# Patient Record
Sex: Female | Born: 1975
Health system: Southern US, Community
[De-identification: ages and names within clinical notes are randomized; demographics above are authoritative.]

## PROBLEM LIST (undated history)

## (undated) ENCOUNTER — Emergency Department (HOSPITAL_COMMUNITY): Admission: EM | Discharge status: Absent without leave | Payer: BLUE CROSS/BLUE SHIELD

## (undated) DIAGNOSIS — F32A Depression, unspecified: Secondary | ICD-10-CM

## (undated) DIAGNOSIS — F329 Major depressive disorder, single episode, unspecified: Secondary | ICD-10-CM

## (undated) DIAGNOSIS — R87619 Unspecified abnormal cytological findings in specimens from cervix uteri: Secondary | ICD-10-CM

## (undated) DIAGNOSIS — F419 Anxiety disorder, unspecified: Secondary | ICD-10-CM

## (undated) HISTORY — DX: Major depressive disorder, single episode, unspecified: F32.9

## (undated) HISTORY — DX: Depression, unspecified: F32.A

## (undated) HISTORY — PX: COLPOSCOPY: SHX161

## (undated) HISTORY — DX: Unspecified abnormal cytological findings in specimens from cervix uteri: R87.619

## (undated) HISTORY — PX: TUBAL LIGATION: SHX77

## (undated) HISTORY — DX: Anxiety disorder, unspecified: F41.9

---

## 1997-12-01 ENCOUNTER — Emergency Department (HOSPITAL_COMMUNITY): Admission: EM | Admit: 1997-12-01 | Discharge: 1997-12-01 | Payer: Self-pay | Admitting: Emergency Medicine

## 1998-11-04 ENCOUNTER — Emergency Department (HOSPITAL_COMMUNITY): Admission: EM | Admit: 1998-11-04 | Discharge: 1998-11-04 | Payer: Self-pay | Admitting: *Deleted

## 1999-03-24 ENCOUNTER — Other Ambulatory Visit: Admission: RE | Admit: 1999-03-24 | Discharge: 1999-03-24 | Payer: Self-pay | Admitting: Internal Medicine

## 1999-11-03 ENCOUNTER — Other Ambulatory Visit: Admission: RE | Admit: 1999-11-03 | Discharge: 1999-11-03 | Payer: Self-pay | Admitting: Obstetrics and Gynecology

## 2000-05-14 ENCOUNTER — Observation Stay (HOSPITAL_COMMUNITY): Admission: AD | Admit: 2000-05-14 | Discharge: 2000-05-15 | Payer: Self-pay | Admitting: Obstetrics and Gynecology

## 2000-05-15 ENCOUNTER — Encounter: Payer: Self-pay | Admitting: Obstetrics and Gynecology

## 2000-06-04 ENCOUNTER — Inpatient Hospital Stay (HOSPITAL_COMMUNITY): Admission: AD | Admit: 2000-06-04 | Discharge: 2000-06-04 | Payer: Self-pay | Admitting: Obstetrics and Gynecology

## 2000-06-05 ENCOUNTER — Inpatient Hospital Stay (HOSPITAL_COMMUNITY): Admission: AD | Admit: 2000-06-05 | Discharge: 2000-06-05 | Payer: Self-pay | Admitting: Obstetrics and Gynecology

## 2000-06-08 ENCOUNTER — Inpatient Hospital Stay (HOSPITAL_COMMUNITY): Admission: AD | Admit: 2000-06-08 | Discharge: 2000-06-10 | Payer: Self-pay | Admitting: Obstetrics and Gynecology

## 2001-03-09 ENCOUNTER — Other Ambulatory Visit: Admission: RE | Admit: 2001-03-09 | Discharge: 2001-03-09 | Payer: Self-pay | Admitting: Family Medicine

## 2001-10-29 ENCOUNTER — Inpatient Hospital Stay (HOSPITAL_COMMUNITY): Admission: AD | Admit: 2001-10-29 | Discharge: 2001-10-29 | Payer: Self-pay | Admitting: Obstetrics and Gynecology

## 2001-10-31 ENCOUNTER — Inpatient Hospital Stay (HOSPITAL_COMMUNITY): Admission: AD | Admit: 2001-10-31 | Discharge: 2001-10-31 | Payer: Self-pay | Admitting: Obstetrics and Gynecology

## 2002-04-25 ENCOUNTER — Other Ambulatory Visit: Admission: RE | Admit: 2002-04-25 | Discharge: 2002-04-25 | Payer: Self-pay | Admitting: *Deleted

## 2003-01-09 ENCOUNTER — Inpatient Hospital Stay (HOSPITAL_COMMUNITY): Admission: AD | Admit: 2003-01-09 | Discharge: 2003-01-09 | Payer: Self-pay | Admitting: Obstetrics & Gynecology

## 2003-10-01 ENCOUNTER — Emergency Department (HOSPITAL_COMMUNITY): Admission: EM | Admit: 2003-10-01 | Discharge: 2003-10-01 | Payer: Self-pay | Admitting: Emergency Medicine

## 2003-10-02 ENCOUNTER — Emergency Department (HOSPITAL_COMMUNITY): Admission: EM | Admit: 2003-10-02 | Discharge: 2003-10-02 | Payer: Self-pay | Admitting: Emergency Medicine

## 2003-11-10 ENCOUNTER — Inpatient Hospital Stay (HOSPITAL_COMMUNITY): Admission: AD | Admit: 2003-11-10 | Discharge: 2003-11-10 | Payer: Self-pay | Admitting: Obstetrics and Gynecology

## 2004-02-25 ENCOUNTER — Emergency Department (HOSPITAL_COMMUNITY): Admission: EM | Admit: 2004-02-25 | Discharge: 2004-02-25 | Payer: Self-pay | Admitting: Emergency Medicine

## 2004-03-22 ENCOUNTER — Emergency Department (HOSPITAL_COMMUNITY): Admission: EM | Admit: 2004-03-22 | Discharge: 2004-03-22 | Payer: Self-pay | Admitting: Emergency Medicine

## 2005-01-23 ENCOUNTER — Inpatient Hospital Stay (HOSPITAL_COMMUNITY): Admission: AD | Admit: 2005-01-23 | Discharge: 2005-01-23 | Payer: Self-pay | Admitting: Family Medicine

## 2005-01-25 ENCOUNTER — Inpatient Hospital Stay (HOSPITAL_COMMUNITY): Admission: AD | Admit: 2005-01-25 | Discharge: 2005-01-25 | Payer: Self-pay | Admitting: *Deleted

## 2005-09-14 ENCOUNTER — Ambulatory Visit: Payer: Self-pay | Admitting: Obstetrics & Gynecology

## 2005-10-11 ENCOUNTER — Inpatient Hospital Stay (HOSPITAL_COMMUNITY): Admission: AD | Admit: 2005-10-11 | Discharge: 2005-10-11 | Payer: Self-pay | Admitting: Obstetrics and Gynecology

## 2005-11-10 ENCOUNTER — Ambulatory Visit: Payer: Self-pay | Admitting: Obstetrics & Gynecology

## 2005-11-13 ENCOUNTER — Inpatient Hospital Stay (HOSPITAL_COMMUNITY): Admission: AD | Admit: 2005-11-13 | Discharge: 2005-11-13 | Payer: Self-pay | Admitting: Obstetrics & Gynecology

## 2005-11-23 ENCOUNTER — Ambulatory Visit: Payer: Self-pay | Admitting: Obstetrics & Gynecology

## 2005-11-23 ENCOUNTER — Encounter (INDEPENDENT_AMBULATORY_CARE_PROVIDER_SITE_OTHER): Payer: Self-pay | Admitting: Specialist

## 2006-01-30 ENCOUNTER — Inpatient Hospital Stay (HOSPITAL_COMMUNITY): Admission: AD | Admit: 2006-01-30 | Discharge: 2006-01-30 | Payer: Self-pay | Admitting: Gynecology

## 2007-09-02 ENCOUNTER — Emergency Department (HOSPITAL_COMMUNITY): Admission: EM | Admit: 2007-09-02 | Discharge: 2007-09-02 | Payer: Self-pay | Admitting: Family Medicine

## 2007-09-05 ENCOUNTER — Inpatient Hospital Stay (HOSPITAL_COMMUNITY): Admission: AD | Admit: 2007-09-05 | Discharge: 2007-09-05 | Payer: Self-pay | Admitting: Obstetrics and Gynecology

## 2007-11-16 ENCOUNTER — Inpatient Hospital Stay (HOSPITAL_COMMUNITY): Admission: AD | Admit: 2007-11-16 | Discharge: 2007-11-16 | Payer: Self-pay | Admitting: Obstetrics and Gynecology

## 2007-12-04 ENCOUNTER — Inpatient Hospital Stay (HOSPITAL_COMMUNITY): Admission: AD | Admit: 2007-12-04 | Discharge: 2007-12-08 | Payer: Self-pay | Admitting: Obstetrics and Gynecology

## 2007-12-05 ENCOUNTER — Encounter (INDEPENDENT_AMBULATORY_CARE_PROVIDER_SITE_OTHER): Payer: Self-pay | Admitting: Obstetrics and Gynecology
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
## 2010-08-10 NOTE — Op Note (Signed)
NAMEJESSAMY, TOROSYAN                ACCOUNT NO.:  [id number]   MEDICAL RECORD NO.:  [id number]          PATIENT TYPE:  INP   LOCATION:  9132                          [facility]   PHYSICIAN:  Crist Fat. Rivard, M.D. DATE OF BIRTH:  05-Jul-1975   DATE OF PROCEDURE:  12/05/2007  DATE OF DISCHARGE:                               OPERATIVE REPORT   PREOPERATIVE DIAGNOSES:  1. Intrauterine pregnancy at 38 weeks and 3 days.  2. Oligohydramnios.  3. Failure to progress.  4. Nonreassuring fetal heart rate and desire for sterilization.   POSTOPERATIVE DIAGNOSES:  1. Intrauterine pregnancy at 38 weeks and 3 days.  2. Oligohydramnios.  3. Failure to progress.  4. Nonreassuring fetal heart rate and desire for sterilization.   ANESTHESIA:  Epidural, Dr. Arby Barrette   PROCEDURE:  Primary low transverse cesarean section plus bilateral tubal  ligation.   SURGEON:  Crist Fat. Rivard, MD   ASSISTANT:  Wynelle Bourgeois, certified nurse midwife.   ESTIMATED BLOOD LOSS:  800 mL.   PROCEDURE:  After being informed of the planned procedure with possible  complications including bleeding, infection, injury to bowel, bladder or  ureters as well as irreversibility and failure of sterilization of 1 in  500, informed consent is obtained.  The patient is taken to OR #1 and  preexisting epidural anesthesia is reinforced.  The patient is placed in  the dorsal decubitus position, pelvis tilted to the left and baby is  monitored while we prepare for C-section and heart rate has recuperated  in the 120.  The patient is then prepped and draped in a sterile fashion  and a Foley catheter is already in her bladder.  After assessing  adequate level of anesthesia, we infiltrated the suprapubic area using  Marcaine 0.25%, 20 mL.  We then perform a Pfannenstiel incision which  was brought down sharply to the fascia.  The fascia is incised in a low  transverse fashion, linea alba is dissected, and peritoneum is entered  in the midline fashion.  The visceral peritoneum is entered in low  transverse fashion allowing [place] to safely retract bladder by developing a  bladder flap.  Alexis retractor is easily placed and we can perform a  uterine incision in a low transverse fashion first with knife and then  extended bluntly.  Amniotic fluid is clear.  We assist the birth of a  female infant at 9:16 p.m. in LOA presentation.  Mouth and nose were  suctioned.  Baby is delivered.  Cord is clamped with two Kelly clamps  and sectioned and the baby is given to Dr. Katrinka Blazing, neonatologist present  in the room.  Placenta is allowed to deliver spontaneously.  The  complete cord has three vessels.  It is sent for cord blood donation and  then to pathology for oligohydramnios.  Uterine revision is negative.   The uterus was then closed in two layers first with a running lock  suture of 0 Vicryl, then with a Lembert suture of 0 Vicryl imbricating  the first one.  Hemostasis is completed in the right angle with three  figure-of-eight stitches of 0 Vicryl and with cauterization on  peritoneal edges.  Both paracolic gutters are cleaned.  Both tubes and  ovaries are assessed and normal and the pelvis was profusely irrigated  with warm saline to note a satisfactory hemostasis.  We then proceeded  with our tubal ligation, first on the left then on the right in the same  fashion:  The tube is initially grasped with 2 Babcock forceps.  Mesosalpinx was entered with cauterization.  Each tube is doubly ligated  in its proximal section and its distal section with 0 chromic.  A  section of 1.5 cm of isthmal ampullary tube is then sectioned out and  each stump is cauterized.  Hemostasis is adequate.  We profusely  irrigated the pelvis again with warm saline and noticed satisfactory  hemostasis.  Retractors are removed and under fascia hemostasis is  completed with cautery.  The fascia is closed with 2 running sutures of  1 Vicryl meeting  midline.  The wound is irrigated with warm saline and  hemostasis is completed with cautery.  The skin is closed with  subcuticular suture of 3-0 Monocryl and Steri-Strips.   Instrument and sponge count is complete x2.  Estimated blood loss is 800  mL.  The procedure was very well tolerated by the patient who is taken  to recovery room in a well and stable condition.   SPECIMEN:  Placenta and cord sent to cord blood donation then to  pathology and segment of right and left tube sent to pathology.   Little boy named Fritzi Mandes was born at 9:16 p.m., received an Apgar of 9  at 1 minute and 9 at 5-minute and weigh 8 pounds 3 ounces.      Crist Fat Rivard, M.D.  Electronically Signed     SAR/MEDQ  D:  12/05/2007  T:  12/06/2007  Job:  [id number]

## 2010-08-10 NOTE — Discharge Summary (Signed)
NAMESHARON, [name]                ACCOUNT NO.:  [id number]   MEDICAL RECORD NO.:  [id number]          PATIENT TYPE:  INP   LOCATION:  9132                          [facility]   PHYSICIAN:  Crist Fat. Rivard, M.D. DATE OF BIRTH:  1975/06/03   DATE OF ADMISSION:  12/04/2007  DATE OF DISCHARGE:  12/08/2007                               DISCHARGE SUMMARY   ADMISSION DIAGNOSES:  1. Intrauterine pregnancy at 38-2/7th weeks.  2. Oligohydramnios.  3. Induction of labor.   DISCHARGE DIAGNOSES:  1. Intrauterine pregnancy at 6 and 3/7th weeks, delivered.  2. Nonreassuring fetal heart rate tracing.  3. Failure to progress.  4. Desire for sterilization.   PROCEDURES:  1. Primary low transverse cesarean section.  2. Tubal sterilization.   HOSPITAL COURSE:  [name] is a 34 year old gravida 3, para 1-0-1-1,  who was admitted at 38-2/7th weeks gestation for induction of labor due  to oligohydramnios.  The patient is with no history or symptoms of PIH.  The patient's pregnancy remarkable for:  1. History of abnormal Pap.  2. Short stature.  3. Increased body mass index.  4. Desires sterilization.  5. Positive Group B strep.   The patient was admitted to birthing suites.  The patient's labor was  initially started with Foley bulb ripening.  The patient had Foley bulb  placed on the evening of admission December 04, 2007.  Pitocin was  started on December 04, 2007.  Pitocin was started on December 05, 2007.  The patient's labor progressed to  6-7 cm.  The patient's fetal heart  rate with decelerations on December 05, 2007 at approximately 6:45,  which resolved with discontinuation of Pitocin.  However, when Pitocin  was restarted patient with recurrent fetal heart rate decelerations.  At  that time, cervical edema was noted on exam and cervical exam had  regressed  to 5 cm.   The patient was taken for primary low transverse cesarean section and  delivered a viable female infant,  [place] at the 9:16 p.m., December 05, 2007.  Apgars 9 and 9 at 1 and 5 minutes respectively.  Weight 8 pounds  3 ounces.  Surgery was uncomplicated.  Bilateral tubal ligation was also  performed at that time.  Estimated blood loss 800 mL.   The patient's postoperative course was uneventful.  The patient's day  one hemoglobin 8.9 and pre delivery was 10.4.  The patient remained  asymptomatic with anemia.  On postoperative day #3, the patient was  ambulating, voiding, and tolerating solids and liquids without  difficulty.  The patient was breast feeding without difficulty.  It was  felt that the patient had received a full benefit of her hospital stay  and was discharged home.   DISCHARGE INSTRUCTIONS:  Per [facility] handout.   DISCHARGE MEDICATIONS:  1. Motrin 600 mg p.o. q.6 h. p.r.n. pain.  2. Tylox 1-2 tablets p.o. q. 4-6h. p.r.n. pain.  3. Integra Plus iron supplement 1 tablet p.o. daily.   Discharge followup will occur in 6 weeks at [facility].  Jade [name], CNM      ______________________________  Crist Fat Rivard, M.D.    NOS/MEDQ  D:  12/08/2007  T:  12/08/2007  Job:  [id number]

## 2010-08-13 NOTE — Group Therapy Note (Signed)
NAME:  Jade [name], Jade [name] NO.:  [id number]   MEDICAL RECORD NO.:  [id number]          PATIENT TYPE:  WOC   LOCATION:  [facility]                   [facility]   PHYSICIAN:  Elsie Lincoln, MD      DATE OF BIRTH:  May 18, 1975   DATE OF SERVICE:  11/23/2005                                    CLINIC NOTE   The patient is a 35 year old female who presents for reevaluation of  abnormal Pap smear.  The patient has a history of low-grade Pap and biopsy  with an unsatisfactory colposcopy.  It has been six months since her last  Pap smear.  I have talked to the patient in depth today that it is time to  reevaluate possibly.  Since the colposcopy was done at the health department  that I might be able to see more, given my depth of experience.  The patient  agrees to do this conservative approach instead of going to a LEEP.  The  colposcopy at this time was performed, and the entire transformation zone  was seen.  There was one biopsy taken in a small acetowhite area  approximately at 12 o'clock.  She did have a negative pregnancy test prior  to this.  The patient is to come back in two weeks for results, or I will  probably call her actually with the results, given that she has had such a  difficult process getting through our system.           ______________________________  Elsie Lincoln, MD     KL/MEDQ  D:  11/30/2005  T:  12/01/2005  Job:  [id number]

## 2010-08-13 NOTE — Group Therapy Note (Signed)
NAMESHERLEEN, PANGBORN NO.:  [id number]   MEDICAL RECORD NO.:  [id number]          PATIENT TYPE:  WOC   LOCATION:  [facility]                   [facility]   PHYSICIAN:  Elsie Lincoln, MD      DATE OF BIRTH:  1976-03-18   DATE OF SERVICE:                                    CLINIC NOTE   CORRECTION:  The patient is a 35 year old female who presents for re-  evaluation of abnormal Pap smear.  The patient has a history of a low grade  Pap and biopsy with unsatisfactory colposcopy and it has been six months  since her last Pap smear.  I talked to the patient in depth today, that it  is time to re-evaluate possibly.  Since colposcopy was done at the health  department, I think I might be able to see more given my depth of  experience.  The patient agrees to do this conservative approach instead of  going to LEEP.  The colposcopy at the time was performed and the entire T-  zone was seen.  There was no biopsy taken because there was no abnormal area  seen.  A Pap smear was done.  I will call the patient with the results.           ______________________________  Elsie Lincoln, MD     KL/MEDQ  D:  12/14/2005  T:  12/15/2005  Job:  [id number]
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
## 2010-08-13 NOTE — H&P (Signed)
[facility]  Patient:    Jade [name], Jade [name]                         MRN: [id number] Adm. Date:  [id number] Disc. Date: [id number] Attending:  Shaune Spittle Dictator:   Miguel Dibble, C.N.M.                         History and Physical  DATE OF BIRTH:  1975-08-24.  BRIEF HISTORY:  This is a 35 year old African-American female prima gravida at 61 and 3/[redacted] weeks pregnant who has been having contractions since 2 a.m. ( approximately 13 hours ago).  She has ambulated since be checked in the office and has gone from 1 to 2 cm and 80% effaced at approximately 11:30 a.m. to 3-4 cm 95% effaced, vertex -1 with marked bloody show at approximately 1500 hours. She will be admitted for labor management.  LABORATORY DATA:  Her prenatal labs are as follows:  hemoglobin 13.5, hematocrit 39.4, platelets 274, blood type and Rh 0+, Rh antibody negative, sickle cell trail negative.  VDRL nonreactive.  Rubella titer immune. Hepatitis B surface antigen negative, HIV nonreactive.  Pap smear within normal limits.  Gonorrhea and chlamydia cultures negative.  Maternal serum alpha protein within normal limits.  Glucose challenge test at 28 weeks is 104, group B streptococcus at 36 weeks is negative.  This pregnancy was uncomplicated.  She received Procardia for a short time for signs of early preterm labor that subsided quickly.  MEDICAL HISTORY:  No known drug allergies.  Usual childhood diseases.  Wisdom tooth extraction in 1999.  FAMILY HISTORY:  Maternal uncle with polycystic kidney disease as well as maternal grandfather with polycystic kidney disease.  Mother with cocaine abuse. Genetic history is negative.  SOCIAL HISTORY:  African-American female, single, father of the baby is Annamaria Boots who is present and supportive.  The patient has received 1 year of college.  She is currently unemployed.  Father of the baby has had 1 year of college.  Works for [employer]  full time. Stable monogamous relationship.  Denies smoking, alcohol or drug abuse.  PHYSICAL EXAMINATION:  HEENT:  Within normal limits.  LUNGS:  Bilaterally clear.  HEART:  Regular rate and rhythm.  ABDOMEN:  Soft, nontender.  Contractions every 2-3 minutes.  Fetal heart rate is reactive and reassuring.  CERVIX:  Cervix is 3-4 cm, 95% effaced, vertex at -1 with bloody show.  Intact bag of waters.  EXTREMITIES:  Trace pedal edema.  DTRs +1.  ASSESSMENT:  Prima gravida, post dates and early active phase labor.  Negative group B streptococcus.  PLAN:  Admit to Labor and Delivery.  Routine [facility] orders. Analgesia.  Anticipate normal spontaneous vaginal delivery.  Her ultrasound confirmed VDC of May 29, 2000. DD:  06/08/00 TD:  06/08/00 Job: [id number]

## 2010-11-08 ENCOUNTER — Emergency Department (HOSPITAL_COMMUNITY)
Admission: EM | Admit: 2010-11-08 | Discharge: 2010-11-08 | Disposition: A | Discharge status: Home / Self Care | Payer: Self-pay | Attending: Emergency Medicine | Admitting: Emergency Medicine

## 2010-11-08 DIAGNOSIS — R209 Unspecified disturbances of skin sensation: Secondary | ICD-10-CM | POA: Insufficient documentation

## 2010-12-23 LAB — URINALYSIS, ROUTINE W REFLEX MICROSCOPIC
Bilirubin Urine: NEGATIVE
Glucose, UA: NEGATIVE
Hgb urine dipstick: NEGATIVE
Ketones, ur: NEGATIVE
Nitrite: NEGATIVE
Protein, ur: NEGATIVE
Specific Gravity, Urine: 1.01
Urobilinogen, UA: 0.2
pH: 7.5

## 2010-12-29 LAB — CBC
HCT: 27.5 — ABNORMAL LOW
HCT: 32.2 — ABNORMAL LOW
Hemoglobin: 10.4 — ABNORMAL LOW
MCHC: 32.4
MCHC: 32.5
MCV: 83.9
Platelets: 111 — ABNORMAL LOW
RDW: 14.6
RDW: 14.7

## 2011-08-01 ENCOUNTER — Encounter: Payer: Self-pay | Admitting: Advanced Practice Midwife

## 2011-08-01 ENCOUNTER — Ambulatory Visit (INDEPENDENT_AMBULATORY_CARE_PROVIDER_SITE_OTHER): Payer: Self-pay | Admitting: Advanced Practice Midwife

## 2011-08-01 VITALS — BP 111/73 | HR 86 | Temp 98.7°F | Ht 61.0 in | Wt 204.1 lb

## 2011-08-01 DIAGNOSIS — Z87898 Personal history of other specified conditions: Secondary | ICD-10-CM

## 2011-08-01 DIAGNOSIS — N940 Mittelschmerz: Secondary | ICD-10-CM

## 2011-08-01 DIAGNOSIS — R87613 High grade squamous intraepithelial lesion on cytologic smear of cervix (HGSIL): Secondary | ICD-10-CM | POA: Insufficient documentation

## 2011-08-01 DIAGNOSIS — Z124 Encounter for screening for malignant neoplasm of cervix: Secondary | ICD-10-CM

## 2011-08-01 DIAGNOSIS — R1031 Right lower quadrant pain: Secondary | ICD-10-CM

## 2011-08-01 DIAGNOSIS — Z01419 Encounter for gynecological examination (general) (routine) without abnormal findings: Secondary | ICD-10-CM

## 2011-08-01 DIAGNOSIS — Z8742 Personal history of other diseases of the female genital tract: Secondary | ICD-10-CM

## 2011-08-01 LAB — POCT URINALYSIS DIP (DEVICE)
Bilirubin Urine: NEGATIVE
Glucose, UA: NEGATIVE mg/dL
Nitrite: NEGATIVE
pH: 8.5 — ABNORMAL HIGH (ref 5.0–8.0)

## 2011-08-01 LAB — CBC
MCHC: 33 g/dL (ref 30.0–36.0)
Platelets: 270 10*3/uL (ref 150–400)
RDW: 13 % (ref 11.5–15.5)

## 2011-08-01 NOTE — Progress Notes (Signed)
States here for annual checkup/pap smear . States has history abnormal papsmear and colposcopy, and follow up pap was normal.  Also c/o pain right lower side x 4 days=6.

## 2011-08-01 NOTE — Progress Notes (Signed)
Subjective:     Jade [name] is a 36 y.o. woman who comes in today for a  pap smear only. Her most recent annual exam was 1 year ago. Her most recent Pap smear was one year ago and showed no abnormalities per pt. Previous abnormal Pap smears: yes - CIN II w/ normal colpo per pt.  Contraception: tubal ligation Patient's last menstrual period was 07/17/2011.   The pt reports RLQ pain x 4 days, increased clear discharge and possible light spotting. She denies fever, chills, N/V/D/C, loss of appetite, dysuria, dyspareunia, frequency, flank pain or hematuria. She has not tried anything for the pain.  The following portions of the patient's history were reviewed and updated as appropriate: allergies, current medications, past family history, past medical history, past social history, past surgical history and problem list.  Review of Systems Pertinent items are noted in HPI.   Objective:    BP 111/73  Pulse 86  Temp 98.7 F (37.1 C)  Ht 5\' 1"  (1.549 m)  Wt 204 lb 1.6 oz (92.579 kg)  BMI 38.56 kg/m2  LMP 07/17/2011 GENERAL: NAD, A&OX4 ABD: Soft, mild tenderness in right groin extending cephalad. No rebound, guarding or masses. Normal BS. No CVAT. Pelvic Exam: cervix normal in appearance, exam obscured by obesity, external genitalia normal, no bladder tenderness, no cervical motion tenderness, positive findings: Nabothian cyst(s) or mild right adnexal tenderness and clear, odorless discharge C/W ovulation. and no bleeding, vervix non-friable. Pap smear obtained.   Assessment:   1. Cervical cancer screening  Cytology - PAP  2. RLQ abdominal pain (low suspicion for appendicitis) CBC, POCT urine pregnancy, GC/CT, UA  3. History of abnormal Pap smear     4. Mittelschmerz      Plan:    Follow up in 1 year, or as indicated by Pap results.  Ibuprofen 600 mg PO Q6 PRN If no relief of Sx in 2 days and no other explanation of pain will order pelvic US. If Sx worsen and/or fever, GI Sx develop,  go to ED.  Dorathy Kinsman 08/01/2011 3:40 PM

## 2011-08-01 NOTE — Patient Instructions (Signed)
Ibuprofen 600 mg every 6 hours  Mittelschmerz  Mittelschmerz is lower abdominal pain that happens between menstrual periods. Mittelschmerz is a [place] word that means "middle pain." It may occur right before, during, or after ovulation. It is usually felt on either the right or left side, depending on which ovary is passing the egg.  CAUSES  Pain may be felt when:  There is irritation (inflammation) inside the abdomen. This is caused by the small amount of blood or fluid that may come from releasing the egg.   The covering of the ovary stretches.   Ovarian cysts develop.   You have endometriosis. This is when the uterine lining tissue grows outside of the uterus.   You have endometriomas. These are cysts that are formed by endometrial tissue.  SYMPTOMS  Pain may be:  One-sided pain unless both ovaries are ovulating at the same time. If both ovaries are ovulating, there may be pain on both sides. This pain is often repeated every month. At times, there may be a month or two with no pain.   Dull, cramping, or sharp.   Short-lived or last up to 24 to 48 hours.   Felt with bowel movements, diarrhea, or intercourse.   Accompanied by a slight amount of vaginal bleeding.  DIAGNOSIS   Your caregiver will take a history and do a physical exam.   Blood tests and abdominal ultrasounds may be performed if the problem continues, becomes worse, or does not respond to the usual treatment.   A thin, lighted tube may be put into your abdomen (laparoscopy) to check for problems if the pain gets worse or does not go away.  TREATMENT  Usually, no treatment is needed. If treatment is needed, it may include:  Taking over-the-counter pain relievers.   Taking birth control pills (oral contraceptives). This may be used to stop ovulation.   Medical or surgical treatment if you have endometriomas.  Together, you and your caregiver can decide which course of treatment is best for you. HOME CARE  INSTRUCTIONS   Only take over-the-counter or prescription medicines for pain, discomfort, or fever as directed by your caregiver. Do not use aspirin. Aspirin may increase bleeding.   Write down when the pain comes in relation to your menstrual period. Write down how bad it is, if you have a fever with the pain, and how long it lasts.  SEEK MEDICAL CARE IF:   Your pain increases and is not controlled with medicine.   Your pain is on both sides of your abdomen.   You develop vaginal bleeding (more than just spotting) with the pain.   You have a fever.   You develop nausea or vomiting.   You feel lightheaded or faint.  MAKE SURE YOU:   Understand these instructions.   Will watch your condition.   Will get help right away if you are not doing well or get worse.  Document Released: 03/04/2002 Document Revised: 03/03/2011 Document Reviewed: 06/11/2010 [facility] Patient Information 2012 [place], [place].

## 2011-08-03 ENCOUNTER — Telehealth: Payer: Self-pay | Admitting: *Deleted

## 2011-08-03 NOTE — Telephone Encounter (Signed)
Spoke with patient advised her that her cbc was normal. And pap smear results are not back

## 2011-08-03 NOTE — Telephone Encounter (Signed)
Pt left a message requesting her results. 

## 2011-08-03 NOTE — Telephone Encounter (Signed)
Returned patients call no answer, left her a message that we are returning her call.

## 2011-08-09 ENCOUNTER — Telehealth: Payer: Self-pay | Admitting: *Deleted

## 2011-08-09 NOTE — Telephone Encounter (Signed)
Message copied by Jill Side on Tue Aug 09, 2011  3:23 PM ------      Message from: Raynald Blend      Created: Tue Aug 09, 2011 11:56 AM       Appointment 06/13 @ 1:45                        ----- Message -----         From: Dorathy Kinsman, CNM         Sent: 08/09/2011   8:37 AM           To: [facility] Admin Pool            Pap ASCUS pos HRHPV. Needs colpo.

## 2011-08-09 NOTE — Telephone Encounter (Signed)
Called pt and left message to call back regarding results of Pap and follow up appt.

## 2011-08-10 NOTE — Telephone Encounter (Signed)
Called pt and left message to return our call to the clinics.  

## 2011-08-11 NOTE — Telephone Encounter (Signed)
Pt called the front desk and Erie Noe transferred the call.  The pt stated that she was returning our call.  I informed the pt of abnormal pap results with HPV detected pt stated that she has had abnormal pap before and that we were scheduling her for a colposcopy.  Pt stated that she did not have a pen and paper but I could call and leave the appt time on her voicemail, 09/08/11@ 145pm.  I called and left appt time on voicemail along with contact # if the appt is not in her convenience.

## 2011-08-11 NOTE — Telephone Encounter (Signed)
Called pt and left message to return our call to the clinics.  

## 2011-08-11 NOTE — Telephone Encounter (Signed)
Called patient at her mobile and home numbers and left messages requesting a call back regarding an upcoming appt.

## 2011-09-08 ENCOUNTER — Encounter: Payer: Self-pay | Admitting: Physician Assistant

## 2011-10-07 ENCOUNTER — Encounter: Payer: Self-pay | Admitting: Obstetrics & Gynecology

## 2012-09-21 ENCOUNTER — Ambulatory Visit (INDEPENDENT_AMBULATORY_CARE_PROVIDER_SITE_OTHER): Payer: Self-pay | Admitting: Obstetrics and Gynecology

## 2012-09-21 ENCOUNTER — Encounter: Payer: Self-pay | Admitting: Obstetrics and Gynecology

## 2012-09-21 VITALS — BP 102/68 | HR 70 | Temp 98.1°F | Ht 61.0 in | Wt 203.4 lb

## 2012-09-21 DIAGNOSIS — N760 Acute vaginitis: Secondary | ICD-10-CM

## 2012-09-21 DIAGNOSIS — Z01419 Encounter for gynecological examination (general) (routine) without abnormal findings: Secondary | ICD-10-CM

## 2012-09-21 NOTE — Progress Notes (Signed)
  Subjective:     Jade [name] is a 37 y.o. female [phone] with LMP 09/12/2012 and BMI 38 who is here for a comprehensive physical exam. The patient reports malodorous vaginal discharge which has been present for the past month. She denies any pruritis. Patient reports that she was not aware of her last pap smear being abnormal (ASCUS +HPV). Patient otherwise without any complaints. She is sexually active using BTL for contraception.  Past Medical History  Diagnosis Date  . Abnormal Pap smear of cervix    Past Surgical History  Procedure Laterality Date  . Colposcopy    . Cesarean section    . Tubal ligation     Family History  Problem Relation Age of Onset  . Hypertension Mother   . Diabetes Mother    History  Substance Use Topics  . Smoking status: Never Smoker   . Smokeless tobacco: Never Used  . Alcohol Use: No    History   Social History  . Marital Status: Single    Spouse Name: N/A    Number of Children: N/A  . Years of Education: N/A   Occupational History  . Not on file.   Social History Main Topics  . Smoking status: Never Smoker   . Smokeless tobacco: Never Used  . Alcohol Use: No  . Drug Use: No  . Sexually Active: Yes    Birth Control/ Protection: Surgical   Other Topics Concern  . Not on file   Social History Narrative  . No narrative on file   Health Maintenance  Topic Date Due  . Tetanus/tdap  08/19/1996  . Influenza Vaccine  11/26/2012  . Pap Smear  08/01/2014       Review of Systems A comprehensive review of systems was negative.   Objective:      GENERAL: Well-developed, well-nourished female in no acute distress.  HEENT: Normocephalic, atraumatic. Sclerae anicteric.  NECK: Supple. Normal thyroid.  LUNGS: Clear to auscultation bilaterally.  HEART: Regular rate and rhythm. BREASTS: Symmetric in size. No palpable masses or lymphadenopathy, skin changes, or nipple drainage. ABDOMEN: Soft, nontender, nondistended. No  organomegaly. PELVIC: Normal external female genitalia. Vagina is pink and rugated.  Normal discharge. Normal appearing cervix. Uterus is 8-weeks in size. No adnexal mass or tenderness. EXTREMITIES: No cyanosis, clubbing, or edema, 2+ distal pulses.    Assessment:    Healthy female exam.      Plan:    pap smear collected and wet prep - Patient advised to perform monthly self breast and vulva exams - Patient advised to exercise regularly (150 minutes/week) and start low fat high fiber diet to help with weight loss - Patient will be contacted with any abnormal results - RTC in 1 year or prn See After Visit Summary for Counseling Recommendations

## 2012-09-21 NOTE — Patient Instructions (Signed)
Preventive Care for Adults, Female A healthy lifestyle and preventive care can promote health and wellness. Preventive health guidelines for women include the following key practices.  A routine yearly physical is a good way to check with your caregiver about your health and preventive screening. It is a chance to share any concerns and updates on your health, and to receive a thorough exam.  Visit your dentist for a routine exam and preventive care every 6 months. Brush your teeth twice a day and floss once a day. Good oral hygiene prevents tooth decay and gum disease.  The frequency of eye exams is based on your age, health, family medical history, use of contact lenses, and other factors. Follow your caregiver's recommendations for frequency of eye exams.  Eat a healthy diet. Foods like vegetables, fruits, whole grains, low-fat dairy products, and lean protein foods contain the nutrients you need without too many calories. Decrease your intake of foods high in solid fats, added sugars, and salt. Eat the right amount of calories for you.Get information about a proper diet from your caregiver, if necessary.  Regular physical exercise is one of the most important things you can do for your health. Most adults should get at least 150 minutes of moderate-intensity exercise (any activity that increases your heart rate and causes you to sweat) each week. In addition, most adults need muscle-strengthening exercises on 2 or more days a week.  Maintain a healthy weight. The body mass index (BMI) is a screening tool to identify possible weight problems. It provides an estimate of body fat based on height and weight. Your caregiver can help determine your BMI, and can help you achieve or maintain a healthy weight.For adults 20 years and older:  A BMI below 18.5 is considered underweight.  A BMI of 18.5 to 24.9 is normal.  A BMI of 25 to 29.9 is considered overweight.  A BMI of 30 and above is  considered obese.  Maintain normal blood lipids and cholesterol levels by exercising and minimizing your intake of saturated fat. Eat a balanced diet with plenty of fruit and vegetables. Blood tests for lipids and cholesterol should begin at age 20 and be repeated every 5 years. If your lipid or cholesterol levels are high, you are over 50, or you are at high risk for heart disease, you may need your cholesterol levels checked more frequently.Ongoing high lipid and cholesterol levels should be treated with medicines if diet and exercise are not effective.  If you smoke, find out from your caregiver how to quit. If you do not use tobacco, do not start.  If you are pregnant, do not drink alcohol. If you are breastfeeding, be very cautious about drinking alcohol. If you are not pregnant and choose to drink alcohol, do not exceed 1 drink per day. One drink is considered to be 12 ounces (355 mL) of beer, 5 ounces (148 mL) of wine, or 1.5 ounces (44 mL) of liquor.  Avoid use of street drugs. Do not share needles with anyone. Ask for help if you need support or instructions about stopping the use of drugs.  High blood pressure causes heart disease and increases the risk of stroke. Your blood pressure should be checked at least every 1 to 2 years. Ongoing high blood pressure should be treated with medicines if weight loss and exercise are not effective.  If you are 55 to 35 years old, ask your caregiver if you should take aspirin to prevent strokes.  Diabetes   screening involves taking a blood sample to check your fasting blood sugar level. This should be done once every 3 years, after age 45, if you are within normal weight and without risk factors for diabetes. Testing should be considered at a younger age or be carried out more frequently if you are overweight and have at least 1 risk factor for diabetes.  Breast cancer screening is essential preventive care for women. You should practice "breast  self-awareness." This means understanding the normal appearance and feel of your breasts and may include breast self-examination. Any changes detected, no matter how small, should be reported to a caregiver. Women in their 20s and 30s should have a clinical breast exam (CBE) by a caregiver as part of a regular health exam every 1 to 3 years. After age 40, women should have a CBE every year. Starting at age 40, women should consider having a mammography (breast X-ray test) every year. Women who have a family history of breast cancer should talk to their caregiver about genetic screening. Women at a high risk of breast cancer should talk to their caregivers about having magnetic resonance imaging (MRI) and a mammography every year.  The Pap test is a screening test for cervical cancer. A Pap test can show cell changes on the cervix that might become cervical cancer if left untreated. A Pap test is a procedure in which cells are obtained and examined from the lower end of the uterus (cervix).  Women should have a Pap test starting at age 21.  Between ages 21 and 29, Pap tests should be repeated every 2 years.  Beginning at age 30, you should have a Pap test every 3 years as long as the past 3 Pap tests have been normal.  Some women have medical problems that increase the chance of getting cervical cancer. Talk to your caregiver about these problems. It is especially important to talk to your caregiver if a new problem develops soon after your last Pap test. In these cases, your caregiver may recommend more frequent screening and Pap tests.  The above recommendations are the same for women who have or have not gotten the vaccine for human papillomavirus (HPV).  If you had a hysterectomy for a problem that was not cancer or a condition that could lead to cancer, then you no longer need Pap tests. Even if you no longer need a Pap test, a regular exam is a good idea to make sure no other problems are  starting.  If you are between ages 65 and 70, and you have had normal Pap tests going back 10 years, you no longer need Pap tests. Even if you no longer need a Pap test, a regular exam is a good idea to make sure no other problems are starting.  If you have had past treatment for cervical cancer or a condition that could lead to cancer, you need Pap tests and screening for cancer for at least 20 years after your treatment.  If Pap tests have been discontinued, risk factors (such as a new sexual partner) need to be reassessed to determine if screening should be resumed.  The HPV test is an additional test that may be used for cervical cancer screening. The HPV test looks for the virus that can cause the cell changes on the cervix. The cells collected during the Pap test can be tested for HPV. The HPV test could be used to screen women aged 30 years and older, and should   be used in women of any age who have unclear Pap test results. After the age of 30, women should have HPV testing at the same frequency as a Pap test.  Colorectal cancer can be detected and often prevented. Most routine colorectal cancer screening begins at the age of 50 and continues through age 75. However, your caregiver may recommend screening at an earlier age if you have risk factors for colon cancer. On a yearly basis, your caregiver may provide home test kits to check for hidden blood in the stool. Use of a small camera at the end of a tube, to directly examine the colon (sigmoidoscopy or colonoscopy), can detect the earliest forms of colorectal cancer. Talk to your caregiver about this at age 50, when routine screening begins. Direct examination of the colon should be repeated every 5 to 10 years through age 75, unless early forms of pre-cancerous polyps or small growths are found.  Hepatitis C blood testing is recommended for all people born from 1945 through 1965 and any individual with known risks for hepatitis C.  Practice  safe sex. Use condoms and avoid high-risk sexual practices to reduce the spread of sexually transmitted infections (STIs). STIs include gonorrhea, chlamydia, syphilis, trichomonas, herpes, HPV, and human immunodeficiency virus (HIV). Herpes, HIV, and HPV are viral illnesses that have no cure. They can result in disability, cancer, and death. Sexually active women aged 25 and younger should be checked for chlamydia. Older women with new or multiple partners should also be tested for chlamydia. Testing for other STIs is recommended if you are sexually active and at increased risk.  Osteoporosis is a disease in which the bones lose minerals and strength with aging. This can result in serious bone fractures. The risk of osteoporosis can be identified using a bone density scan. Women ages 65 and over and women at risk for fractures or osteoporosis should discuss screening with their caregivers. Ask your caregiver whether you should take a calcium supplement or vitamin D to reduce the rate of osteoporosis.  Menopause can be associated with physical symptoms and risks. Hormone replacement therapy is available to decrease symptoms and risks. You should talk to your caregiver about whether hormone replacement therapy is right for you.  Use sunscreen with sun protection factor (SPF) of 30 or more. Apply sunscreen liberally and repeatedly throughout the day. You should seek shade when your shadow is shorter than you. Protect yourself by wearing long sleeves, pants, a wide-brimmed hat, and sunglasses year round, whenever you are outdoors.  Once a month, do a whole body skin exam, using a mirror to look at the skin on your back. Notify your caregiver of new moles, moles that have irregular borders, moles that are larger than a pencil eraser, or moles that have changed in shape or color.  Stay current with required immunizations.  Influenza. You need a dose every fall (or winter). The composition of the flu vaccine  changes each year, so being vaccinated once is not enough.  Pneumococcal polysaccharide. You need 1 to 2 doses if you smoke cigarettes or if you have certain chronic medical conditions. You need 1 dose at age 65 (or older) if you have never been vaccinated.  Tetanus, diphtheria, pertussis (Tdap, Td). Get 1 dose of Tdap vaccine if you are younger than age 65, are over 65 and have contact with an infant, are a healthcare worker, are pregnant, or simply want to be protected from whooping cough. After that, you need a Td   booster dose every 10 years. Consult your caregiver if you have not had at least 3 tetanus and diphtheria-containing shots sometime in your life or have a deep or dirty wound.  HPV. You need this vaccine if you are a woman age 26 or younger. The vaccine is given in 3 doses over 6 months.  Measles, mumps, rubella (MMR). You need at least 1 dose of MMR if you were born in 1957 or later. You may also need a second dose.  Meningococcal. If you are age 19 to 21 and a first-year college student living in a residence hall, or have one of several medical conditions, you need to get vaccinated against meningococcal disease. You may also need additional booster doses.  Zoster (shingles). If you are age 60 or older, you should get this vaccine.  Varicella (chickenpox). If you have never had chickenpox or you were vaccinated but received only 1 dose, talk to your caregiver to find out if you need this vaccine.  Hepatitis A. You need this vaccine if you have a specific risk factor for hepatitis A virus infection or you simply wish to be protected from this disease. The vaccine is usually given as 2 doses, 6 to 18 months apart.  Hepatitis B. You need this vaccine if you have a specific risk factor for hepatitis B virus infection or you simply wish to be protected from this disease. The vaccine is given in 3 doses, usually over 6 months. Preventive Services / Frequency Ages 19 to 39  Blood  pressure check.** / Every 1 to 2 years.  Lipid and cholesterol check.** / Every 5 years beginning at age 20.  Clinical breast exam.** / Every 3 years for women in their 20s and 30s.  Pap test.** / Every 2 years from ages 21 through 29. Every 3 years starting at age 30 through age 65 or 70 with a history of 3 consecutive normal Pap tests.  HPV screening.** / Every 3 years from ages 30 through ages 65 to 70 with a history of 3 consecutive normal Pap tests.  Hepatitis C blood test.** / For any individual with known risks for hepatitis C.  Skin self-exam. / Monthly.  Influenza immunization.** / Every year.  Pneumococcal polysaccharide immunization.** / 1 to 2 doses if you smoke cigarettes or if you have certain chronic medical conditions.  Tetanus, diphtheria, pertussis (Tdap, Td) immunization. / A one-time dose of Tdap vaccine. After that, you need a Td booster dose every 10 years.  HPV immunization. / 3 doses over 6 months, if you are 26 and younger.  Measles, mumps, rubella (MMR) immunization. / You need at least 1 dose of MMR if you were born in 1957 or later. You may also need a second dose.  Meningococcal immunization. / 1 dose if you are age 19 to 21 and a first-year college student living in a residence hall, or have one of several medical conditions, you need to get vaccinated against meningococcal disease. You may also need additional booster doses.  Varicella immunization.** / Consult your caregiver.  Hepatitis A immunization.** / Consult your caregiver. 2 doses, 6 to 18 months apart.  Hepatitis B immunization.** / Consult your caregiver. 3 doses usually over 6 months. Ages 40 to 64  Blood pressure check.** / Every 1 to 2 years.  Lipid and cholesterol check.** / Every 5 years beginning at age 20.  Clinical breast exam.** / Every year after age 40.  Mammogram.** / Every year beginning at age 40   and continuing for as long as you are in good health. Consult with your  caregiver.  Pap test.** / Every 3 years starting at age 30 through age 65 or 70 with a history of 3 consecutive normal Pap tests.  HPV screening.** / Every 3 years from ages 30 through ages 65 to 70 with a history of 3 consecutive normal Pap tests.  Fecal occult blood test (FOBT) of stool. / Every year beginning at age 50 and continuing until age 75. You may not need to do this test if you get a colonoscopy every 10 years.  Flexible sigmoidoscopy or colonoscopy.** / Every 5 years for a flexible sigmoidoscopy or every 10 years for a colonoscopy beginning at age 50 and continuing until age 75.  Hepatitis C blood test.** / For all people born from 1945 through 1965 and any individual with known risks for hepatitis C.  Skin self-exam. / Monthly.  Influenza immunization.** / Every year.  Pneumococcal polysaccharide immunization.** / 1 to 2 doses if you smoke cigarettes or if you have certain chronic medical conditions.  Tetanus, diphtheria, pertussis (Tdap, Td) immunization.** / A one-time dose of Tdap vaccine. After that, you need a Td booster dose every 10 years.  Measles, mumps, rubella (MMR) immunization. / You need at least 1 dose of MMR if you were born in 1957 or later. You may also need a second dose.  Varicella immunization.** / Consult your caregiver.  Meningococcal immunization.** / Consult your caregiver.  Hepatitis A immunization.** / Consult your caregiver. 2 doses, 6 to 18 months apart.  Hepatitis B immunization.** / Consult your caregiver. 3 doses, usually over 6 months. Ages 65 and over  Blood pressure check.** / Every 1 to 2 years.  Lipid and cholesterol check.** / Every 5 years beginning at age 20.  Clinical breast exam.** / Every year after age 40.  Mammogram.** / Every year beginning at age 40 and continuing for as long as you are in good health. Consult with your caregiver.  Pap test.** / Every 3 years starting at age 30 through age 65 or 70 with a 3  consecutive normal Pap tests. Testing can be stopped between 65 and 70 with 3 consecutive normal Pap tests and no abnormal Pap or HPV tests in the past 10 years.  HPV screening.** / Every 3 years from ages 30 through ages 65 or 70 with a history of 3 consecutive normal Pap tests. Testing can be stopped between 65 and 70 with 3 consecutive normal Pap tests and no abnormal Pap or HPV tests in the past 10 years.  Fecal occult blood test (FOBT) of stool. / Every year beginning at age 50 and continuing until age 75. You may not need to do this test if you get a colonoscopy every 10 years.  Flexible sigmoidoscopy or colonoscopy.** / Every 5 years for a flexible sigmoidoscopy or every 10 years for a colonoscopy beginning at age 50 and continuing until age 75.  Hepatitis C blood test.** / For all people born from 1945 through 1965 and any individual with known risks for hepatitis C.  Osteoporosis screening.** / A one-time screening for women ages 65 and over and women at risk for fractures or osteoporosis.  Skin self-exam. / Monthly.  Influenza immunization.** / Every year.  Pneumococcal polysaccharide immunization.** / 1 dose at age 65 (or older) if you have never been vaccinated.  Tetanus, diphtheria, pertussis (Tdap, Td) immunization. / A one-time dose of Tdap vaccine if you are over   65 and have contact with an infant, are a healthcare worker, or simply want to be protected from whooping cough. After that, you need a Td booster dose every 10 years.  Varicella immunization.** / Consult your caregiver.  Meningococcal immunization.** / Consult your caregiver.  Hepatitis A immunization.** / Consult your caregiver. 2 doses, 6 to 18 months apart.  Hepatitis B immunization.** / Check with your caregiver. 3 doses, usually over 6 months. ** Family history and personal history of risk and conditions may change your caregiver's recommendations. Document Released: 05/10/2001 Document Revised: 06/06/2011  Document Reviewed: 08/09/2010 [employer] Patient Information 2014 [employer].  

## 2012-09-22 LAB — WET PREP, GENITAL
Trich, Wet Prep: NONE SEEN
Yeast Wet Prep HPF POC: NONE SEEN

## 2012-09-23 ENCOUNTER — Other Ambulatory Visit: Payer: Self-pay | Admitting: Obstetrics and Gynecology

## 2012-09-23 MED ORDER — METRONIDAZOLE 500 MG PO TABS: 500.0000 mg | ORAL_TABLET | Freq: Two times a day (BID) | ORAL | Status: DC

## 2012-09-24 ENCOUNTER — Telehealth: Payer: Self-pay | Admitting: *Deleted

## 2012-09-24 NOTE — Telephone Encounter (Signed)
Pt informed

## 2012-09-24 NOTE — Telephone Encounter (Signed)
Message copied by Mannie Stabile on Mon Sep 24, 2012  8:46 AM ------      Message from: Catalina Antigua      Created: Sun Sep 23, 2012  6:07 AM       Please inform patient of positive BV. Flagyl has been e-prescribed            Peggy ------

## 2012-09-25 ENCOUNTER — Telehealth: Payer: Self-pay | Admitting: General Practice

## 2012-09-25 NOTE — Telephone Encounter (Signed)
Called patient and informed her of results and need for colposcopy and that ou July schedule is full at the moment and she should call back in 3 weeks for an appt in August with Dr Jolayne Panther. Patient verbalized understanding to all and had no further questions

## 2012-09-25 NOTE — Telephone Encounter (Signed)
Message copied by Kathee Delton on Tue Sep 25, 2012  2:34 PM ------      Message from: CONSTANT, Gigi Gin      Created: Tue Sep 25, 2012  2:13 PM       Please inform patient of abnormal pap smear and need for colposcopy. ------

## 2012-10-22 ENCOUNTER — Telehealth: Payer: Self-pay | Admitting: *Deleted

## 2012-10-22 MED ORDER — FLUCONAZOLE 150 MG PO TABS: 150.0000 mg | ORAL_TABLET | Freq: Once | ORAL | Status: DC

## 2012-10-22 NOTE — Telephone Encounter (Signed)
Thera called and left a message she was seen about 3 weeks ago by Dr. Jolayne Panther and states she put her on an antibiotic for bacteria infection and wants to know if it cause a yeast infection or if it could be something else.  Also wants results.

## 2012-10-22 NOTE — Telephone Encounter (Signed)
Called pt and pt wanted to know if the antibiotic that she is taken can cause a yeast infection. I informed pt that yes it can.  I informed her that I would e-presrcibe diflucan a one time dose that she help with the yeast infection.  Verifed pharmacy with the pt.  Pt stated understanding.

## 2012-10-25 ENCOUNTER — Telehealth: Payer: Self-pay

## 2012-10-25 NOTE — Telephone Encounter (Signed)
Pt called and stated "that I got a routine pap in which I received an antibiotic for Bacterial infection and something for a yeast infection.  I am still having a discharge with an odor and I am now having spotting for a couple days.  I am wondering if I can have an [place] cause I am having pain I did have cysts." Called pt and I verified with pt what is stated above.  I informed pt that her abnormal cells on her pap nor cysts could be related to her discharge. But sometimes antibiotics can cause some spotting.   I asked pt did she have a hx of cysts she informed me "no".  Pt stated that all this has just started occuring a month and half ago.  I advised pt to take showers instead of soaking in a tube, to try an keep that area dry, no fragrant things down there.  Pt stated understanding and I clarified with her appt for her colpo on 12/07/12.

## 2012-12-07 ENCOUNTER — Encounter: Payer: Self-pay | Admitting: Obstetrics and Gynecology

## 2013-01-10 ENCOUNTER — Encounter: Payer: Self-pay | Admitting: Family Medicine

## 2013-01-28 ENCOUNTER — Ambulatory Visit (INDEPENDENT_AMBULATORY_CARE_PROVIDER_SITE_OTHER): Payer: Self-pay | Admitting: Obstetrics and Gynecology

## 2013-01-28 ENCOUNTER — Other Ambulatory Visit (HOSPITAL_COMMUNITY)
Admission: RE | Admit: 2013-01-28 | Discharge: 2013-01-28 | Disposition: A | Discharge status: Home / Self Care | Payer: Self-pay | Source: Ambulatory Visit | Attending: Obstetrics and Gynecology | Admitting: Obstetrics and Gynecology

## 2013-01-28 ENCOUNTER — Encounter: Payer: Self-pay | Admitting: Obstetrics and Gynecology

## 2013-01-28 VITALS — BP 117/77 | HR 69 | Temp 97.5°F | Ht 61.0 in | Wt 211.3 lb

## 2013-01-28 DIAGNOSIS — IMO0001 Reserved for inherently not codable concepts without codable children: Secondary | ICD-10-CM | POA: Insufficient documentation

## 2013-01-28 DIAGNOSIS — Z01812 Encounter for preprocedural laboratory examination: Secondary | ICD-10-CM

## 2013-01-28 DIAGNOSIS — R87611 Atypical squamous cells cannot exclude high grade squamous intraepithelial lesion on cytologic smear of cervix (ASC-H): Secondary | ICD-10-CM

## 2013-01-28 DIAGNOSIS — N871 Moderate cervical dysplasia: Secondary | ICD-10-CM | POA: Insufficient documentation

## 2013-01-28 NOTE — Progress Notes (Signed)
Patient ID: Jade [name], female   DOB: November 05, 1975, 37 y.o.   MRN: [id number] 37 yo with ASC-H on 09/21/2012 pap smear here for colposcopy  Patient given informed consent, signed copy in the chart, time out was performed.  Placed in lithotomy position. Cervix viewed with speculum and colposcope after application of acetic acid.   Colposcopy adequate?  TZ not fully visualized Acetowhite lesions?yes at 4 o'clock Punctation?no Mosaicism?  no Abnormal vasculature?  no Biopsies?yes 4 o'clock ECC?yes  COMMENTS: Patient was given post procedure instructions.  She will return in 2 weeks for results.

## 2013-02-05 ENCOUNTER — Telehealth: Payer: Self-pay

## 2013-02-05 NOTE — Telephone Encounter (Signed)
Called pt and left message to return call to the clinics.  Re: Pt has an appt scheduled on 02/08/13 @ 1245 and is a 30 min slot.  Pt can keep appt and do colpo.

## 2013-02-05 NOTE — Telephone Encounter (Signed)
Message copied by Faythe Casa on Tue Feb 05, 2013  4:36 PM ------      Message from: Catalina Antigua      Created: Tue Feb 05, 2013  4:14 PM      Regarding: needs LEEP       PLease inform patient of abnormal colposcopy results and need for LEEP             Thanks            Peggy ------

## 2013-02-06 NOTE — Telephone Encounter (Signed)
Patient called and left message stating she is returning a phone call from [place]

## 2013-02-06 NOTE — Telephone Encounter (Addendum)
Pt called the front desk and I informed pt that her colpo results were abnormal and that we would need her to the LEEP.  Pt stated understanding.  Erie Noe will get with her for an appt.

## 2013-02-11 ENCOUNTER — Ambulatory Visit: Payer: Self-pay | Admitting: Obstetrics and Gynecology

## 2013-02-25 ENCOUNTER — Encounter: Payer: Self-pay | Admitting: *Deleted

## 2013-03-18 ENCOUNTER — Encounter: Payer: Self-pay | Admitting: Obstetrics and Gynecology

## 2013-03-18 ENCOUNTER — Ambulatory Visit (INDEPENDENT_AMBULATORY_CARE_PROVIDER_SITE_OTHER): Payer: Self-pay | Admitting: Obstetrics and Gynecology

## 2013-03-18 ENCOUNTER — Other Ambulatory Visit (HOSPITAL_COMMUNITY)
Admission: RE | Admit: 2013-03-18 | Discharge: 2013-03-18 | Disposition: A | Discharge status: Home / Self Care | Payer: Self-pay | Source: Ambulatory Visit | Attending: Obstetrics and Gynecology | Admitting: Obstetrics and Gynecology

## 2013-03-18 VITALS — BP 109/70 | HR 93 | Ht 61.0 in | Wt 208.8 lb

## 2013-03-18 DIAGNOSIS — N879 Dysplasia of cervix uteri, unspecified: Secondary | ICD-10-CM | POA: Insufficient documentation

## 2013-03-18 DIAGNOSIS — R6889 Other general symptoms and signs: Secondary | ICD-10-CM

## 2013-03-18 DIAGNOSIS — IMO0001 Reserved for inherently not codable concepts without codable children: Secondary | ICD-10-CM

## 2013-03-18 LAB — POCT PREGNANCY, URINE: Preg Test, Ur: NEGATIVE

## 2013-03-18 NOTE — Progress Notes (Signed)
Patient ID: Jade [name], female   DOB: 05-Apr-1975, 37 y.o.   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  Pap smear and colposcopy reviewed.   Pap ASC-H  Colpo Biopsy CIN 2-3 ECC negative Teflon coated speculum with smoke evacuator placed.  Cervix visualized. Paracervical block placed.  Medium size LOOP used to remove cone of cervix using blend of cut and cautery on LEEP machine.  Edges/Base cauterized with Ball.  Monsel's solution used for hemostasis.  Patient tolerated procedure well.  Patient given post procedure instructions.  Follow up in 6 months for repeat pap or as needed.

## 2013-03-19 ENCOUNTER — Encounter: Payer: Self-pay | Admitting: *Deleted

## 2013-03-25 ENCOUNTER — Telehealth: Payer: Self-pay | Admitting: *Deleted

## 2013-03-25 NOTE — Telephone Encounter (Signed)
Pt called nurse line requesting advice for heavy bleeding one week after LEEP procedure with Dr. Jolayne Panther.

## 2013-03-26 NOTE — Telephone Encounter (Signed)
Called pt. Stating we are calling to return her call and inform her of results please call clinic.

## 2013-03-26 NOTE — Telephone Encounter (Signed)
Message copied by Louanna Raw on Tue Mar 26, 2013 11:22 AM ------      Message from: CONSTANT, PEGGY      Created: Wed Mar 20, 2013  8:34 AM       Please inform patient that specimen removed during LEEP appears to contain all abnormal cells. Patient needs to keep appointment to follow up in 6 months for repeat pap smear            Thanks            Peggy ------

## 2013-03-27 NOTE — Telephone Encounter (Signed)
Pt. Called front desk. Spoke with patient and informed her of tissue from leep being abnormal. Informed pt. That she will have a repeat pap in 6 months. Pt. States she Will call in May for appointment in June for f/u pap smear. Asked pt. If she was still having bleeding as she had called on the 29th. Pt stated she it had only lasted for a day and is no longer having any issues. Pt. Verbalizes understanding of results and gratitude and has no other questions or concerns.

## 2013-03-28 HISTORY — PX: CERVICAL BIOPSY  W/ LOOP ELECTRODE EXCISION: SUR135

## 2013-09-12 ENCOUNTER — Encounter (HOSPITAL_COMMUNITY): Payer: Self-pay | Admitting: *Deleted

## 2013-09-12 ENCOUNTER — Inpatient Hospital Stay (HOSPITAL_COMMUNITY)
Admission: AD | Admit: 2013-09-12 | Discharge: 2013-09-12 | Disposition: A | Discharge status: Home / Self Care | Payer: Self-pay | Source: Ambulatory Visit | Attending: Obstetrics & Gynecology | Admitting: Obstetrics & Gynecology

## 2013-09-12 DIAGNOSIS — R109 Unspecified abdominal pain: Secondary | ICD-10-CM | POA: Insufficient documentation

## 2013-09-12 DIAGNOSIS — N939 Abnormal uterine and vaginal bleeding, unspecified: Secondary | ICD-10-CM

## 2013-09-12 DIAGNOSIS — N898 Other specified noninflammatory disorders of vagina: Secondary | ICD-10-CM | POA: Insufficient documentation

## 2013-09-12 DIAGNOSIS — N926 Irregular menstruation, unspecified: Secondary | ICD-10-CM

## 2013-09-12 LAB — POCT PREGNANCY, URINE: PREG TEST UR: NEGATIVE

## 2013-09-12 MED ORDER — MEDROXYPROGESTERONE ACETATE 10 MG PO TABS
10.0000 mg | ORAL_TABLET | Freq: Every day | ORAL | Status: DC
Start: 2013-09-12 — End: 2014-06-23

## 2013-09-12 NOTE — Discharge Instructions (Signed)
Abnormal Uterine Bleeding Abnormal uterine bleeding can affect women at various stages in life, including teenagers, women in their reproductive years, pregnant women, and women who have reached menopause. Several kinds of uterine bleeding are considered abnormal, including:  Bleeding or spotting between periods.   Bleeding after sexual intercourse.   Bleeding that is heavier or more than normal.   Periods that last longer than usual.  Bleeding after menopause.  Many cases of abnormal uterine bleeding are minor and simple to treat, while others are more serious. Any type of abnormal bleeding should be evaluated by your health care provider. Treatment will depend on the cause of the bleeding. HOME CARE INSTRUCTIONS Monitor your condition for any changes. The following actions may help to alleviate any discomfort you are experiencing:  Avoid the use of tampons and douches as directed by your health care provider.  Change your pads frequently. You should get regular pelvic exams and Pap tests. Keep all follow-up appointments for diagnostic tests as directed by your health care provider.  SEEK MEDICAL CARE IF:   Your bleeding lasts more than 1 week.   You feel dizzy at times.  SEEK IMMEDIATE MEDICAL CARE IF:   You pass out.   You are changing pads every 15 to 30 minutes.   You have abdominal pain.  You have a fever.   You become sweaty or weak.   You are passing large blood clots from the vagina.   You start to feel nauseous and vomit. MAKE SURE YOU:   Understand these instructions.  Will watch your condition.  Will get help right away if you are not doing well or get worse. Document Released: 03/14/2005 Document Revised: 03/19/2013 Document Reviewed: 10/11/2012 ExitCare Patient Information 2015 [employer]. This information is not intended to replace advice given to you by your health care provider. Make sure you discuss any questions you have with your  health care provider.  

## 2013-09-12 NOTE — MAU Provider Note (Signed)
History     CSN: [id number]  Arrival date and time: 09/12/13 1126   First Provider Initiated Contact with Patient 09/12/13 1234      Chief Complaint  Patient presents with  . Vaginal Bleeding   Vaginal Bleeding Associated symptoms include abdominal pain (cramping). Pertinent negatives include no chills, constipation, diarrhea, fever, nausea or vomiting.   This is a 38 y.o. female who presents with c/o mid-cycle bleeding. Had a period 10 days ago. Had a little cramping but not like usual menstrual cramps. Has never had an abnormal cycle. Denies other symptoms. Declines STD testing. Sees Dr Jolayne Pantheronstant in clinic and is due for repeat Pap (hx LEEP in January)  RN Note:  Pt reports she had a period beginning of the month on 6/9. Started bleeding this morning. Had had some cramping for the past 2 days thought it was from ovulation.        OB History   Grav Para Term Preterm Abortions TAB SAB Ect Mult Living   3 2 2  1 1    2       Past Medical History  Diagnosis Date  . Abnormal Pap smear of cervix     Past Surgical History  Procedure Laterality Date  . Colposcopy    . Cesarean section    . Tubal ligation    . Cervical biopsy  w/ loop electrode excision  03-2013    Family History  Problem Relation Age of Onset  . Hypertension Mother   . Diabetes Mother     History  Substance Use Topics  . Smoking status: Never Smoker   . Smokeless tobacco: Never Used  . Alcohol Use: No    Allergies: No Known Allergies  Prescriptions prior to admission  Medication Sig Dispense Refill  . ibuprofen (ADVIL,MOTRIN) 200 MG tablet Take 400 mg by mouth every 6 (six) hours as needed for moderate pain.        Review of Systems  Constitutional: Negative for fever and chills.  Gastrointestinal: Positive for abdominal pain (cramping). Negative for nausea, vomiting, diarrhea and constipation.  Genitourinary: Positive for vaginal bleeding.       Bleeding  Neurological: Negative for  dizziness.   Physical Exam   Blood pressure 125/77, pulse 64, temperature 98.4 F (36.9 C), temperature source Oral, resp. rate 18, height 5\' 1"  (1.549 m), weight 95.437 kg (210 lb 6.4 oz), last menstrual period 08/30/2013.  Physical Exam  Constitutional: She is oriented to person, place, and time. She appears well-developed and well-nourished. No distress.  HENT:  Head: Normocephalic.  Cardiovascular: Normal rate.   Respiratory: Effort normal.  GI: Soft. She exhibits no distension. There is no tenderness. There is no rebound and no guarding.  Genitourinary: Uterus normal. Vaginal discharge (moderate) found.  Musculoskeletal: Normal range of motion.  Neurological: She is alert and oriented to person, place, and time.  Skin: Skin is warm and dry.  Psychiatric: She has a normal mood and affect.    MAU Course  Procedures  MDM Results for orders placed during the hospital encounter of 09/12/13 (from the past 24 hour(s))  POCT PREGNANCY, URINE     Status: None   Collection Time    09/12/13 12:12 PM      Result Value Ref Range   Preg Test, Ur NEGATIVE  NEGATIVE     Assessment and Plan  A:  Abnormal vaginal bleeding, probably anovulatory  P:  WIll Rx a Provera challenge for 10 days  Told to expect a heavy bleed after last pill        Message sent to clinic to schedule her f/u pap.  Wynelle BourgeoisWILLIAMS,Lahna Nath 09/12/2013, 1:18 PM

## 2013-09-12 NOTE — MAU Note (Signed)
Pt reports she had a period beginning of the month on 6/9. Started bleeding this morning. Had had some cramping for the past 2 days thought it was from ovulation.

## 2013-10-28 ENCOUNTER — Ambulatory Visit (INDEPENDENT_AMBULATORY_CARE_PROVIDER_SITE_OTHER): Payer: Self-pay | Admitting: Obstetrics & Gynecology

## 2014-01-27 ENCOUNTER — Encounter (HOSPITAL_COMMUNITY): Payer: Self-pay | Admitting: *Deleted

## 2014-06-23 ENCOUNTER — Ambulatory Visit (INDEPENDENT_AMBULATORY_CARE_PROVIDER_SITE_OTHER): Payer: 59 | Admitting: Internal Medicine

## 2014-06-23 VITALS — BP 113/76 | HR 70 | Temp 98.1°F | Resp 20 | Ht 62.0 in | Wt 217.0 lb

## 2014-06-23 DIAGNOSIS — Z23 Encounter for immunization: Secondary | ICD-10-CM | POA: Diagnosis not present

## 2014-06-23 DIAGNOSIS — S61210A Laceration without foreign body of right index finger without damage to nail, initial encounter: Secondary | ICD-10-CM | POA: Diagnosis not present

## 2014-06-23 DIAGNOSIS — Z6839 Body mass index (BMI) 39.0-39.9, adult: Secondary | ICD-10-CM

## 2014-06-23 DIAGNOSIS — S61209A Unspecified open wound of unspecified finger without damage to nail, initial encounter: Secondary | ICD-10-CM

## 2014-06-23 NOTE — Progress Notes (Signed)
° °  This chart was scribed for Ellamae Siaobert Doolittle, MD by Luisa DagoPriscilla Tutu, Medical Scribe. This patient was seen in room 1 and the patient's care was started at 7:30 PM.  Subjective:    Patient ID: Jade [name], female    DOB: [birth year], 39 y.o.   MRN: [id number]  HPI Jade [name] is a 39 y.o. female who presents to the office with a cut to her right index finger that occurred 2 days ago. Pt states that she was taking something off her sons toy with a pair of scissors when she came down on her finger with the pair of scissors. Pt is right hand dominant. Tetanus vaccine is not up to date. She  denies fever, neck pain, sore throat, visual disturbance, CP, cough, SOB, abdominal pain, nausea, emesis, diarrhea, urinary symptoms,  weakness, numbness or rash as associated symptoms.      Patient Active Problem List   Diagnosis Date Noted   ASC-cannot exclude HGSIL on Pap 01/28/2013   History of abnormal Pap smear 08/01/2011   Past Medical History  Diagnosis Date   Abnormal Pap smear of cervix    Past Surgical History  Procedure Laterality Date   Colposcopy     Cesarean section     Tubal ligation     Cervical biopsy  w/ loop electrode excision  03-2013   No Known Allergies Prior to Admission medications   Not on File   History   Social History   Marital Status: Single    Spouse Name: N/A   Number of Children: N/A   Years of Education: N/A   Occupational History   Not on file.   Social History Main Topics   Smoking status: Never Smoker    Smokeless tobacco: Never Used   Alcohol Use: No   Drug Use: No   Sexual Activity: Yes    Birth Control/ Protection: Surgical   Other Topics Concern   Not on file   Social History Narrative     Review of Systems  Constitutional: Negative for fever and chills.  HENT: Negative for congestion and sore throat.   Eyes: Negative for visual disturbance.  Respiratory: Negative for cough.   Cardiovascular: Negative for chest  pain.  Gastrointestinal: Negative for abdominal pain and diarrhea.  Genitourinary: Negative for difficulty urinating.  Musculoskeletal: Negative for neck pain.  Skin: Positive for wound. Negative for rash.  Neurological: Negative for weakness and numbness.       Objective:   Physical Exam  Constitutional: She is oriented to person, place, and time. She appears well-developed and well-nourished.  HENT:  Head: Normocephalic and atraumatic.  Cardiovascular: Normal rate.   Pulmonary/Chest: Effort normal.  Abdominal: She exhibits no distension.  Neurological: She is alert and oriented to person, place, and time.  Skin: Skin is warm and dry.  On the ulnar aspect of her 2nd proximal phalanx she has a 1.5 cm laceration that is healing well without signs of infection.  Psychiatric: She has a normal mood and affect.  Nursing note and vitals reviewed.   Assessment & Plan:  Wound, open, finger, initial encounter  BMI 39.0-39.9,adult   Reassured no sutures required  Keep clean and protect at work  tetanus updated  She needs to follow-up with her primary care doctor to discuss her weight problem   I have completed the patient encounter in its entirety as documented by the scribe, with editing by me where necessary. Robert P. Merla Richesoolittle, M.D.

## 2014-11-04 ENCOUNTER — Telehealth: Payer: Self-pay | Admitting: General Practice

## 2014-11-04 NOTE — Telephone Encounter (Signed)
Patient called and left message stating she needs her medical records requested and wants to know how long that takes and what the cost would be. Called patient and told her there isn't a cost but she will need to come by the office and sign a release of information allowing [place] to fax her records to another office. Patient verbalized understanding and had no questions

## 2014-11-17 ENCOUNTER — Ambulatory Visit (INDEPENDENT_AMBULATORY_CARE_PROVIDER_SITE_OTHER): Payer: 59 | Admitting: Family Medicine

## 2014-11-17 ENCOUNTER — Encounter: Payer: Self-pay | Admitting: Family Medicine

## 2014-11-17 VITALS — BP 105/72 | HR 76 | Temp 98.2°F | Resp 16 | Ht 62.5 in | Wt 216.6 lb

## 2014-11-17 DIAGNOSIS — F329 Major depressive disorder, single episode, unspecified: Secondary | ICD-10-CM

## 2014-11-17 DIAGNOSIS — G4700 Insomnia, unspecified: Secondary | ICD-10-CM | POA: Diagnosis not present

## 2014-11-17 DIAGNOSIS — F418 Other specified anxiety disorders: Secondary | ICD-10-CM | POA: Diagnosis not present

## 2014-11-17 DIAGNOSIS — Z01419 Encounter for gynecological examination (general) (routine) without abnormal findings: Secondary | ICD-10-CM

## 2014-11-17 DIAGNOSIS — F419 Anxiety disorder, unspecified: Secondary | ICD-10-CM

## 2014-11-17 DIAGNOSIS — E669 Obesity, unspecified: Secondary | ICD-10-CM

## 2014-11-17 DIAGNOSIS — Z7251 High risk heterosexual behavior: Secondary | ICD-10-CM

## 2014-11-17 DIAGNOSIS — F32A Depression, unspecified: Secondary | ICD-10-CM

## 2014-11-17 DIAGNOSIS — Z13220 Encounter for screening for lipoid disorders: Secondary | ICD-10-CM | POA: Diagnosis not present

## 2014-11-17 DIAGNOSIS — Z0000 Encounter for general adult medical examination without abnormal findings: Secondary | ICD-10-CM | POA: Diagnosis not present

## 2014-11-17 DIAGNOSIS — R0683 Snoring: Secondary | ICD-10-CM | POA: Diagnosis not present

## 2014-11-17 DIAGNOSIS — R5383 Other fatigue: Secondary | ICD-10-CM

## 2014-11-17 DIAGNOSIS — Z131 Encounter for screening for diabetes mellitus: Secondary | ICD-10-CM

## 2014-11-17 LAB — CBC WITH DIFFERENTIAL/PLATELET
BASOS PCT: 0 % (ref 0–1)
Basophils Absolute: 0 10*3/uL (ref 0.0–0.1)
EOS ABS: 0.1 10*3/uL (ref 0.0–0.7)
Eosinophils Relative: 2 % (ref 0–5)
HCT: 40.3 % (ref 36.0–46.0)
Hemoglobin: 13.6 g/dL (ref 12.0–15.0)
Lymphocytes Relative: 42 % (ref 12–46)
Lymphs Abs: 2.4 10*3/uL (ref 0.7–4.0)
MCH: 30 pg (ref 26.0–34.0)
MCHC: 33.7 g/dL (ref 30.0–36.0)
MCV: 89 fL (ref 78.0–100.0)
MONOS PCT: 8 % (ref 3–12)
MPV: 11.4 fL (ref 8.6–12.4)
Monocytes Absolute: 0.5 10*3/uL (ref 0.1–1.0)
NEUTROS PCT: 48 % (ref 43–77)
Neutro Abs: 2.8 10*3/uL (ref 1.7–7.7)
PLATELETS: 264 10*3/uL (ref 150–400)
RBC: 4.53 MIL/uL (ref 3.87–5.11)
RDW: 13.7 % (ref 11.5–15.5)
WBC: 5.8 10*3/uL (ref 4.0–10.5)

## 2014-11-17 LAB — COMPREHENSIVE METABOLIC PANEL
ALT: 17 U/L (ref 6–29)
AST: 17 U/L (ref 10–30)
Albumin: 3.9 g/dL (ref 3.6–5.1)
Alkaline Phosphatase: 60 U/L (ref 33–115)
BUN: 13 mg/dL (ref 7–25)
CHLORIDE: 105 mmol/L (ref 98–110)
CO2: 24 mmol/L (ref 20–31)
CREATININE: 0.8 mg/dL (ref 0.50–1.10)
Calcium: 8.6 mg/dL (ref 8.6–10.2)
Glucose, Bld: 68 mg/dL (ref 65–99)
Potassium: 4 mmol/L (ref 3.5–5.3)
SODIUM: 138 mmol/L (ref 135–146)
TOTAL PROTEIN: 6.7 g/dL (ref 6.1–8.1)
Total Bilirubin: 0.3 mg/dL (ref 0.2–1.2)

## 2014-11-17 LAB — HIV ANTIBODY (ROUTINE TESTING W REFLEX): HIV: NONREACTIVE

## 2014-11-17 LAB — LIPID PANEL
CHOL/HDL RATIO: 3.5 ratio (ref <=5.0)
Cholesterol: 177 mg/dL (ref 125–200)
HDL: 51 mg/dL (ref >=46)
LDL CALC: 109 mg/dL (ref <130)
Triglycerides: 85 mg/dL (ref <150)
VLDL: 17 mg/dL (ref <30)

## 2014-11-17 LAB — POCT URINALYSIS DIPSTICK
Bilirubin, UA: NEGATIVE
Blood, UA: NEGATIVE
Glucose, UA: NEGATIVE
LEUKOCYTES UA: NEGATIVE
NITRITE UA: NEGATIVE
PROTEIN UA: NEGATIVE
SPEC GRAV UA: 1.02
UROBILINOGEN UA: 0.2
pH, UA: 7

## 2014-11-17 LAB — VITAMIN B12: Vitamin B-12: >2000 pg/mL — ABNORMAL HIGH (ref 211–911)

## 2014-11-17 LAB — HEMOGLOBIN A1C
HEMOGLOBIN A1C: 5.8 % — AB (ref <5.7)
Mean Plasma Glucose: 120 mg/dL — ABNORMAL HIGH (ref <117)

## 2014-11-17 LAB — RPR

## 2014-11-17 LAB — TSH: TSH: 0.607 u[IU]/mL (ref 0.350–4.500)

## 2014-11-17 LAB — POCT URINE PREGNANCY: PREG TEST UR: NEGATIVE

## 2014-11-17 NOTE — Patient Instructions (Signed)

## 2014-11-17 NOTE — Progress Notes (Signed)
Subjective:    Patient ID: Jade [name], female    DOB: [birth year], 39 y.o.   MRN: [id number]  HPI This 39 y.o. female presents for a Complete Physical Examination.  Last physical: 2006 Pap smear:  2015; abnormal pap in 2014; regualr repeat; s/p LEEP.  [facility]  Appointment Dr. Tenny Craw for October 2016.  Regular menses.  Nausea, vomiting. Not pregnant. Mammogram: never Colonoscopy:  never TDAP:  2016 Influenza:  2015; going to wait; declines today. Eye exam:  Never; no glasses or contacts. Dental exam:  Overdue.  Looking for new dentist.  Anxiety and depression:  Taking Prozac 20mg  daily; [facility] by [facility] every two months.  Nighttime awakening around 2:00am.    Fatigue: +insomnia.  +recently restarted Prozac for anxiety/depression.  +snores.  No apnea; +frequent naps.   Review of Systems  Constitutional: Positive for fatigue. Negative for fever, chills, diaphoresis, activity change, appetite change and unexpected weight change.  HENT: Negative for congestion, dental problem, drooling, ear discharge, ear pain, facial swelling, hearing loss, mouth sores, nosebleeds, postnasal drip, rhinorrhea, sinus pressure, sneezing, sore throat, tinnitus, trouble swallowing and voice change.   Eyes: Negative.  Negative for photophobia, pain, discharge, redness, itching and visual disturbance.  Respiratory: Negative.  Negative for apnea, cough, choking, chest tightness, shortness of breath, wheezing and stridor.   Cardiovascular: Negative.  Negative for chest pain, palpitations and leg swelling.  Gastrointestinal: Negative.  Negative for nausea, vomiting, abdominal pain, diarrhea, constipation, blood in stool, abdominal distention, anal bleeding and rectal pain.  Endocrine: Positive for heat intolerance and polyphagia. Negative for cold intolerance, polydipsia and polyuria.  Genitourinary: Positive for frequency and menstrual problem. Negative for dysuria, urgency, hematuria, flank pain,  decreased urine volume, vaginal bleeding, vaginal discharge, enuresis, difficulty urinating, genital sores, vaginal pain, pelvic pain and dyspareunia.  Musculoskeletal: Positive for myalgias, back pain and arthralgias. Negative for joint swelling, gait problem, neck pain and neck stiffness.  Skin: Negative for color change, pallor, rash and wound.  Allergic/Immunologic: Negative.  Negative for environmental allergies, food allergies and immunocompromised state.  Neurological: Positive for dizziness, numbness and headaches. Negative for tremors, seizures, syncope, facial asymmetry, speech difficulty, weakness and light-headedness.  Hematological: Negative.  Negative for adenopathy. Does not bruise/bleed easily.  Psychiatric/Behavioral: Positive for dysphoric mood and agitation. Negative for suicidal ideas, hallucinations, behavioral problems, confusion, sleep disturbance, self-injury and decreased concentration. The patient is nervous/anxious. The patient is not hyperactive.    Past Medical History  Diagnosis Date  . Abnormal Pap smear of cervix   . Anxiety   . Depression    Past Surgical History  Procedure Laterality Date  . Colposcopy    . Tubal ligation    . Cervical biopsy  w/ loop electrode excision  03-2013  . Cesarean section      x 1   No Known Allergies Social History   Social History  . Marital Status: Single    Spouse Name: N/A  . Number of Children: N/A  . Years of Education: N/A   Occupational History  . Not on file.   Social History Main Topics  . Smoking status: Never Smoker   . Smokeless tobacco: Never Used  . Alcohol Use: No  . Drug Use: No  . Sexual Activity: Yes    Birth Control/ Protection: Surgical   Other Topics Concern  . Not on file   Social History Narrative   Marital status: single; dating seriously x 7 years.      Children:  2  children (14, 6)      Lives: with two sons      Employment:  Conservation officer, nature at [employer]; first shift for 8 years.       Tobacco: none      Alcohol:  4-6 drinks per month      Drugs: none      Exercise:  Sporadic; twice weekly; walking      Seatbelt: 100%      Guns: none      Sexual activity:  Total partners = 6; history of Chlamydia in 2002; last STD screening 2016; males only   Family History  Problem Relation Age of Onset  . Hypertension Mother   . Diabetes Mother   . Hyperlipidemia Brother   . Pancreatic cancer Maternal Grandmother   . Diabetes Maternal Grandmother        Objective:   Physical Exam  Constitutional: She is oriented to person, place, and time. She appears well-developed and well-nourished. No distress.  HENT:  Head: Normocephalic and atraumatic.  Right Ear: External ear normal.  Left Ear: External ear normal.  Nose: Nose normal.  Mouth/Throat: Oropharynx is clear and moist.  Eyes: Conjunctivae and EOM are normal. Pupils are equal, round, and reactive to light.  Neck: Normal range of motion and full passive range of motion without pain. Neck supple. No JVD present. Carotid bruit is not present. No thyromegaly present.  Cardiovascular: Normal rate, regular rhythm and normal heart sounds.  Exam reveals no gallop and no friction rub.   No murmur heard. Pulmonary/Chest: Effort normal and breath sounds normal. She has no wheezes. She has no rales. Right breast exhibits no inverted nipple, no mass, no nipple discharge, no skin change and no tenderness. Left breast exhibits no inverted nipple, no mass, no nipple discharge, no skin change and no tenderness. Breasts are symmetrical.  Abdominal: Soft. Bowel sounds are normal. She exhibits no distension and no mass. There is no tenderness. There is no rebound and no guarding.  Genitourinary: Vagina normal and uterus normal. There is no rash, tenderness, lesion or injury on the right labia. There is no rash, tenderness, lesion or injury on the left labia. Cervix exhibits no motion tenderness, no discharge and no friability. Right adnexum displays  no mass, no tenderness and no fullness. Left adnexum displays no mass, no tenderness and no fullness. No erythema in the vagina. No vaginal discharge found.  Musculoskeletal:       Right shoulder: Normal.       Left shoulder: Normal.       Cervical back: Normal.  Lymphadenopathy:    She has no cervical adenopathy.  Neurological: She is alert and oriented to person, place, and time. She has normal reflexes. No cranial nerve deficit. She exhibits normal muscle tone. Coordination normal.  Skin: Skin is warm and dry. No rash noted. She is not diaphoretic. No erythema. No pallor.  Psychiatric: She has a normal mood and affect. Her behavior is normal. Judgment and thought content normal.  Nursing note and vitals reviewed.       Assessment & Plan:  Routine physical examination  Other fatigue - Plan: POCT urinalysis dipstick, POCT urine pregnancy, CBC with Differential/Platelet, Comprehensive metabolic panel, TSH, Vitamin B12, Vit D  25 hydroxy (rtn osteoporosis monitoring)  Anxiety and depression - Plan: TSH, Vitamin B12, Vit D  25 hydroxy (rtn osteoporosis monitoring)  Insomnia  Snoring  Obesity - Plan: POCT urinalysis dipstick, POCT urine pregnancy, CBC with Differential/Platelet, Comprehensive metabolic panel, Hemoglobin A1c, Lipid  panel, TSH  Screening for diabetes mellitus - Plan: POCT urinalysis dipstick, Comprehensive metabolic panel, Hemoglobin A1c  Screening, lipid - Plan: Lipid panel  Encounter for routine gynecological examination - Plan: Pap IG, CT/NG NAA, and HPV (high risk)  High risk sexual behavior - Plan: Pap IG, CT/NG NAA, and HPV (high risk), RPR, HIV antibody   1. Complete Physical Examination: anticipatory guidance --- exercise, weight loss.  Immunizations UTD.  Pap smear obtained. 2. Gynecological exam: pap smear obtained; s/p BTL.  Suffering with menorrhagia with n/v/d around menses. 3.  Fatigue: New. Obtain labs.  Recently restarted antidepressant thus fatigue  may improve with improved mood and sleep.  If persists, schedule sleep study. 4.  Obesity: obtain labs.  Highly recommend weight loss, exercise, low-caloric food choices. 5. Screening diabetes: obtain glucose, HgbA1c. 6.  Screening lipid: obtain FLP. 7. High risk sexual behavior: obtain GC/Chlam, RPR, HIV.  8. Anxiety and depression with insomnia: recently restarted Prozac therapy; followed by [facility].   9. Snoring: consider sleep study.     Meds ordered this encounter  Medications  . FLUoxetine (PROZAC) 20 MG capsule    Sig: Take 20 mg by mouth daily.    Finesse Fielder Paulita Fujita, M.D. [facility] [address] [place], [place]  [address] [phone] phone [phone] fax

## 2014-11-18 LAB — VITAMIN D 25 HYDROXY (VIT D DEFICIENCY, FRACTURES): Vit D, 25-Hydroxy: 19 ng/mL — ABNORMAL LOW (ref 30–100)

## 2014-11-19 LAB — PAP IG, CT-NG NAA, HPV HIGH-RISK
CHLAMYDIA PROBE AMP: NEGATIVE
GC PROBE AMP: NEGATIVE
HPV DNA High Risk: NOT DETECTED

## 2015-01-06 ENCOUNTER — Telehealth: Payer: Self-pay

## 2015-01-06 NOTE — Telephone Encounter (Signed)
Pt put in a medical records request last week and she would like to know the status of this.  Said she came into the office and filled out the appropriate form. [phone]

## 2015-01-06 NOTE — Telephone Encounter (Signed)
Spoke with patient. She needs copy of last CPE for pickup. She will to 102 for pickup.

## 2016-02-29 ENCOUNTER — Encounter (HOSPITAL_COMMUNITY): Payer: Self-pay | Admitting: *Deleted

## 2016-02-29 ENCOUNTER — Inpatient Hospital Stay (HOSPITAL_COMMUNITY)
Admission: AD | Admit: 2016-02-29 | Discharge: 2016-02-29 | Disposition: A | Discharge status: Home / Self Care | Payer: Medicaid Other | Source: Ambulatory Visit | Attending: Family Medicine | Admitting: Family Medicine

## 2016-02-29 DIAGNOSIS — K529 Noninfective gastroenteritis and colitis, unspecified: Secondary | ICD-10-CM | POA: Diagnosis not present

## 2016-02-29 DIAGNOSIS — A599 Trichomoniasis, unspecified: Secondary | ICD-10-CM

## 2016-02-29 DIAGNOSIS — Z3202 Encounter for pregnancy test, result negative: Secondary | ICD-10-CM | POA: Diagnosis not present

## 2016-02-29 DIAGNOSIS — K6289 Other specified diseases of anus and rectum: Secondary | ICD-10-CM | POA: Insufficient documentation

## 2016-02-29 DIAGNOSIS — A5901 Trichomonal vulvovaginitis: Secondary | ICD-10-CM | POA: Insufficient documentation

## 2016-02-29 DIAGNOSIS — R198 Other specified symptoms and signs involving the digestive system and abdomen: Secondary | ICD-10-CM

## 2016-02-29 LAB — URINALYSIS, ROUTINE W REFLEX MICROSCOPIC
Bilirubin Urine: NEGATIVE
Glucose, UA: NEGATIVE mg/dL
Ketones, ur: NEGATIVE mg/dL
LEUKOCYTES UA: NEGATIVE
NITRITE: NEGATIVE
PH: 5.5 (ref 5.0–8.0)
Protein, ur: NEGATIVE mg/dL

## 2016-02-29 LAB — URINE MICROSCOPIC-ADD ON: WBC UA: NONE SEEN WBC/hpf (ref 0–5)

## 2016-02-29 LAB — WET PREP, GENITAL
SPERM: NONE SEEN
Yeast Wet Prep HPF POC: NONE SEEN

## 2016-02-29 LAB — POCT PREGNANCY, URINE: PREG TEST UR: NEGATIVE

## 2016-02-29 MED ORDER — KETOROLAC TROMETHAMINE 60 MG/2ML IM SOLN
60.0000 mg | Freq: Once | INTRAMUSCULAR | Status: AC
  Administered 2016-02-29: 60 mg via INTRAMUSCULAR
  Filled 2016-02-29: qty 2

## 2016-02-29 MED ORDER — METRONIDAZOLE 500 MG PO TABS
2000.0000 mg | ORAL_TABLET | Freq: Once | ORAL | Status: AC
  Administered 2016-02-29: 2000 mg via ORAL
  Filled 2016-02-29: qty 4

## 2016-02-29 NOTE — MAU Provider Note (Signed)
History     CSN: [id number]  Arrival date and time: 02/29/16 [id number]   First Provider Initiated Contact with Patient 02/29/16 [phone]      Chief Complaint  Patient presents with  . Rectal Pain  . Nausea   HPI   Ms.Jade [name] is a 40 y.o. female 253P2012 non-pregnant female here in MAU with rectal pressure, nausea and brown/bloody discharge. The vaginal discharge started 1 week ago. She feels she has a GI bug based on her nausea and occasional diarrhea today. Yesterday she had 6 episodes of diarrhea. She feels the symptoms have slowed down. She denies fever or sick contacts. The rectal pressure has been present for over 3 weeks. She has not seen any other provider for this complaint.   OB History    Gravida Para Term Preterm AB Living   3 2 2  0 1 2   SAB TAB Ectopic Multiple Live Births   0 1 0 0 2      Past Medical History:  Diagnosis Date  . Abnormal Pap smear of cervix   . Anxiety   . Depression     Past Surgical History:  Procedure Laterality Date  . CERVICAL BIOPSY  W/ LOOP ELECTRODE EXCISION  03-2013  . CESAREAN SECTION     x 1  . COLPOSCOPY    . TUBAL LIGATION      Family History  Problem Relation Age of Onset  . Hypertension Mother   . Diabetes Mother   . Hyperlipidemia Brother   . Pancreatic cancer Maternal Grandmother   . Diabetes Maternal Grandmother     Social History  Substance Use Topics  . Smoking status: Never Smoker  . Smokeless tobacco: Never Used  . Alcohol use No    Allergies: No Known Allergies  Prescriptions Prior to Admission  Medication Sig Dispense Refill Last Dose  . FLUoxetine (PROZAC) 20 MG capsule Take 20 mg by mouth daily.   02/29/2016 at Unknown time   Results for orders placed or performed during the hospital encounter of 02/29/16 (from the past 72 hour(s))  GC/Chlamydia probe amp ([place])[facility]     Status: None   Collection Time: 02/29/16 12:00 AM  Result Value Ref Range   Chlamydia Negative     Comment:  Normal Reference Range - Negative   Neisseria gonorrhea Negative     Comment: Normal Reference Range - Negative  Urinalysis, Routine w reflex microscopic (not at [facility])     Status: Abnormal   Collection Time: 02/29/16  8:29 AM  Result Value Ref Range   Color, Urine YELLOW YELLOW   APPearance CLEAR CLEAR   Specific Gravity, Urine >1.030 (H) 1.005 - 1.030   pH 5.5 5.0 - 8.0   Glucose, UA NEGATIVE NEGATIVE mg/dL   Hgb urine dipstick SMALL (A) NEGATIVE   Bilirubin Urine NEGATIVE NEGATIVE   Ketones, ur NEGATIVE NEGATIVE mg/dL   Protein, ur NEGATIVE NEGATIVE mg/dL   Nitrite NEGATIVE NEGATIVE   Leukocytes, UA NEGATIVE NEGATIVE  Urine microscopic-add on     Status: Abnormal   Collection Time: 02/29/16  8:29 AM  Result Value Ref Range   Squamous Epithelial / LPF 0-5 (A) NONE SEEN   WBC, UA NONE SEEN 0 - 5 WBC/hpf   RBC / HPF 0-5 0 - 5 RBC/hpf   Bacteria, UA FEW (A) NONE SEEN   Urine-Other MUCOUS PRESENT   Pregnancy, urine POC     Status: None   Collection Time: 02/29/16  8:39 AM  Result Value Ref Range   Preg Test, Ur NEGATIVE NEGATIVE    Comment:        THE SENSITIVITY OF THIS METHODOLOGY IS >24 mIU/mL   Wet prep, genital     Status: Abnormal   Collection Time: 02/29/16 10:08 AM  Result Value Ref Range   Yeast Wet Prep HPF POC NONE SEEN NONE SEEN   Trich, Wet Prep PRESENT (A) NONE SEEN   Clue Cells Wet Prep HPF POC PRESENT (A) NONE SEEN   WBC, Wet Prep HPF POC MODERATE (A) NONE SEEN    Comment: MODERATE BACTERIA SEEN   Sperm NONE SEEN     Review of Systems  Constitutional: Negative for chills and fever.  Gastrointestinal: Positive for abdominal pain, blood in stool (None currently ), diarrhea and nausea.   Physical Exam   Blood pressure 126/85, pulse 73, temperature 99 F (37.2 C), temperature source Oral, resp. rate 18, weight 229 lb 12 oz (104.2 kg), last menstrual period 01/27/2016, SpO2 96 %.  Physical Exam  Constitutional: She is oriented to person, place, and time.  She appears well-developed and well-nourished. No distress.  HENT:  Head: Normocephalic.  Genitourinary: Rectum normal. Rectal exam shows no external hemorrhoid, no fissure, no mass, no tenderness and anal tone normal. Cervix exhibits no motion tenderness.  Genitourinary Comments: Vagina - Small amount of white vaginal discharge, no odor  Cervix - No contact bleeding, no active bleeding  Bimanual exam: Cervix closed Uterus non tender, normal size Adnexa non tender, no masses bilaterally GC/Chlam, wet prep done Chaperone present for exam.   Musculoskeletal: Normal range of motion.  Neurological: She is alert and oriented to person, place, and time.  Skin: Skin is warm. She is not diaphoretic.  Psychiatric: Her behavior is normal.    MAU Course  Procedures  None  MDM  UA Wet prep & GCC Flagyl 2 gram X 1 dose. Patient opted to take one time dose as apposed to the 7 day treatment.   Assessment and Plan   A:  1. Trichomonas infection   2. Rectal pressure   3. Gastroenteritis, acute     P:  Discharge home in stable condition Follow up with PCP- patient has an appointment this week Return to MAU for emergencies only Partner needs treatment- then no intercourse X 7 days.   Duane LopeJennifer I Rasch, NP 03/02/2016 10:25 AM

## 2016-02-29 NOTE — MAU Note (Signed)
All started about 3 wks ago, cramping, rectal pain. Last wk had some brownish d/c and some rectal bleeding.  Threw up yesterday noted blood in it.  Wanting to make sure she isn't preg, hx of tubal ligation and currently has Mirena to help control bleeding.

## 2016-02-29 NOTE — Discharge Instructions (Signed)
Food Choices to Help Relieve Diarrhea, Adult When you have diarrhea, the foods you eat and your eating habits are very important. Choosing the right foods and drinks can help relieve diarrhea. Also, because diarrhea can last up to 7 days, you need to replace lost fluids and electrolytes (such as sodium, potassium, and chloride) in order to help prevent dehydration. What general guidelines do I need to follow?  Slowly drink 1 cup (8 oz) of fluid for each episode of diarrhea. If you are getting enough fluid, your urine will be clear or pale yellow.  Eat starchy foods. Some good choices include white rice, white toast, pasta, low-fiber cereal, baked potatoes (without the skin), saltine crackers, and bagels.  Avoid large servings of any cooked vegetables.  Limit fruit to two servings per day. A serving is  cup or 1 small piece.  Choose foods with less than 2 g of fiber per serving.  Limit fats to less than 8 tsp (38 g) per day.  Avoid fried foods.  Eat foods that have probiotics in them. Probiotics can be found in certain dairy products.  Avoid foods and beverages that may increase the speed at which food moves through the stomach and intestines (gastrointestinal tract). Things to avoid include:  High-fiber foods, such as dried fruit, raw fruits and vegetables, nuts, seeds, and whole grain foods.  Spicy foods and high-fat foods.  Foods and beverages sweetened with high-fructose corn syrup, honey, or sugar alcohols such as xylitol, sorbitol, and mannitol. What foods are recommended? Grains  White rice. White, [place], or pita breads (fresh or toasted), including plain rolls, buns, or bagels. White pasta. Saltine, soda, or graham crackers. Pretzels. Low-fiber cereal. Cooked cereals made with water (such as cornmeal, farina, or cream cereals). Plain muffins. Matzo. Melba toast. Zwieback. Vegetables  Potatoes (without the skin). Strained tomato and vegetable juices. Most well-cooked and canned  vegetables without seeds. Tender lettuce. Fruits  Cooked or canned applesauce, apricots, cherries, fruit cocktail, grapefruit, peaches, pears, or plums. Fresh bananas, apples without skin, cherries, grapes, cantaloupe, grapefruit, peaches, oranges, or plums. Meat and Other Protein Products  Baked or boiled chicken. Eggs. Tofu. Fish. Seafood. Smooth peanut butter. Ground or well-cooked tender beef, ham, veal, lamb, pork, or poultry. Dairy  Plain yogurt, kefir, and unsweetened liquid yogurt. Lactose-free milk, buttermilk, or soy milk. Plain hard cheese. Beverages  Sport drinks. Clear broths. Diluted fruit juices (except prune). Regular, caffeine-free sodas such as ginger ale. Water. Decaffeinated teas. Oral rehydration solutions. Sugar-free beverages not sweetened with sugar alcohols. Other  Bouillon, broth, or soups made from recommended foods. The items listed above may not be a complete list of recommended foods or beverages. Contact your dietitian for more options.  What foods are not recommended? Grains  Whole grain, whole wheat, bran, or rye breads, rolls, pastas, crackers, and cereals. Wild or brown rice. Cereals that contain more than 2 g of fiber per serving. Corn tortillas or taco shells. Cooked or dry oatmeal. Granola. Popcorn. Vegetables  Raw vegetables. Cabbage, broccoli, Brussels sprouts, artichokes, baked beans, beet greens, corn, kale, legumes, peas, sweet potatoes, and yams. Potato skins. Cooked spinach and cabbage. Fruits  Dried fruit, including raisins and dates. Raw fruits. Stewed or dried prunes. Fresh apples with skin, apricots, mangoes, pears, raspberries, and strawberries. Meat and Other Protein Products  Chunky peanut butter. Nuts and seeds. Beans and lentils. Berniece Salines. Dairy  High-fat cheeses. Milk, chocolate milk, and beverages made with milk, such as milk shakes. Cream. Ice cream. Sweets and Desserts  Sweet rolls, doughnuts, and sweet breads. Pancakes and waffles. Fats  and Oils  Butter. Cream sauces. Margarine. Salad oils. Plain salad dressings. Olives. Avocados. Beverages  Caffeinated beverages (such as coffee, tea, soda, or energy drinks). Alcoholic beverages. Fruit juices with pulp. Prune juice. Soft drinks sweetened with high-fructose corn syrup or sugar alcohols. Other  Coconut. Hot sauce. Chili powder. Mayonnaise. Gravy. Cream-based or milk-based soups. The items listed above may not be a complete list of foods and beverages to avoid. Contact your dietitian for more information.  What should I do if I become dehydrated? Diarrhea can sometimes lead to dehydration. Signs of dehydration include dark urine and dry mouth and skin. If you think you are dehydrated, you should rehydrate with an oral rehydration solution. These solutions can be purchased at pharmacies, retail stores, or online. Drink -1 cup (120-240 mL) of oral rehydration solution each time you have an episode of diarrhea. If drinking this amount makes your diarrhea worse, try drinking smaller amounts more often. For example, drink 1-3 tsp (5-15 mL) every 5-10 minutes. A general rule for staying hydrated is to drink 1-2 L of fluid per day. Talk to your health care provider about the specific amount you should be drinking each day. Drink enough fluids to keep your urine clear or pale yellow. This information is not intended to replace advice given to you by your health care provider. Make sure you discuss any questions you have with your health care provider. Document Released: 06/04/2003 Document Revised: 08/20/2015 Document Reviewed: 02/04/2013 Elsevier Interactive Patient Education  2017 Elsevier Inc. Trichomoniasis Trichomoniasis is an infection caused by an organism called Trichomonas. The infection can affect both women and men. In women, the outer female genitalia and the vagina are affected. In men, the penis is mainly affected, but the prostate and other reproductive organs can also be  involved. Trichomoniasis is a sexually transmitted infection (STI) and is most often passed to another person through sexual contact.  RISK FACTORS  Having unprotected sexual intercourse.  Having sexual intercourse with an infected partner. SIGNS AND SYMPTOMS  Symptoms of trichomoniasis in women include:  Abnormal gray-green frothy vaginal discharge.  Itching and irritation of the vagina.  Itching and irritation of the area outside the vagina. Symptoms of trichomoniasis in men include:   Penile discharge with or without pain.  Pain during urination. This results from inflammation of the urethra. DIAGNOSIS  Trichomoniasis may be found during a Pap test or physical exam. Your health care provider may use one of the following methods to help diagnose this infection:  Testing the pH of the vagina with a test tape.  Using a vaginal swab test that checks for the Trichomonas organism. A test is available that provides results within a few minutes.  Examining a urine sample.  Testing vaginal secretions. Your health care provider may test you for other STIs, including HIV. TREATMENT   You may be given medicine to fight the infection. Women should inform their health care provider if they could be or are pregnant. Some medicines used to treat the infection should not be taken during pregnancy.  Your health care provider may recommend over-the-counter medicines or creams to decrease itching or irritation.  Your sexual partner will need to be treated if infected.  Your health care provider may test you for infection again 3 months after treatment. HOME CARE INSTRUCTIONS   Take medicines only as directed by your health care provider.  Take over-the-counter medicine for itching or irritation as  directed by your health care provider.  Do not have sexual intercourse while you have the infection.  Women should not douche or wear tampons while they have the infection.  Discuss your  infection with your partner. Your partner may have gotten the infection from you, or you may have gotten it from your partner.  Have your sex partner get examined and treated if necessary.  Practice safe, informed, and protected sex.  See your health care provider for other STI testing. SEEK MEDICAL CARE IF:   You still have symptoms after you finish your medicine.  You develop abdominal pain.  You have pain when you urinate.  You have bleeding after sexual intercourse.  You develop a rash.  Your medicine makes you sick or makes you throw up (vomit). MAKE SURE YOU:  Understand these instructions.  Will watch your condition.  Will get help right away if you are not doing well or get worse. This information is not intended to replace advice given to you by your health care provider. Make sure you discuss any questions you have with your health care provider. Document Released: 09/07/2000 Document Revised: 04/04/2014 Document Reviewed: 12/24/2012 Elsevier Interactive Patient Education  2017 [employer]

## 2016-03-01 LAB — GC/CHLAMYDIA PROBE AMP (~~LOC~~) NOT AT ARMC
CHLAMYDIA, DNA PROBE: NEGATIVE
NEISSERIA GONORRHEA: NEGATIVE

## 2016-03-15 ENCOUNTER — Ambulatory Visit (INDEPENDENT_AMBULATORY_CARE_PROVIDER_SITE_OTHER): Payer: Medicaid Other | Admitting: Family Medicine

## 2016-03-15 ENCOUNTER — Other Ambulatory Visit: Payer: Self-pay | Admitting: Family Medicine

## 2016-03-15 ENCOUNTER — Encounter: Payer: Self-pay | Admitting: Family Medicine

## 2016-03-15 VITALS — BP 112/64 | HR 80 | Temp 98.2°F | Resp 16 | Ht 61.0 in | Wt 232.0 lb

## 2016-03-15 DIAGNOSIS — E8881 Metabolic syndrome: Secondary | ICD-10-CM

## 2016-03-15 DIAGNOSIS — Z23 Encounter for immunization: Secondary | ICD-10-CM | POA: Diagnosis not present

## 2016-03-15 DIAGNOSIS — R635 Abnormal weight gain: Secondary | ICD-10-CM

## 2016-03-15 DIAGNOSIS — N760 Acute vaginitis: Secondary | ICD-10-CM | POA: Diagnosis not present

## 2016-03-15 DIAGNOSIS — L298 Other pruritus: Secondary | ICD-10-CM

## 2016-03-15 DIAGNOSIS — B9689 Other specified bacterial agents as the cause of diseases classified elsewhere: Secondary | ICD-10-CM

## 2016-03-15 DIAGNOSIS — N898 Other specified noninflammatory disorders of vagina: Secondary | ICD-10-CM

## 2016-03-15 DIAGNOSIS — Z6839 Body mass index (BMI) 39.0-39.9, adult: Secondary | ICD-10-CM | POA: Diagnosis not present

## 2016-03-15 LAB — POCT GLYCOSYLATED HEMOGLOBIN (HGB A1C): HEMOGLOBIN A1C: 5.6

## 2016-03-15 LAB — POCT URINALYSIS DIP (DEVICE)
BILIRUBIN URINE: NEGATIVE
GLUCOSE, UA: NEGATIVE mg/dL
LEUKOCYTES UA: NEGATIVE
Nitrite: NEGATIVE
Protein, ur: NEGATIVE mg/dL
SPECIFIC GRAVITY, URINE: 1.025 (ref 1.005–1.030)
Urobilinogen, UA: 4 mg/dL — ABNORMAL HIGH (ref 0.0–1.0)
pH: 5.5 (ref 5.0–8.0)

## 2016-03-15 LAB — CBC WITH DIFFERENTIAL/PLATELET
BASOS PCT: 0 %
Basophils Absolute: 0 cells/uL (ref 0–200)
EOS ABS: 66 {cells}/uL (ref 15–500)
Eosinophils Relative: 1 %
HEMATOCRIT: 39.6 % (ref 35.0–45.0)
HEMOGLOBIN: 12.9 g/dL (ref 11.7–15.5)
LYMPHS ABS: 2244 {cells}/uL (ref 850–3900)
LYMPHS PCT: 34 %
MCH: 29.5 pg (ref 27.0–33.0)
MCHC: 32.6 g/dL (ref 32.0–36.0)
MCV: 90.4 fL (ref 80.0–100.0)
MONO ABS: 528 {cells}/uL (ref 200–950)
MPV: 11.8 fL (ref 7.5–12.5)
Monocytes Relative: 8 %
NEUTROS PCT: 57 %
Neutro Abs: 3762 cells/uL (ref 1500–7800)
Platelets: 264 10*3/uL (ref 140–400)
RBC: 4.38 MIL/uL (ref 3.80–5.10)
RDW: 13.9 % (ref 11.0–15.0)
WBC: 6.6 10*3/uL (ref 3.8–10.8)

## 2016-03-15 LAB — POCT WET PREP (WET MOUNT): Trichomonas Wet Prep HPF POC: ABSENT

## 2016-03-15 LAB — TSH: TSH: 0.34 mIU/L — ABNORMAL LOW

## 2016-03-15 MED ORDER — METRONIDAZOLE 500 MG PO TABS: 500.0000 mg | ORAL_TABLET | Freq: Two times a day (BID) | ORAL | 0 refills | Status: AC

## 2016-03-15 NOTE — Progress Notes (Signed)
Subjective:    Patient ID: Jade [name], female    DOB: Sep 07, 1975, 40 y.o.   MRN: [id number]  HPI Jade [name], a 40 year old female presents to establish care. She says that she was previously a patient at [facility], but has been lost to follow up. Patient complains of fatigue and a 13 pound weight gain over the past 2 months.  Sentinal symptom the patient feels fatigue began with: significant change in weight and exercise intolerance. Symptoms of her fatigue have been lack of interest in usual activities. Patient describes the following psychologic symptoms: stress in the family.  Patient denies unusual rashes, cold intolerance, constipation and change in hair texture., GI blood loss and excessive menstrual bleeding. Symptoms have been intermittent. Severity has been symptoms bothersome, but easily able to carry out all usual work/school/family activities.   Patient complains of an abnormal vaginal discharge for a few days. Vaginal symptoms include discharge described as white and local irritation.Vulvar symptoms include vulvar itching. Past Medical History:  Diagnosis Date  . Abnormal Pap smear of cervix   . Anxiety   . Depression    Social History   Social History  . Marital status: Single    Spouse name: N/A  . Number of children: N/A  . Years of education: N/A   Occupational History  . Not on file.   Social History Main Topics  . Smoking status: Never Smoker  . Smokeless tobacco: Never Used  . Alcohol use Yes     Comment: socially   . Drug use: No  . Sexual activity: Yes    Birth control/ protection: Surgical, IUD   Other Topics Concern  . Not on file   Social History Narrative   Marital status: single; dating seriously x 7 years.      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      Tobacco: none      Alcohol:  4-6 drinks per month      Drugs: none      Exercise:  Sporadic;  twice weekly; walking      Seatbelt: 100%      Guns: none      Sexual activity:  Total partners = 6; history of Chlamydia in 2002; last STD screening 2016; males only  No Known Allergies  Immunization History  Administered Date(s) Administered  . Influenza,inj,Quad PF,36+ Mos 03/15/2016  . Tdap 06/23/2014    Review of Systems  Constitutional: Positive for fatigue.  HENT: Negative.   Eyes: Negative for visual disturbance.  Respiratory: Negative.   Cardiovascular: Negative.   Gastrointestinal: Negative.  Negative for nausea, rectal pain and vomiting.  Endocrine: Positive for cold intolerance, polydipsia, polyphagia and polyuria.  Genitourinary: Negative.   Musculoskeletal: Negative.   Skin: Negative.   Allergic/Immunologic: Negative for immunocompromised state.  Neurological: Negative.  Negative for tremors and weakness.  Hematological: Negative.   Psychiatric/Behavioral: Negative.  Negative for sleep disturbance and suicidal ideas.       Objective:   Physical Exam  Constitutional: She is oriented to person, place, and time. She appears well-developed and well-nourished.  HENT:  Head: Normocephalic and atraumatic.  Right Ear: External ear normal.  Left Ear: External ear normal.  Nose: Nose normal.  Mouth/Throat: Oropharynx is clear and moist.  Eyes: Conjunctivae are normal. Pupils are equal, round, and reactive to light.  Neck: Normal range of motion. Neck supple.  Cardiovascular: Normal rate, normal heart sounds and intact distal pulses.   Pulmonary/Chest: Effort normal and breath sounds normal.  Abdominal: Soft. Bowel sounds are normal.  Musculoskeletal: Normal range of motion.  Neurological: She is alert and oriented to person, place, and time. She has normal reflexes.  Skin: Skin is warm and dry.  Psychiatric: She has a normal mood and affect. Her behavior is normal. Judgment and thought content normal.       BP 112/64 (BP Location: Left Arm, Patient Position:  Sitting, Cuff Size: Large)   Pulse 80   Temp 98.2 F (36.8 C) (Oral)   Resp 16   Ht 5\' 1"  (1.549 m)   Wt 232 lb (105.2 kg)   LMP 01/27/2016 (Approximate) Comment: spotting   SpO2 98%   BMI 43.84 kg/m  Assessment & Plan:  1. Bacterial vaginitis 3-5 clue  - metroNIDAZOLE (FLAGYL) 500 MG tablet; Take 1 tablet (500 mg total) by mouth 2 (two) times daily.  Dispense: 14 tablet; Refill: 0 - POCT Wet Prep [facility]([place])  2. BMI 39.0-39.9,adult Recommend a lowfat, low carbohydrate diet divided over 5-6 small meals, increase water intake to 6-8 glasses, and 150 minutes per week of cardiovascular exercise.    3. Excessive body weight gain - HgB A1c - TSH  4. Vaginal itching - POCT Wet Prep [facility]([place] [place])  5. Vaginal discharge - POCT Wet Prep [facility]([place])  6. Metabolic syndrome The patient is asked to make an attempt to improve diet and exercise patterns to aid in medical management of this problem. - TSH - Lipid Panel - COMPLETE METABOLIC PANEL WITH GFR - CBC with Differential  7. Need for immunization against influenza - Flu Vaccine QUAD 36+ mos [name] (Fluarix)         Preventative care:  Send referral to Women's clinic-Patient has had Mirena for 1 year.  Recommend monthly self breast exam   RTC: Will schedule follow up after reviewing laboratory results    The patient was given clear instructions to go to ER or return to medical center if symptoms do not improve, worsen or new problems develop. The patient verbalized understanding. Will notify patient with laboratory results. Massie MaroonHollis,Shallon Yaklin M, FNP

## 2016-03-16 LAB — COMPLETE METABOLIC PANEL WITH GFR
ALT: 17 U/L (ref 6–29)
AST: 17 U/L (ref 10–30)
Albumin: 3.8 g/dL (ref 3.6–5.1)
Alkaline Phosphatase: 52 U/L (ref 33–115)
BUN: 14 mg/dL (ref 7–25)
CHLORIDE: 106 mmol/L (ref 98–110)
CO2: 23 mmol/L (ref 20–31)
CREATININE: 0.79 mg/dL (ref 0.50–1.10)
Calcium: 8.7 mg/dL (ref 8.6–10.2)
GFR, Est African American: >89 mL/min (ref >=60)
GFR, Est Non African American: >89 mL/min (ref >=60)
Glucose, Bld: 89 mg/dL (ref 65–99)
Potassium: 4 mmol/L (ref 3.5–5.3)
SODIUM: 140 mmol/L (ref 135–146)
Total Bilirubin: 0.3 mg/dL (ref 0.2–1.2)
Total Protein: 6.3 g/dL (ref 6.1–8.1)

## 2016-03-16 LAB — LIPID PANEL
CHOLESTEROL: 161 mg/dL (ref <200)
HDL: 36 mg/dL — ABNORMAL LOW (ref >50)
LDL CALC: 99 mg/dL (ref <100)
Total CHOL/HDL Ratio: 4.5 Ratio (ref <5.0)
Triglycerides: 131 mg/dL (ref <150)
VLDL: 26 mg/dL (ref <30)

## 2016-03-17 LAB — T3, FREE: T3 FREE: 3.3 pg/mL (ref 2.3–4.2)

## 2016-03-17 LAB — T4, FREE: FREE T4: 1.1 ng/dL (ref 0.8–1.8)

## 2016-06-03 ENCOUNTER — Ambulatory Visit: Payer: Medicaid Other | Admitting: Family Medicine

## 2016-06-14 ENCOUNTER — Ambulatory Visit: Payer: Medicaid Other | Admitting: Family Medicine

## 2016-10-07 ENCOUNTER — Encounter: Payer: Self-pay | Admitting: Family Medicine

## 2016-10-07 ENCOUNTER — Ambulatory Visit (INDEPENDENT_AMBULATORY_CARE_PROVIDER_SITE_OTHER): Payer: Medicaid Other | Admitting: Family Medicine

## 2016-10-07 DIAGNOSIS — F329 Major depressive disorder, single episode, unspecified: Secondary | ICD-10-CM

## 2016-10-07 DIAGNOSIS — F32A Depression, unspecified: Secondary | ICD-10-CM

## 2016-10-07 LAB — POCT URINALYSIS DIP (DEVICE)
BILIRUBIN URINE: NEGATIVE
GLUCOSE, UA: NEGATIVE mg/dL
KETONES UR: NEGATIVE mg/dL
Leukocytes, UA: NEGATIVE
NITRITE: NEGATIVE
PH: 6.5 (ref 5.0–8.0)
PROTEIN: NEGATIVE mg/dL
Specific Gravity, Urine: 1.02 (ref 1.005–1.030)
Urobilinogen, UA: 4 mg/dL — ABNORMAL HIGH (ref 0.0–1.0)

## 2016-10-07 LAB — POCT GLYCOSYLATED HEMOGLOBIN (HGB A1C): HEMOGLOBIN A1C: 5.6

## 2016-10-07 MED ORDER — BUPROPION HCL ER (XL) 150 MG PO TB24: 150.0000 mg | ORAL_TABLET | Freq: Every day | ORAL | 5 refills | Status: DC

## 2016-10-07 NOTE — Progress Notes (Signed)
Subjective:    Patient ID: Jade [name], female    DOB: 1976/01/30, 41 y.o.   MRN: [id number]  HPI Jade [name], a 41 year old year old female with a history of morbid obesity and depression presents for follow up.  She has been following a low calorie diet. She says that she has not been exercising over the past several months due to time constraints. She says that she has been gaining weight consistently. She denies fatigue, intolerance to heat or cold, polyuria, polydipsia, or polyphagia.   Patient is also complaining of depression.She has been followed by [facility] over the past several years.  She complains of anhedonia, depressed mood and weight gain.   She denies current suicidal and homicidal plan or intent.   She has been on Prozac over the past several years with minimal relief.   Past Medical History:  Diagnosis Date  . Abnormal Pap smear of cervix   . Anxiety   . Depression    Social History   Social History  . Marital status: Single    Spouse name: N/A  . Number of children: N/A  . Years of education: N/A   Occupational History  . Not on file.   Social History Main Topics  . Smoking status: Never Smoker  . Smokeless tobacco: Never Used  . Alcohol use Yes     Comment: socially   . Drug use: No  . Sexual activity: Yes    Birth control/ protection: Surgical, IUD   Other Topics Concern  . Not on file   Social History Narrative   Marital status: single; dating seriously x 7 years.      Children:  2 children (14, 6)      Lives: with two sons      Employment:  Conservation officer, nature at [employer]; first shift for 8 years.      Tobacco: none      Alcohol:  4-6 drinks per month      Drugs: none      Exercise:  Sporadic; twice weekly; walking      Seatbelt: 100%      Guns: none      Sexual activity:  Total partners = 6; history of Chlamydia in 2002; last STD screening 2016; males only   Review of Systems  Constitutional: Negative.   HENT: Negative.   Eyes:  Negative.   Respiratory: Negative.   Cardiovascular: Negative.   Gastrointestinal: Negative.   Endocrine: Negative.   Genitourinary: Negative.   Musculoskeletal: Negative.   Skin: Negative.   Neurological: Negative.   Hematological: Negative.   Psychiatric/Behavioral: Negative.        Objective:   Physical Exam  Constitutional: She is oriented to person, place, and time. She appears well-developed and well-nourished.  HENT:  Head: Normocephalic and atraumatic.  Right Ear: External ear normal.  Left Ear: External ear normal.  Nose: Nose normal.  Mouth/Throat: Oropharynx is clear and moist.  Eyes: Pupils are equal, round, and reactive to light. Conjunctivae and EOM are normal.  Neck: Normal range of motion. Neck supple.  Cardiovascular: Normal rate, normal heart sounds and intact distal pulses.   Pulmonary/Chest: Effort normal and breath sounds normal.  Abdominal: Soft. Bowel sounds are normal.  Musculoskeletal: Normal range of motion.  Neurological: She is alert and oriented to person, place, and time. She has normal reflexes.  Skin: Skin is warm and dry.  Psychiatric: She has a normal mood and affect. Her behavior is normal.  Judgment and thought content normal.     BP 110/72 (BP Location: Left Arm, Patient Position: Sitting, Cuff Size: Large)   Pulse 80   Temp 98.3 F (36.8 C) (Oral)   Resp 14   Ht 5\' 1"  (1.549 m)   Wt 237 lb (107.5 kg)   LMP 09/06/2016   SpO2 95%   BMI 44.78 kg/m  Assessment & Plan:  1. Morbid obesity ([name]) Recommend a lowfat, low carbohydrate diet divided over 5-6 small meals, increase water intake to 6-8 glasses, and 150 minutes per week of cardiovascular exercise.  1800 calorie diet discussed. Patient given copy of meal plan. I will also send a referral to nutrition for further diet education.   - Ambulatory referral to diabetic education - HgB A1c  2. Depression, unspecified depression type [facility], she is requesting a referral to psychiatry. Will add Wellbutrin to medication regimen.  - buPROPion (WELLBUTRIN XL) 150 MG 24 hr tablet; Take 1 tablet (150 mg total) by mouth daily.  Dispense: 30 tablet; Refill: 5 - Ambulatory referral to Psychiatry  [facility] 10/07/2016 10/07/2016 03/15/2016 11/17/2014  Decreased Interest 2 0 (No Data) 0  Down, Depressed, Hopeless 1 0 (No Data) 0  PHQ - 2 Score 3 0 - 0  Altered sleeping 0 - - -  Tired, decreased energy 0 - - -  Change in appetite 2 - - -  Feeling bad or failure about yourself  1 - - -  Trouble concentrating 1 - - -  Moving slowly or fidgety/restless 0 - - -  Suicidal thoughts 1 - - -  PHQ-9 Score 8 - - -    RTC: 1 month for pap smear   Nolon NationsLaChina Sisemore [name]  MSN, FNP-C [facility] [address]  [place], [place] [id number] [phone]

## 2016-10-07 NOTE — Patient Instructions (Addendum)
Will start a trial of Wellbutrin 150 mg XL daily. Wellbutrin is a medication most commonly prescribed to treat depression, anxiety and certain eating disorders. Wellbutrin exerts its influence on mood and weight by altering specific neurotransmitters and by acting as a norepinephrine and dopamine uptake inhibitor. Wellbutrin basically acts to increase the amount of adrenaline (norepinephrine) at the neurotransmitter synpase and it is likely through this influence that it helps modulate appetite and increase metabolism leading to weight loss.   Calorie Counting for Weight Loss Calories are units of energy. Your body needs a certain amount of calories from food to keep you going throughout the day. When you eat more calories than your body needs, your body stores the extra calories as fat. When you eat fewer calories than your body needs, your body burns fat to get the energy it needs. Calorie counting means keeping track of how many calories you eat and drink each day. Calorie counting can be helpful if you need to lose weight. If you make sure to eat fewer calories than your body needs, you should lose weight. Ask your health care provider what a healthy weight is for you. For calorie counting to work, you will need to eat the right number of calories in a day in order to lose a healthy amount of weight per week. A dietitian can help you determine how many calories you need in a day and will give you suggestions on how to reach your calorie goal.  A healthy amount of weight to lose per week is usually 1-2 lb (0.5-0.9 kg). This usually means that your daily calorie intake should be reduced by 500-750 calories.  Eating 1,200 - 1,500 calories per day can help most women lose weight.  Eating 1,500 - 1,800 calories per day can help most men lose weight.  What is my plan? My goal is to have __________ calories per day. If I have this many calories per day, I should lose around __________ pounds per  week. What do I need to know about calorie counting? In order to meet your daily calorie goal, you will need to:  Find out how many calories are in each food you would like to eat. Try to do this before you eat.  Decide how much of the food you plan to eat.  Write down what you ate and how many calories it had. Doing this is called keeping a food log.  To successfully lose weight, it is important to balance calorie counting with a healthy lifestyle that includes regular activity. Aim for 150 minutes of moderate exercise (such as walking) or 75 minutes of vigorous exercise (such as running) each week. Where do I find calorie information?  The number of calories in a food can be found on a Nutrition Facts label. If a food does not have a Nutrition Facts label, try to look up the calories online or ask your dietitian for help. Remember that calories are listed per serving. If you choose to have more than one serving of a food, you will have to multiply the calories per serving by the amount of servings you plan to eat. For example, the label on a package of bread might say that a serving size is 1 slice and that there are 90 calories in a serving. If you eat 1 slice, you will have eaten 90 calories. If you eat 2 slices, you will have eaten 180 calories. How do I keep a food log? Immediately after each meal,  record the following information in your food log:  What you ate. Don't forget to include toppings, sauces, and other extras on the food.  How much you ate. This can be measured in cups, ounces, or number of items.  How many calories each food and drink had.  The total number of calories in the meal.  Keep your food log near you, such as in a small notebook in your pocket, or use a mobile app or website. Some programs will calculate calories for you and show you how many calories you have left for the day to meet your goal. What are some calorie counting tips?  Use your calories on foods  and drinks that will fill you up and not leave you hungry: ? Some examples of foods that fill you up are nuts and nut butters, vegetables, lean proteins, and high-fiber foods like whole grains. High-fiber foods are foods with more than 5 g fiber per serving. ? Drinks such as sodas, specialty coffee drinks, alcohol, and juices have a lot of calories, yet do not fill you up.  Eat nutritious foods and avoid empty calories. Empty calories are calories you get from foods or beverages that do not have many vitamins or protein, such as candy, sweets, and soda. It is better to have a nutritious high-calorie food (such as an avocado) than a food with few nutrients (such as a bag of chips).  Know how many calories are in the foods you eat most often. This will help you calculate calorie counts faster.  Pay attention to calories in drinks. Low-calorie drinks include water and unsweetened drinks.  Pay attention to nutrition labels for "low fat" or "fat free" foods. These foods sometimes have the same amount of calories or more calories than the full fat versions. They also often have added sugar, starch, or salt, to make up for flavor that was removed with the fat.  Find a way of tracking calories that works for you. Get creative. Try different apps or programs if writing down calories does not work for you. What are some portion control tips?  Know how many calories are in a serving. This will help you know how many servings of a certain food you can have.  Use a measuring cup to measure serving sizes. You could also try weighing out portions on a kitchen scale. With time, you will be able to estimate serving sizes for some foods.  Take some time to put servings of different foods on your favorite plates, bowls, and cups so you know what a serving looks like.  Try not to eat straight from a bag or box. Doing this can lead to overeating. Put the amount you would like to eat in a cup or on a plate to make  sure you are eating the right portion.  Use smaller plates, glasses, and bowls to prevent overeating.  Try not to multitask (for example, watch TV or use your computer) while eating. If it is time to eat, sit down at a table and enjoy your food. This will help you to know when you are full. It will also help you to be aware of what you are eating and how much you are eating. What are tips for following this plan? Reading food labels  Check the calorie count compared to the serving size. The serving size may be smaller than what you are used to eating.  Check the source of the calories. Make sure the food you are  eating is high in vitamins and protein and low in saturated and trans fats. Shopping  Read nutrition labels while you shop. This will help you make healthy decisions before you decide to purchase your food.  Make a grocery list and stick to it. Cooking  Try to cook your favorite foods in a healthier way. For example, try baking instead of frying.  Use low-fat dairy products. Meal planning  Use more fruits and vegetables. Half of your plate should be fruits and vegetables.  Include lean proteins like poultry and fish. How do I count calories when eating out?  Ask for smaller portion sizes.  Consider sharing an entree and sides instead of getting your own entree.  If you get your own entree, eat only half. Ask for a box at the beginning of your meal and put the rest of your entree in it so you are not tempted to eat it.  If calories are listed on the menu, choose the lower calorie options.  Choose dishes that include vegetables, fruits, whole grains, low-fat dairy products, and lean protein.  Choose items that are boiled, broiled, grilled, or steamed. Stay away from items that are buttered, battered, fried, or served with cream sauce. Items labeled "crispy" are usually fried, unless stated otherwise.  Choose water, low-fat milk, unsweetened iced tea, or other drinks  without added sugar. If you want an alcoholic beverage, choose a lower calorie option such as a glass of wine or light beer.  Ask for dressings, sauces, and syrups on the side. These are usually high in calories, so you should limit the amount you eat.  If you want a salad, choose a garden salad and ask for grilled meats. Avoid extra toppings like bacon, cheese, or fried items. Ask for the dressing on the side, or ask for olive oil and vinegar or lemon to use as dressing.  Estimate how many servings of a food you are given. For example, a serving of cooked rice is  cup or about the size of half a baseball. Knowing serving sizes will help you be aware of how much food you are eating at restaurants. The list below tells you how big or small some common portion sizes are based on everyday objects: ? 1 oz-4 stacked dice. ? 3 oz-1 deck of cards. ? 1 tsp-1 die. ? 1 Tbsp- a ping-pong ball. ? 2 Tbsp-1 ping-pong ball. ?  cup- baseball. ? 1 cup-1 baseball. Summary  Calorie counting means keeping track of how many calories you eat and drink each day. If you eat fewer calories than your body needs, you should lose weight.  A healthy amount of weight to lose per week is usually 1-2 lb (0.5-0.9 kg). This usually means reducing your daily calorie intake by 500-750 calories.  The number of calories in a food can be found on a Nutrition Facts label. If a food does not have a Nutrition Facts label, try to look up the calories online or ask your dietitian for help.  Use your calories on foods and drinks that will fill you up, and not on foods and drinks that will leave you hungry.  Use smaller plates, glasses, and bowls to prevent overeating. This information is not intended to replace advice given to you by your health care provider. Make sure you discuss any questions you have with your health care provider. Document Released: 03/14/2005 Document Revised: 02/12/2016 Document Reviewed:  02/12/2016 Elsevier Interactive Patient Education  2017 [employer].

## 2016-11-03 ENCOUNTER — Ambulatory Visit: Payer: Medicaid Other | Admitting: Registered"

## 2016-11-07 ENCOUNTER — Ambulatory Visit: Payer: Medicaid Other | Admitting: Family Medicine

## 2016-11-25 ENCOUNTER — Ambulatory Visit: Payer: Medicaid Other | Admitting: Family Medicine

## 2017-01-30 ENCOUNTER — Ambulatory Visit (HOSPITAL_COMMUNITY)
Admission: RE | Admit: 2017-01-30 | Discharge: 2017-01-30 | Disposition: A | Discharge status: Home / Self Care | Payer: Medicaid Other | Source: Ambulatory Visit | Attending: Family Medicine | Admitting: Family Medicine

## 2017-01-30 ENCOUNTER — Encounter: Payer: Self-pay | Admitting: Family Medicine

## 2017-01-30 ENCOUNTER — Ambulatory Visit (INDEPENDENT_AMBULATORY_CARE_PROVIDER_SITE_OTHER): Payer: Medicaid Other | Admitting: Family Medicine

## 2017-01-30 VITALS — BP 116/67 | HR 80 | Temp 98.1°F | Resp 18 | Ht 61.0 in | Wt 236.0 lb

## 2017-01-30 DIAGNOSIS — M25562 Pain in left knee: Secondary | ICD-10-CM | POA: Insufficient documentation

## 2017-01-30 DIAGNOSIS — M62830 Muscle spasm of back: Secondary | ICD-10-CM | POA: Diagnosis not present

## 2017-01-30 DIAGNOSIS — M545 Low back pain: Secondary | ICD-10-CM

## 2017-01-30 DIAGNOSIS — G8929 Other chronic pain: Secondary | ICD-10-CM

## 2017-01-30 LAB — POCT URINALYSIS DIP (DEVICE)
GLUCOSE, UA: NEGATIVE mg/dL
KETONES UR: NEGATIVE mg/dL
LEUKOCYTES UA: NEGATIVE
Nitrite: NEGATIVE
Protein, ur: NEGATIVE mg/dL
Urobilinogen, UA: 0.2 mg/dL (ref 0.0–1.0)
pH: 5.5 (ref 5.0–8.0)

## 2017-01-30 MED ORDER — CYCLOBENZAPRINE HCL 5 MG PO TABS: 5.0000 mg | ORAL_TABLET | Freq: Three times a day (TID) | ORAL | 1 refills | Status: DC | PRN

## 2017-01-30 MED ORDER — MELOXICAM 15 MG PO TABS: 15.0000 mg | ORAL_TABLET | Freq: Every day | ORAL | 1 refills | Status: DC

## 2017-01-30 MED ORDER — KETOROLAC TROMETHAMINE 60 MG/2ML IM SOLN
60.0000 mg | Freq: Once | INTRAMUSCULAR | Status: AC
  Administered 2017-01-30: 60 mg via INTRAMUSCULAR

## 2017-01-30 NOTE — Progress Notes (Signed)
Subjective:    Jade [name] is a 41 y.o. female who presents for evaluation of low back pain. The patient has had a history of prior  back problems.  Symptoms have been present for several days and have been worsening.  Patient has not identified any  provocative factors related to current symptoms.  Patient denies previous injury.  The pain is located in the lower back and radiates to the left knee.  Pain is described as constant and throbbing. Symptoms are exacerbated by extension, lifting, lying down, sitting, standing and straining. Symptoms are improved by rest She has also tried NSAIDs which provided no symptom relief. She has tingling in the left leg associated with the back.  She denies abdominal pain, dysuria, incontinence of urine or stool or constipation.     Past Medical History:  Diagnosis Date  . Abnormal Pap smear of cervix   . Anxiety   . Depression    Social History   Socioeconomic History  . Marital status: Single    Spouse name: Not on file  . Number of children: Not on file  . Years of education: Not on file  . Highest education level: Not on file  Social Needs  . Financial resource strain: Not on file  . Food insecurity - worry: Not on file  . Food insecurity - inability: Not on file  . Transportation needs - medical: Not on file  . Transportation needs - non-medical: Not on file  Occupational History  . Not on file  Tobacco Use  . Smoking status: Never Smoker  . Smokeless tobacco: Never Used  Substance and Sexual Activity  . Alcohol use: Yes    Comment: socially   . Drug use: No  . Sexual activity: Yes    Birth control/protection: Surgical, IUD  Other Topics Concern  . Not on file  Social History Narrative   Marital status: single; dating seriously x 7 years.      Children:  2 children (14, 6)      Lives: with two sons      Employment:  Conservation officer, nature at [employer]; first shift for 8 years.      Tobacco: none      Alcohol:  4-6 drinks per month      Drugs:  none      Exercise:  Sporadic; twice weekly; walking      Seatbelt: 100%      Guns: none      Sexual activity:  Total partners = 6; history of Chlamydia in 2002; last STD screening 2016; males only   Immunization History  Administered Date(s) Administered  . Influenza,inj,Quad PF,6+ Mos 03/15/2016  . Tdap 06/23/2014  Review of Systems  HENT: Negative.   Eyes: Negative.   Respiratory: Negative.   Cardiovascular: Negative.   Gastrointestinal: Negative.   Genitourinary: Negative.   Musculoskeletal: Positive for back pain and myalgias.  Skin: Negative.   Neurological: Negative.  Negative for dizziness, weakness and headaches.  Psychiatric/Behavioral: Negative.    Objective:   Full range of motion without pain, no tenderness, no spasm, no curvature. Normal reflexes, gait, strength and negative straight-leg raise. Inspection and palpation: inspection of back is normal, no tenderness noted, antalgic gait. Muscle tone and ROM exam: muscle spasm noted ., full range of motion with pain. Straight leg raise: positive at 90 degrees bilaterally. Neurological: normal DTRs, muscle strength and reflexes. Physical Exam  Constitutional: She is oriented to person, place, and time.  HENT:  Head: Normocephalic and atraumatic.  Right Ear: External ear normal.  Left Ear: External ear normal.  Nose: Nose normal.  Mouth/Throat: Oropharynx is clear and moist.  Eyes: Pupils are equal, round, and reactive to light.  Neck: Normal range of motion. Neck supple.  Cardiovascular: Normal rate, regular rhythm, normal heart sounds and intact distal pulses.  Pulmonary/Chest: Effort normal and breath sounds normal.  Abdominal: Soft. Bowel sounds are normal.  Musculoskeletal: She exhibits no tenderness or deformity.       Lumbar back: She exhibits decreased range of motion, swelling, edema and spasm.  Neurological: She is alert and oriented to person, place, and time. She has normal reflexes.  Skin: Skin is warm  and dry.  Psychiatric: She has a normal mood and affect. Her behavior is normal. Judgment and thought content normal.      Assessment:   BP 116/67 (BP Location: Left Arm, Patient Position: Sitting, Cuff Size: Large)   Pulse 80   Temp 98.1 F (36.7 C) (Oral)   Resp 18   Ht 5\' 1"  (1.549 m)   Wt 236 lb (107 kg)   SpO2 100%   BMI 44.59 kg/m   Plan:    1. Chronic bilateral low back pain without sciatica Will start a trial of meloxicam 15 mg daily for inflammation. Patient received Toradol 60 mg IM without complication. - ketorolac (TORADOL) injection 60 mg - meloxicam (MOBIC) 15 MG tablet; Take 1 tablet (15 mg total) daily by mouth.  Dispense: 30 tablet; Refill: 1  2. Chronic pain of left knee We will follow-up by phone after reviewing x-ray results. - DG Knee Complete 4 Views Left; Future - meloxicam (MOBIC) 15 MG tablet; Take 1 tablet (15 mg total) daily by mouth.  Dispense: 30 tablet; Refill: 1  3. Spasm of muscle of lower back Will start a trial of Flexeril 5 mg 3 times a day as needed for muscle spasms of lower back. - cyclobenzaprine (FLEXERIL) 5 MG tablet; Take 1 tablet (5 mg total) 3 (three) times daily as needed by mouth for muscle spasms.  Dispense: 30 tablet; Refill: 1 Agricultural engineerducational material distributed. Proper lifting, bending technique discussed. Stretching exercises discussed. Short (2-4 day) period of relative rest recommended until acute symptoms improve. Ice to affected area as needed for local pain relief. NSAIDs per medication orders. Muscle relaxants per medication orders.   RTC: As needed   The patient was given clear instructions to go to ER or return to medical center if symptoms do not improve, worsen or new problems develop. The patient verbalized understanding.    Nolon NationsLaChina Sipp Koray Soter  MSN, FNP-C Patient Care [facility] [address]  [place], [place] [id number] [phone]

## 2017-01-30 NOTE — Patient Instructions (Signed)
For chronic pain of left knee, will send for an x-ray of left knee.  We will follow-up by phone after reviewing. We will start a trial of meloxicam 15 mg daily for moderate to severe left knee pain.  Take medication with food.  Muscle spasms of lower back: We will start a trial of cyclobenzaprine 5 mg 3 times a day as needed.  Refrain from drinking driving or operating machinery while taking this medication.  Recommend a ketogenic diet: Example for breakfast 2 slices of bacon and one boiled egg, snack 1 bag of almonds, lunch small salad without carbohydrates, snack 1 cup of tuna, dinner meat and veggies. Increase water intake to 6-8 glasses.  No soda or juice.

## 2017-02-20 ENCOUNTER — Ambulatory Visit (HOSPITAL_COMMUNITY)
Admission: EM | Admit: 2017-02-20 | Discharge: 2017-02-20 | Disposition: A | Discharge status: Home / Self Care | Payer: Medicaid Other

## 2017-02-20 ENCOUNTER — Encounter (HOSPITAL_COMMUNITY): Payer: Self-pay | Admitting: Emergency Medicine

## 2017-02-20 DIAGNOSIS — S20219A Contusion of unspecified front wall of thorax, initial encounter: Secondary | ICD-10-CM | POA: Diagnosis not present

## 2017-02-20 DIAGNOSIS — S161XXA Strain of muscle, fascia and tendon at neck level, initial encounter: Secondary | ICD-10-CM
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
## 2017-02-20 NOTE — Discharge Instructions (Signed)
Ice chest, heat to neck. If he develops additional chest pain, heaviness or tightness or pressure, shortness of breath, hard brief or other problems return or go to the emergency department.
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
## 2017-02-24 ENCOUNTER — Ambulatory Visit: Payer: Medicaid Other | Admitting: Family Medicine

## 2017-05-03 ENCOUNTER — Ambulatory Visit: Payer: Medicaid Other | Admitting: Family Medicine

## 2017-05-03 ENCOUNTER — Encounter: Payer: Self-pay | Admitting: Family Medicine

## 2017-05-03 VITALS — BP 115/80 | HR 83 | Temp 98.1°F | Resp 18 | Ht 61.0 in | Wt 231.8 lb

## 2017-05-03 DIAGNOSIS — N921 Excessive and frequent menstruation with irregular cycle: Secondary | ICD-10-CM

## 2017-05-03 NOTE — Progress Notes (Signed)
Subjective:     Jade [name] is a 42 y.o. woman with a history of morbid obesity who presents for irregular menses. Patient's last menstrual period was 04/21/2017 (exact date).  Periods are irregular, lasting for greater than 1 month. Patient denies abnormal discharge, abdominal pain, pelvic pain, or abdominal cramping. Patient denies anxiety, changes in libido, depression, diarrhea, fluid retention, headache, insomnia, moodiness, pelvic pain, tension and weight gain. Patient has had mirena IUD for greater than 2 years.  Past Medical History:  Diagnosis Date  . Abnormal Pap smear of cervix   . Anxiety   . Depression    Immunization History  Administered Date(s) Administered  . Influenza,inj,Quad PF,6+ Mos 03/15/2016  . Tdap 06/23/2014   Social History   Socioeconomic History  . Marital status: Single    Spouse name: Not on file  . Number of children: Not on file  . Years of education: Not on file  . Highest education level: Not on file  Social Needs  . Financial resource strain: Not on file  . Food insecurity - worry: Not on file  . Food insecurity - inability: Not on file  . Transportation needs - medical: Not on file  . Transportation needs - non-medical: Not on file  Occupational History  . Not on file  Tobacco Use  . Smoking status: Never Smoker  . Smokeless tobacco: Never Used  Substance and Sexual Activity  . Alcohol use: No    Frequency: Never    Comment: socially   . Drug use: No  . Sexual activity: Yes    Birth control/protection: Surgical, IUD  Other Topics Concern  . Not on file  Social History Narrative   Marital status: single; dating seriously x 7 years.      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      Tobacco: none      Alcohol:  4-6 drinks per month      Drugs: none      Exercise:  Sporadic; twice weekly; walking      Seatbelt: 100%      Guns: none      Sexual activity:   Total partners = 6; history of Chlamydia in 2002; last STD screening 2016; males only  Review of Systems  Constitutional: Negative.   HENT: Negative.   Eyes: Negative.   Respiratory: Negative.   Gastrointestinal: Negative.   Genitourinary: Negative for dysuria, flank pain, frequency, hematuria and urgency.       Menorrhagia  Musculoskeletal: Negative.   Skin: Negative.   Neurological: Negative.   Psychiatric/Behavioral: Negative.     Objective:  Physical Exam  Constitutional: She is oriented to person, place, and time and well-developed, well-nourished, and in no distress.  HENT:  Head: Normocephalic and atraumatic.  Right Ear: External ear normal.  Left Ear: External ear normal.  Nose: Nose normal.  Mouth/Throat: Oropharynx is clear and moist.  Eyes: Pupils are equal, round, and reactive to light.  Neck: Normal range of motion. Neck supple.  Cardiovascular: Normal rate, regular rhythm, normal heart sounds and intact distal pulses.  Pulmonary/Chest: Effort normal and breath sounds normal.  Abdominal: Soft. Bowel sounds are normal.  Neurological: She is alert and oriented to person, place, and time. Gait normal.  Skin: Skin is warm and dry.  Psychiatric: Mood, memory, affect and judgment normal.    Assessment:  The patient has menorrhagia.   BP 115/80   Pulse 83   Temp 98.1 F (36.7 C) (Oral)   Resp 18   Ht 5\' 1"  (1.549 m)   Wt 231 lb 12.8 oz (105.1 kg)   LMP 04/21/2017 (Exact Date)   SpO2 100%   BMI 43.80 kg/m  Plan:  1. Menorrhagia with irregular cycle Follow up with Dr. Debroah Loop, gynecology as scheduled for removal of mirena IUD - CBC  2. Morbid obesity ([name]) Patient has decrease weight by 5 pounds since previous visit. Weight loss goal is 10 pounds over the next 6 months.  Recommend a lowfat, low carbohydrate diet divided over 5-6 small meals, increase water intake to 6-8 glasses, and 150 minutes per week of cardiovascular exercise.    Diagnosis explained in  detail. All questions answered.     Nolon Nations  MSN, FNP-C Patient Care [facility] [address] [place], [place] [address] [phone]    The patient was given clear instructions to go to ER or return to medical center if symptoms do not improve, worsen or new problems develop. The patient verbalized understanding.

## 2017-05-03 NOTE — Patient Instructions (Signed)
We will follow-up by phone with any abnormal laboratory results.  Follow-up with your gynecologist in 1 week as scheduled.  Menorrhagia Menorrhagia is when your menstrual periods are heavy or last longer than usual. Follow these instructions at home:  Only take medicine as told by your doctor.  Take any iron pills as told by your doctor. Heavy bleeding may cause low levels of iron in your body.  Do not take aspirin 1 week before or during your period. Aspirin can make the bleeding worse.  Lie down for a while if you change your tampon or pad more than once in 2 hours. This may help lessen the bleeding.  Eat a healthy diet and foods with iron. These foods include leafy green vegetables, meat, liver, eggs, and whole grain breads and cereals.  Do not try to lose weight. Wait until the heavy bleeding has stopped and your iron level is normal. Contact a doctor if:  You soak through a pad or tampon every 1 or 2 hours, and this happens every time you have a period.  You need to use pads and tampons at the same time because you are bleeding so much.  You need to change your pad or tampon during the night.  You have a period that lasts for more than 8 days.  You pass clots bigger than 1 inch (2.5 cm) wide.  You have irregular periods that happen more or less often than once a month.  You feel dizzy or pass out (faint).  You feel very weak or tired.  You feel short of breath or feel your heart is beating too fast when you exercise.  You feel sick to your stomach (nausea) and you throw up (vomit) while you are taking your medicine.  You have watery poop (diarrhea) while you are taking your medicine.  You have any problems that may be related to the medicine you are taking. Get help right away if:  You soak through 4 or more pads or tampons in 2 hours.  You have any bleeding while you are pregnant. This information is not intended to replace advice given to you by your health care  provider. Make sure you discuss any questions you have with your health care provider. Document Released: 12/22/2007 Document Revised: 08/20/2015 Document Reviewed: 09/13/2012 Elsevier Interactive Patient Education  2017 [employer]

## 2017-05-04 ENCOUNTER — Telehealth: Payer: Self-pay

## 2017-05-04 LAB — CBC
HEMOGLOBIN: 13.8 g/dL (ref 11.1–15.9)
Hematocrit: 42 % (ref 34.0–46.6)
MCH: 30.1 pg (ref 26.6–33.0)
MCHC: 32.9 g/dL (ref 31.5–35.7)
MCV: 92 fL (ref 79–97)
Platelets: 305 10*3/uL (ref 150–379)
RBC: 4.58 x10E6/uL (ref 3.77–5.28)
RDW: 13.8 % (ref 12.3–15.4)
WBC: 6.8 10*3/uL (ref 3.4–10.8)

## 2017-05-04 NOTE — Telephone Encounter (Signed)
Patient notified

## 2017-05-04 NOTE — Telephone Encounter (Signed)
-----   Message from Massie MaroonLachina M Hollis, [place]NP sent at 05/04/2017  6:14 AM EST ----- Regarding: lab results Please inform patient that hemoglobin level is within a normal range. No treatment warranted at this time. Please establish care with gynecology as scheduled.   Thanks

## 2017-05-10 ENCOUNTER — Other Ambulatory Visit (HOSPITAL_COMMUNITY)
Admission: RE | Admit: 2017-05-10 | Discharge: 2017-05-10 | Disposition: A | Discharge status: Home / Self Care | Payer: Medicaid Other | Source: Ambulatory Visit | Attending: Obstetrics & Gynecology | Admitting: Obstetrics & Gynecology

## 2017-05-10 ENCOUNTER — Ambulatory Visit (INDEPENDENT_AMBULATORY_CARE_PROVIDER_SITE_OTHER): Payer: Medicaid Other | Admitting: Obstetrics & Gynecology

## 2017-05-10 ENCOUNTER — Encounter: Payer: Self-pay | Admitting: Obstetrics & Gynecology

## 2017-05-10 DIAGNOSIS — N938 Other specified abnormal uterine and vaginal bleeding: Secondary | ICD-10-CM

## 2017-05-10 DIAGNOSIS — N898 Other specified noninflammatory disorders of vagina: Secondary | ICD-10-CM

## 2017-05-10 DIAGNOSIS — B9689 Other specified bacterial agents as the cause of diseases classified elsewhere: Secondary | ICD-10-CM | POA: Insufficient documentation

## 2017-05-10 DIAGNOSIS — N760 Acute vaginitis: Secondary | ICD-10-CM | POA: Insufficient documentation

## 2017-05-10 DIAGNOSIS — R87613 High grade squamous intraepithelial lesion on cytologic smear of cervix (HGSIL): Secondary | ICD-10-CM

## 2017-05-10 DIAGNOSIS — Z87410 Personal history of cervical dysplasia: Secondary | ICD-10-CM | POA: Insufficient documentation

## 2017-05-10 NOTE — Patient Instructions (Signed)
Cervical Dysplasia Cervical dysplasia is a condition in which a woman's cervix cells have abnormal changes. The cervix is the opening of the uterus (womb). It is located between the vagina and the uterus. Cervical dysplasia may be an early sign of cervical cancer. If left untreated, this condition may become more severe and may progress to cervical cancer. Early detection, treatment, and follow-up care are very important. What are the causes? Cervical dysplasia can be caused by a human papillomavirus (HPV) infection. HPV is the most common sexually transmitted infection (STI). HPV is spread from person to person through sexual contact. This includes oral, vaginal, or anal sex. What increases the risk? The following factors may make you more likely to develop this condition:  Having had a sexually transmitted infection (STI), such as herpes, chlamydia or HPV.  Becoming sexually active before age 18.  Having had more than one sexual partner.  Having a sexual partner who has multiple sexual partners.  Not using protection, such as a condom, during sex, especially with new sexual partners.  Having a history of cancer of the vagina or vulva.  Having a weakened body defense (immune) system.  Being the daughter of a woman who took diethylstilbestrol (DES) during pregnancy.  Having a family history of cervical cancer.  Smoking.  Using oral contraceptives, also called birth control pills.  Having had three or more full-term pregnancies.  What are the signs or symptoms? There are usually no symptoms of this condition. If you do have symptoms, they may include:  Abnormal vaginal discharge.  Bleeding between periods or after sex.  Bleeding during menopause.  Pain during sex (dyspareunia).  How is this diagnosed? A test called a Pap test may be done. During this test, cells are taken from the cervix and then examined under a microscope. A test in which tissue is removed from the cervix  (biopsy) may also be done if the Pap test is abnormal or if the cervix looks abnormal. How is this treated? Treatment varies based on the severity of the condition. Treatment may include:  Cryotherapy. During cryotherapy, the abnormal cells are frozen with a steel-tip instrument.  Loop electrosurgical excision procedure (LEEP). This procedure removes abnormal tissue from the cervix.  Surgery to remove abnormal tissue. This is usually done in more advanced cases. Surgical options include: ? A cone biopsy. This is a procedure in which the cervical canal and a portion of the center of the cervix are removed. ? Hysterectomy. This is a surgery in which the uterus and cervix are removed.  Follow these instructions at home:  Take over-the-counter and prescription medicines only as told by your health care provider.  Do not use tampons, have sex, or douche until your health care provider says it is okay.  Keep follow-up visits as told by your health care provider. This is important. Women who have been treated for cervical dysplasia should have regular pelvic exams and Pap tests as told by their health care provider. How is this prevented?  Practice safe sex to help prevent sexually transmitted infections (STI) that may cause this condition.  Have regular Pap tests. Talk with your health care provider about how often you need these tests. Pap tests will help identify cell changes that can lead to cancer. Contact a health care provider if:  You develop genital warts. Get help right away if:  You have a fever.  You have abnormal vaginal discharge.  Your menstrual period is heavier than normal.  You develop bright   red bleeding. This may include blood clots.  You have pain or cramps that get worse, and medicine does not help to relieve your pain.  You feel light-headed and you are unusually weak.  You have fainting spells.  You have pain in the abdomen. Summary  Cervical dysplasia  is a condition in which a woman's cervix cells have abnormal changes.  If left untreated, this condition may become more severe and may progress to cervical cancer.  Early detection, treatment, and follow-up care are very important in managing this condition.  Have regular pelvic exams and Pap tests. Talk with your health care provider about how often you need these tests. Pap tests will help identify cell changes that can lead to cancer. This information is not intended to replace advice given to you by your health care provider. Make sure you discuss any questions you have with your health care provider. Document Released: 03/14/2005 Document Revised: 03/17/2016 Document Reviewed: 03/17/2016 Elsevier Interactive Patient Education  2017 Elsevier Inc.  

## 2017-05-10 NOTE — Progress Notes (Signed)
Patient ID: Jade [name], female   DOB: 04-Mar-1976, 42 y.o.   MRN: [id number]  Chief Complaint  Patient presents with  . Gynecologic Exam  . AUB    Since Mirena insertion 2 years ago, h/o BTL.  Pt states Mirena was inserted to control AUB-not successful per pt.  . Vaginal Discharge    x 2 weeks, foul odor.  She reports recurrent BVag since Mirena insertion.    HPI Jade [name] is a 42 y.o. female.  [id number] Patient's last menstrual period was 04/21/2017 (exact date). S/p BTL, she has had Mirena in place for more than 2 years with no improvement in sx of heavy prolonged menses last 2-4 weeks. Patient request IUD removal today. HPI  Past Medical History:  Diagnosis Date  . Abnormal Pap smear of cervix   . Anxiety   . Depression     Past Surgical History:  Procedure Laterality Date  . CERVICAL BIOPSY  W/ LOOP ELECTRODE EXCISION  03-2013  . CESAREAN SECTION     x 1  . COLPOSCOPY    . TUBAL LIGATION      Family History  Problem Relation Age of Onset  . Hypertension Mother   . Diabetes Mother   . Hyperlipidemia Brother   . Pancreatic cancer Maternal Grandmother   . Diabetes Maternal Grandmother     Social History Social History   Tobacco Use  . Smoking status: Never Smoker  . Smokeless tobacco: Never Used  Substance Use Topics  . Alcohol use: No    Frequency: Never    Comment: socially   . Drug use: No    No Known Allergies  Current Outpatient Medications  Medication Sig Dispense Refill  . buPROPion (WELLBUTRIN XL) 150 MG 24 hr tablet Take 1 tablet (150 mg total) by mouth daily. 30 tablet 5  . cyclobenzaprine (FLEXERIL) 5 MG tablet Take 1 tablet (5 mg total) 3 (three) times daily as needed by mouth for muscle spasms. 30 tablet 1  . FLUoxetine (PROZAC) 20 MG capsule Take 30 mg by mouth daily.     Marland Kitchen. levonorgestrel (MIRENA) 20 MCG/24HR IUD 1 each by Intrauterine route once.    . meloxicam (MOBIC) 15 MG tablet Take 1 tablet (15 mg total) daily by mouth. 30 tablet  1   No current facility-administered medications for this visit.     Review of Systems Review of Systems  Constitutional: Negative for unexpected weight change.  Respiratory: Negative.   Cardiovascular: Negative.   Gastrointestinal: Negative.   Genitourinary: Positive for menstrual problem. Negative for pelvic pain and vaginal discharge.    Blood pressure 129/87, pulse 89, height 5\' 1"  (1.549 m), weight 232 lb 1.6 oz (105.3 kg), last menstrual period 04/21/2017.  Physical Exam Physical Exam  Constitutional: She appears well-developed. No distress.  obese  HENT:  Head: Normocephalic.  Neck: Normal range of motion.  Cardiovascular: Normal rate.  Pulmonary/Chest: Effort normal. No respiratory distress.  Abdominal: Soft. There is no tenderness.  Genitourinary: Vagina normal and uterus normal. No vaginal discharge found.  Vitals reviewed. Pap done, IUD string grasped with ring forceps and removed intact  Data Reviewed Pap results and LEEP result 2014, 2016  Assessment    AUB with Mirena in place, removed per request Obesity H/O CIN 2-3    Plan    Pap done. Routine f/u if normal BSE instructed F/u if DUB continues. She has considered ablation before.       Scheryl DarterJames Makisha Marrin 05/10/2017, 3:33 PM

## 2017-05-12 LAB — CERVICOVAGINAL ANCILLARY ONLY
Bacterial vaginitis: POSITIVE — AB
CANDIDA VAGINITIS: NEGATIVE
CHLAMYDIA, DNA PROBE: NEGATIVE
NEISSERIA GONORRHEA: NEGATIVE
Trichomonas: NEGATIVE

## 2017-05-15 LAB — CYTOLOGY - PAP
DIAGNOSIS: NEGATIVE
HPV: NOT DETECTED

## 2017-05-18 ENCOUNTER — Ambulatory Visit: Payer: Medicaid Other | Admitting: Obstetrics & Gynecology

## 2017-05-18 ENCOUNTER — Encounter: Payer: Self-pay | Admitting: Obstetrics & Gynecology

## 2017-05-18 DIAGNOSIS — N921 Excessive and frequent menstruation with irregular cycle: Secondary | ICD-10-CM | POA: Diagnosis not present

## 2017-05-18 MED ORDER — NORGESTIMATE-ETH ESTRADIOL 0.25-35 MG-MCG PO TABS: 1.0000 | ORAL_TABLET | Freq: Every day | ORAL | 11 refills | Status: DC

## 2017-05-18 NOTE — Progress Notes (Signed)
Cc: Pt here for consult to discuss options to help irregular / heavy bleeding [id number] Patient's last menstrual period was 04/21/2017 (approximate).     Pt had IUD removed on 05/10/2017. has since been bleeding since IUD removal. H/o BTL.  soaking 4 tampons in a hr, clots and cramps.  She is willing to take OCP to control bleeding but is interested in endometrial ablation  Blood pressure 126/81, pulse 90, weight 234 lb (106.1 kg), last menstrual period 04/21/2017. NAD, looks well, not pale Pelvic exam deferred Last Pap: 05/10/17, normal result Impression:  Patient Active Problem List   Diagnosis Date Noted  . Menometrorrhagia 05/18/2017  . Morbid obesity ([name]) 06/23/2014  . High grade squamous intraepithelial cervical dysplasia 08/01/2011  Plan: Take Sprintec 2 daily until bleeding stops then one a day continuously RTC for evaluation for endometrial ablation  Adam PhenixArnold, Vash Quezada G, MD 05/18/2017

## 2017-05-18 NOTE — Patient Instructions (Signed)
Endometrial Ablation Endometrial ablation is a procedure that destroys the thin inner layer of the lining of the uterus (endometrium). This procedure may be done:  To stop heavy periods.  To stop bleeding that is causing anemia.  To control irregular bleeding.  To treat bleeding caused by small tumors (fibroids) in the endometrium.  This procedure is often an alternative to major surgery, such as removal of the uterus and cervix (hysterectomy). As a result of this procedure:  You may not be able to have children. However, if you are premenopausal (you have not gone through menopause): ? You may still have a small chance of getting pregnant. ? You will need to use a reliable method of birth control after the procedure to prevent pregnancy.  You may stop having a menstrual period, or you may have only a small amount of bleeding during your period. Menstruation may return several years after the procedure.  Tell a health care provider about:  Any allergies you have.  All medicines you are taking, including vitamins, herbs, eye drops, creams, and over-the-counter medicines.  Any problems you or family members have had with the use of anesthetic medicines.  Any blood disorders you have.  Any surgeries you have had.  Any medical conditions you have. What are the risks? Generally, this is a safe procedure. However, problems may occur, including:  A hole (perforation) in the uterus or bowel.  Infection of the uterus, bladder, or vagina.  Bleeding.  Damage to other structures or organs.  An air bubble in the lung (air embolus).  Problems with pregnancy after the procedure.  Failure of the procedure.  Decreased ability to diagnose cancer in the endometrium.  What happens before the procedure?  You will have tests of your endometrium to make sure there are no pre-cancerous cells or cancer cells present.  You may have an ultrasound of the uterus.  You may be given  medicines to thin the endometrium.  Ask your health care provider about: ? Changing or stopping your regular medicines. This is especially important if you take diabetes medicines or blood thinners. ? Taking medicines such as aspirin and ibuprofen. These medicines can thin your blood. Do not take these medicines before your procedure if your doctor tells you not to.  Plan to have someone take you home from the hospital or clinic. What happens during the procedure?  You will lie on an exam table with your feet and legs supported as in a pelvic exam.  To lower your risk of infection: ? Your health care team will wash or sanitize their hands and put on germ-free (sterile) gloves. ? Your genital area will be washed with soap.  An IV tube will be inserted into one of your veins.  You will be given a medicine to help you relax (sedative).  A surgical instrument with a light and camera (resectoscope) will be inserted into your vagina and moved into your uterus. This allows your surgeon to see inside your uterus.  Endometrial tissue will be removed using one of the following methods: ? Radiofrequency. This method uses a radiofrequency-alternating electric current to remove the endometrium. ? Cryotherapy. This method uses extreme cold to freeze the endometrium. ? Heated-free liquid. This method uses a heated saltwater (saline) solution to remove the endometrium. ? Microwave. This method uses high-energy microwaves to heat up the endometrium and remove it. ? Thermal balloon. This method involves inserting a catheter with a balloon tip into the uterus. The balloon tip is   filled with heated fluid to remove the endometrium. The procedure may vary among health care providers and hospitals. What happens after the procedure?  Your blood pressure, heart rate, breathing rate, and blood oxygen level will be monitored until the medicines you were given have worn off.  As tissue healing occurs, you may  notice vaginal bleeding for 4-6 weeks after the procedure. You may also experience: ? Cramps. ? Thin, watery vaginal discharge that is light pink or brown in color. ? A need to urinate more frequently than usual. ? Nausea.  Do not drive for 24 hours if you were given a sedative.  Do not have sex or insert anything into your vagina until your health care provider approves. Summary  Endometrial ablation is done to treat the many causes of heavy menstrual bleeding.  The procedure may be done only after medications have been tried to control the bleeding.  Plan to have someone take you home from the hospital or clinic. This information is not intended to replace advice given to you by your health care provider. Make sure you discuss any questions you have with your health care provider. Document Released: 01/22/2004 Document Revised: 03/31/2016 Document Reviewed: 03/31/2016 Elsevier Interactive Patient Education  2017 Elsevier Inc.  

## 2017-06-12 ENCOUNTER — Encounter: Payer: Self-pay | Admitting: Obstetrics & Gynecology

## 2017-06-12 ENCOUNTER — Ambulatory Visit (INDEPENDENT_AMBULATORY_CARE_PROVIDER_SITE_OTHER): Payer: Medicaid Other | Admitting: Obstetrics & Gynecology

## 2017-06-12 VITALS — BP 105/71 | HR 82 | Wt 234.8 lb

## 2017-06-12 DIAGNOSIS — N921 Excessive and frequent menstruation with irregular cycle: Secondary | ICD-10-CM | POA: Diagnosis not present

## 2017-06-12 NOTE — Progress Notes (Signed)
Subjective:     Patient ID: Jade [name], female   DOB: December 31, 1975, 42 y.o.   MRN: [id number] Cc: f/u for DUB [id number] Patient's last menstrual period was 04/13/2017. No vaginal bleeding after taking OCP for 4-5 days and has taken the pill continuously since that time. She is satisfied with the result and would like to continue taking Sprintec for cycle control. No Known Allergies Past Medical History:  Diagnosis Date  . Abnormal Pap smear of cervix   . Anxiety   . Depression    Past Surgical History:  Procedure Laterality Date  . CERVICAL BIOPSY  W/ LOOP ELECTRODE EXCISION  03-2013  . CESAREAN SECTION     x 1  . COLPOSCOPY    . TUBAL LIGATION     Social History   Socioeconomic History  . Marital status: Single    Spouse name: Not on file  . Number of children: Not on file  . Years of education: Not on file  . Highest education level: Not on file  Social Needs  . Financial resource strain: Not on file  . Food insecurity - worry: Not on file  . Food insecurity - inability: Not on file  . Transportation needs - medical: Not on file  . Transportation needs - non-medical: Not on file  Occupational History  . Not on file  Tobacco Use  . Smoking status: Never Smoker  . Smokeless tobacco: Never Used  Substance and Sexual Activity  . Alcohol use: No    Frequency: Never    Comment: socially   . Drug use: No  . Sexual activity: Yes    Birth control/protection: Surgical, Pill  Other Topics Concern  . Not on file  Social History Narrative   Marital status: single; dating seriously x 7 years.      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      Tobacco: none      Alcohol:  4-6 drinks per month      Drugs: none      Exercise:  Sporadic; twice weekly; walking      Seatbelt: 100%      Guns: none      Sexual activity:  Total partners = 6; history of Chlamydia in 2002; last STD screening 2016; males  only      Review of Systems  Constitutional: Negative.   Gastrointestinal: Negative.   Endocrine: Negative.   Genitourinary: Negative.        Objective:   Physical Exam  Constitutional: She is oriented to person, place, and time. She appears well-developed. No distress.  Cardiovascular: Normal rate.  Pulmonary/Chest: Effort normal. No respiratory distress.  Neurological: She is alert and oriented to person, place, and time.  Psychiatric: She has a normal mood and affect. Her behavior is normal.  Vitals reviewed.      Assessment     Doing well on current OCP    Plan:     Continue Sprintec for cycle control  Pap was normal, routine f/u 10 minutes  Adam PhenixArnold, James G, MD 06/12/2017

## 2017-08-25 ENCOUNTER — Ambulatory Visit: Payer: Medicaid Other | Admitting: Family Medicine

## 2017-11-01 ENCOUNTER — Ambulatory Visit: Payer: Medicaid Other | Admitting: Family Medicine

## 2018-02-01 ENCOUNTER — Telehealth: Payer: Self-pay

## 2018-02-01 NOTE — Telephone Encounter (Signed)
Patient will be at appointment at 8am on 02/12/2018.

## 2018-02-05 ENCOUNTER — Ambulatory Visit (INDEPENDENT_AMBULATORY_CARE_PROVIDER_SITE_OTHER): Payer: BLUE CROSS/BLUE SHIELD | Admitting: Family Medicine

## 2018-02-05 ENCOUNTER — Encounter: Payer: Self-pay | Admitting: Family Medicine

## 2018-02-05 VITALS — BP 108/74 | HR 78 | Temp 98.1°F | Resp 14 | Ht 61.0 in | Wt 240.0 lb

## 2018-02-05 DIAGNOSIS — F32A Depression, unspecified: Secondary | ICD-10-CM

## 2018-02-05 DIAGNOSIS — F329 Major depressive disorder, single episode, unspecified: Secondary | ICD-10-CM | POA: Diagnosis not present

## 2018-02-05 DIAGNOSIS — M25562 Pain in left knee: Secondary | ICD-10-CM

## 2018-02-05 MED ORDER — SERTRALINE HCL 25 MG PO TABS: 25.0000 mg | ORAL_TABLET | Freq: Every day | ORAL | 0 refills | Status: DC

## 2018-02-05 MED ORDER — BUPROPION HCL ER (XL) 150 MG PO TB24: 150.0000 mg | ORAL_TABLET | Freq: Every day | ORAL | 5 refills | Status: DC

## 2018-02-05 NOTE — Patient Instructions (Signed)
Skin Tag, Adult A skin tag (acrochordon) is a soft, extra growth of skin. Most skin tags are flesh-colored and rarely bigger than a pencil eraser. They commonly form near areas where there are folds in the skin, such as the armpit or groin. Skin tags are not dangerous, and they do not spread from person to person (are not contagious). You may have one skin tag or several. Skin tags do not require treatment. However, your health care provider may recommend removal of a skin tag if it:  Gets irritated from clothing.  Bleeds.  Is visible and unsightly.  Your health care provider can remove skin tags with a simple surgical procedure or a procedure that involves freezing the skin tag. Follow these instructions at home:  Watch for any changes in your skin tag. A normal skin tag does not require any other special care at home.  Take over-the-counter and prescription medicines only as told by your health care provider.  Keep all follow-up visits as told by your health care provider. This is important. Contact a health care provider if:  You have a skin tag that: ? Becomes painful. ? Changes color. ? Bleeds. ? Swells.  You develop more skin tags. This information is not intended to replace advice given to you by your health care provider. Make sure you discuss any questions you have with your health care provider. Document Released: 03/29/2015 Document Revised: 11/08/2015 Document Reviewed: 03/29/2015 Elsevier Interactive Patient Education  2018 [employer]. Sertraline tablets What is this medicine? SERTRALINE (SER tra leen) is used to treat depression. It may also be used to treat obsessive compulsive disorder, panic disorder, post-trauma stress, premenstrual dysphoric disorder (PMDD) or social anxiety. This medicine may be used for other purposes; ask your health care provider or pharmacist if you have questions. COMMON BRAND NAME(S): Zoloft What should I tell my health care provider  before I take this medicine? They need to know if you have any of these conditions: -bleeding disorders -bipolar disorder or a family history of bipolar disorder -glaucoma -heart disease -high blood pressure -history of irregular heartbeat -history of low levels of calcium, magnesium, or potassium in the blood -if you often drink alcohol -liver disease -receiving electroconvulsive therapy -seizures -suicidal thoughts, plans, or attempt; a previous suicide attempt by you or a family member -take medicines that treat or prevent blood clots -thyroid disease -an unusual or allergic reaction to sertraline, other medicines, foods, dyes, or preservatives -pregnant or trying to get pregnant -breast-feeding How should I use this medicine? Take this medicine by mouth with a glass of water. Follow the directions on the prescription label. You can take it with or without food. Take your medicine at regular intervals. Do not take your medicine more often than directed. Do not stop taking this medicine suddenly except upon the advice of your doctor. Stopping this medicine too quickly may cause serious side effects or your condition may worsen. A special MedGuide will be given to you by the pharmacist with each prescription and refill. Be sure to read this information carefully each time. Talk to your pediatrician regarding the use of this medicine in children. While this drug may be prescribed for children as young as 7 years for selected conditions, precautions do apply. Overdosage: If you think you have taken too much of this medicine contact a poison control center or emergency room at once. NOTE: This medicine is only for you. Do not share this medicine with others. What if I miss a  dose? If you miss a dose, take it as soon as you can. If it is almost time for your next dose, take only that dose. Do not take double or extra doses. What may interact with this medicine? Do not take this medicine with  any of the following medications: -cisapride -dofetilide -dronedarone -linezolid -MAOIs like Carbex, Eldepryl, Marplan, Nardil, and Parnate -methylene blue (injected into a vein) -pimozide -thioridazine This medicine may also interact with the following medications: -alcohol -amphetamines -aspirin and aspirin-like medicines -certain medicines for depression, anxiety, or psychotic disturbances -certain medicines for fungal infections like ketoconazole, fluconazole, posaconazole, and itraconazole -certain medicines for irregular heart beat like flecainide, quinidine, propafenone -certain medicines for migraine headaches like almotriptan, eletriptan, frovatriptan, naratriptan, rizatriptan, sumatriptan, zolmitriptan -certain medicines for sleep -certain medicines for seizures like carbamazepine, valproic acid, phenytoin -certain medicines that treat or prevent blood clots like warfarin, enoxaparin, dalteparin -cimetidine -digoxin -diuretics -fentanyl -isoniazid -lithium -NSAIDs, medicines for pain and inflammation, like ibuprofen or naproxen -other medicines that prolong the QT interval (cause an abnormal heart rhythm) -rasagiline -safinamide -supplements like St. John's wort, kava kava, valerian -tolbutamide -tramadol -tryptophan This list may not describe all possible interactions. Give your health care provider a list of all the medicines, herbs, non-prescription drugs, or dietary supplements you use. Also tell them if you smoke, drink alcohol, or use illegal drugs. Some items may interact with your medicine. What should I watch for while using this medicine? Tell your doctor if your symptoms do not get better or if they get worse. Visit your doctor or health care professional for regular checks on your progress. Because it may take several weeks to see the full effects of this medicine, it is important to continue your treatment as prescribed by your doctor. Patients and their  families should watch out for new or worsening thoughts of suicide or depression. Also watch out for sudden changes in feelings such as feeling anxious, agitated, panicky, irritable, hostile, aggressive, impulsive, severely restless, overly excited and hyperactive, or not being able to sleep. If this happens, especially at the beginning of treatment or after a change in dose, call your health care professional. Bonita Quin may get drowsy or dizzy. Do not drive, use machinery, or do anything that needs mental alertness until you know how this medicine affects you. Do not stand or sit up quickly, especially if you are an older patient. This reduces the risk of dizzy or fainting spells. Alcohol may interfere with the effect of this medicine. Avoid alcoholic drinks. Your mouth may get dry. Chewing sugarless gum or sucking hard candy, and drinking plenty of water may help. Contact your doctor if the problem does not go away or is severe. What side effects may I notice from receiving this medicine? Side effects that you should report to your doctor or health care professional as soon as possible: -allergic reactions like skin rash, itching or hives, swelling of the face, lips, or tongue -anxious -black, tarry stools -changes in vision -confusion -elevated mood, decreased need for sleep, racing thoughts, impulsive behavior -eye pain -fast, irregular heartbeat -feeling faint or lightheaded, falls -feeling agitated, angry, or irritable -hallucination, loss of contact with reality -loss of balance or coordination -loss of memory -painful or prolonged erections -restlessness, pacing, inability to keep still -seizures -stiff muscles -suicidal thoughts or other mood changes -trouble sleeping -unusual bleeding or bruising -unusually weak or tired -vomiting Side effects that usually do not require medical attention (report to your doctor or health care professional if  they continue or are bothersome): -change  in appetite or weight -change in sex drive or performance -diarrhea -increased sweating -indigestion, nausea -tremors This list may not describe all possible side effects. Call your doctor for medical advice about side effects. You may report side effects to FDA at 1-800-FDA-1088. Where should I keep my medicine? Keep out of the reach of children. Store at room temperature between 15 and 30 degrees C (59 and 86 degrees F). Throw away any unused medicine after the expiration date. NOTE: This sheet is a summary. It may not cover all possible information. If you have questions about this medicine, talk to your doctor, pharmacist, or health care provider.  2018 Elsevier/Gold Standard (2016-03-18 14:17:49)

## 2018-02-05 NOTE — Progress Notes (Signed)
[facility]   Progress Note: General Provider: Mike Gip, FNP  SUBJECTIVE:   Jade [name] is a 42 y.o. female who  has a past medical history of Abnormal Pap smear of cervix, Anxiety, and Depression.. Patient presents today for Depression (6 month follow up. Needs wellbuturin ) Patient states that she lost her home in June and is having stressors related to that. States that she is feeling overwhelmed. Denies SI, HI, AH, VH.  Patient states that she is having difficulty with losing weight. Has made dietary changes and increased water intake. Feels that she gains more when taking prozac.  Patient states that she has been on prozac since 2013. She states that she is looking for a new mental health provider. Her son sees psychiatry at [facility].  Patient with a history of left knee pain. She states that she has intermittent "knee popping out of place". She states that she is no longer taking NSAIDs. Not wearing brace.  Review of Systems  Constitutional: Negative.   HENT: Negative.   Eyes: Negative.   Respiratory: Negative.   Cardiovascular: Negative.   Gastrointestinal: Negative.   Genitourinary: Negative.   Musculoskeletal: Positive for joint pain (left knee).  Skin: Negative.   Neurological: Negative.   Psychiatric/Behavioral: Negative.      OBJECTIVE: BP 108/74 (BP Location: Left Arm, Patient Position: Sitting, Cuff Size: Large)   Pulse 78   Temp 98.1 F (36.7 C) (Oral)   Resp 14   Ht 5\' 1"  (1.549 m)   Wt 240 lb (108.9 kg)   LMP 01/20/2018   SpO2 99%   BMI 45.35 kg/m   Physical Exam  Constitutional: She is oriented to person, place, and time. She appears well-developed and well-nourished. No distress.  HENT:  Head: Normocephalic and atraumatic.  Eyes: Pupils are equal, round, and reactive to light. Conjunctivae and EOM are normal.  Neck: Normal range of motion.  Cardiovascular: Normal rate and  regular rhythm.  Pulmonary/Chest: Effort normal. No respiratory distress.  Musculoskeletal: Normal range of motion. She exhibits tenderness (left knee).  Neurological: She is alert and oriented to person, place, and time.  Skin: Skin is warm and dry.  Psychiatric: She has a normal mood and affect. Her behavior is normal. Thought content normal.  Nursing note and vitals reviewed.   ASSESSMENT/PLAN:   1. Depression, unspecified depression type Patient has not had medication in several weeks. Will change SSRI to zoloft. Recommend patient to contact [facility] for appt.  - buPROPion (WELLBUTRIN XL) 150 MG 24 hr tablet; Take 1 tablet (150 mg total) by mouth daily.  Dispense: 30 tablet; Refill: 5 - sertraline (ZOLOFT) 25 MG tablet; Take 1 tablet (25 mg total) by mouth daily. Take 1 tab po x 2 weeks then increase to 2 tabs po  Dispense: 45 tablet; Refill: 0  2. Left knee pain, unspecified chronicity Prior imaging 01/2017. Shows patella Oda Kilts. No other abnormalities. Will repeat imaging and refer for further eval.  Apply a compressive ACE bandage. Rest and elevate the affected painful area.  Apply cold compresses intermittently as needed.  As pain recedes, begin normal activities slowly as tolerated.  Call if symptoms persist. Offered patient antiinflammatory agents. Declined.  - DG Knee Complete 4 Views Left; Future - AMB referral to orthopedics    I am starting you on a new medication in the SSRI/SNRI family for your depression/anxiety. Take this medication daily as it has been prescribed. You  may experience gastrointestinal upset. This usually will stop after you are on the medication for a few days. If you have feelings of euphoria (happy and high), stop the medication immediately and go to the nearest emergency department. If you have suicidal or homicidal thoughts, delusions or hallucinations, stop the medication immediately and go to the nearest emergency department. I would  like to see you again in 2 weeks.    The patient was given clear instructions to go to ER or return to medical center if symptoms do not improve, worsen or new problems develop. The patient verbalized understanding and agreed with plan of care.   Ms. Freda Jackson. Riley Lam, FNP-BC [facility] [address] [place], [place] [address] [phone]     This note has been created with Dragon speech recognition software and smart phrase technology. Any transcriptional errors are unintentional.

## 2018-02-15 ENCOUNTER — Other Ambulatory Visit (INDEPENDENT_AMBULATORY_CARE_PROVIDER_SITE_OTHER): Payer: Self-pay

## 2018-02-15 ENCOUNTER — Ambulatory Visit (INDEPENDENT_AMBULATORY_CARE_PROVIDER_SITE_OTHER): Payer: BLUE CROSS/BLUE SHIELD

## 2018-02-15 ENCOUNTER — Ambulatory Visit (INDEPENDENT_AMBULATORY_CARE_PROVIDER_SITE_OTHER): Payer: BLUE CROSS/BLUE SHIELD | Admitting: Orthopaedic Surgery

## 2018-02-15 ENCOUNTER — Encounter (INDEPENDENT_AMBULATORY_CARE_PROVIDER_SITE_OTHER): Payer: Self-pay | Admitting: Orthopaedic Surgery

## 2018-02-15 DIAGNOSIS — M25562 Pain in left knee: Secondary | ICD-10-CM

## 2018-02-15 DIAGNOSIS — Z6841 Body Mass Index (BMI) 40.0 and over, adult: Secondary | ICD-10-CM

## 2018-02-15 DIAGNOSIS — G8929 Other chronic pain: Secondary | ICD-10-CM

## 2018-02-15 NOTE — Progress Notes (Signed)
Office Visit Note   Patient: Jade [name]           Date of Birth: 1975/06/20           MRN: [id number] Visit Date: 02/15/2018              Requested by: Mike Gip, FNP [address] [place], [place] [address] PCP: Mike Gip, FNP   Assessment & Plan: Visit Diagnoses:  1. Chronic pain of left knee   2. Body mass index 45.0-49.9, adult ([name])   3. Morbid obesity ([name])     Plan: Impression is left patellofemoral chondromalacia and maltracking.  I recommend PSO brace with physical therapy.  We discussed nonsurgical and surgical treatment of this condition today.  We also discussed the importance of weight loss.  Patient will follow-up if she does not improve from PT and bracing. The patient meets the AMA guidelines for Morbid (severe) obesity with a BMI > 40.0 and I have recommended weight loss.  Follow-Up Instructions: Return if symptoms worsen or fail to improve.   Orders:  Orders Placed This Encounter  Procedures  . XR KNEE 3 VIEW LEFT   No orders of the defined types were placed in this encounter.     Procedures: No procedures performed   Clinical Data: No additional findings.   Subjective: Chief Complaint  Patient presents with  . Left Knee - Pain    Jade [name] is a 42 year old female who comes in for 1 year of left knee pain that feels like the kneecap wants to pop out.  She denies any injuries.  She does endorse giving way.  She states that she would have a sudden pain in the soreness afterwards and it is worse with going up and down stairs and prolonged standing or sitting.  She denies any previous surgeries.  Denies any numbness and tingling.   Review of Systems  Constitutional: Negative.   HENT: Negative.   Eyes: Negative.   Respiratory: Negative.   Cardiovascular: Negative.   Endocrine: Negative.   Musculoskeletal: Negative.   Neurological: Negative.   Hematological: Negative.   Psychiatric/Behavioral: Negative.   All other systems  reviewed and are negative.    Objective: Vital Signs: LMP 01/20/2018   Physical Exam  Constitutional: She is oriented to person, place, and time. She appears well-developed and well-nourished.  HENT:  Head: Normocephalic and atraumatic.  Eyes: EOM are normal.  Neck: Neck supple.  Pulmonary/Chest: Effort normal.  Abdominal: Soft.  Neurological: She is alert and oriented to person, place, and time.  Skin: Skin is warm. Capillary refill takes less than 2 seconds.  Psychiatric: She has a normal mood and affect. Her behavior is normal. Judgment and thought content normal.  Nursing note and vitals reviewed.   Ortho Exam Left knee exam shows a trace joint effusion.  1+ patellofemoral crepitus.  Negative patellar apprehension.  Pain with patellar compression.  Normal range of motion collaterals and cruciates are stable.  No joint tenderness. Specialty Comments:  No specialty comments available.  Imaging: Xr Knee 3 View Left  Result Date: 02/15/2018 Laterally tracking patella with eversion.  Mild periarticular spurring.    PMFS History: Patient Active Problem List   Diagnosis Date Noted  . Menometrorrhagia 05/18/2017  . Morbid obesity ([name]) 06/23/2014  . High grade squamous intraepithelial cervical dysplasia 08/01/2011   Past Medical History:  Diagnosis Date  . Abnormal Pap smear of cervix   . Anxiety   . Depression  Family History  Problem Relation Age of Onset  . Hypertension Mother   . Diabetes Mother   . Hyperlipidemia Brother   . Pancreatic cancer Maternal Grandmother   . Diabetes Maternal Grandmother     Past Surgical History:  Procedure Laterality Date  . CERVICAL BIOPSY  W/ LOOP ELECTRODE EXCISION  03-2013  . CESAREAN SECTION     x 1  . COLPOSCOPY    . TUBAL LIGATION     Social History   Occupational History  . Not on file  Tobacco Use  . Smoking status: Never Smoker  . Smokeless tobacco: Never Used  Substance and Sexual Activity  . Alcohol  use: No    Frequency: Never    Comment: socially   . Drug use: No  . Sexual activity: Yes    Birth control/protection: Surgical, Pill

## 2018-03-05 ENCOUNTER — Ambulatory Visit (INDEPENDENT_AMBULATORY_CARE_PROVIDER_SITE_OTHER): Payer: BLUE CROSS/BLUE SHIELD | Admitting: Family Medicine

## 2018-03-05 ENCOUNTER — Encounter: Payer: Self-pay | Admitting: Family Medicine

## 2018-03-05 VITALS — BP 115/73 | HR 78 | Temp 98.5°F | Resp 16 | Ht 61.0 in | Wt 232.0 lb

## 2018-03-05 DIAGNOSIS — L918 Other hypertrophic disorders of the skin: Secondary | ICD-10-CM

## 2018-03-05 DIAGNOSIS — F329 Major depressive disorder, single episode, unspecified: Secondary | ICD-10-CM

## 2018-03-05 DIAGNOSIS — F32A Depression, unspecified: Secondary | ICD-10-CM

## 2018-03-05 MED ORDER — SERTRALINE HCL 50 MG PO TABS: 50.0000 mg | ORAL_TABLET | Freq: Every day | ORAL | 5 refills | Status: DC

## 2018-03-05 NOTE — Patient Instructions (Signed)

## 2018-03-05 NOTE — Progress Notes (Signed)
Acute Office Visit  Subjective:    Patient ID: Jade [name], female    DOB: [birth year], 42 y.o.   MRN: [id number]  Chief Complaint  Patient presents with  . Skin Problem    skin tag removal on neck and under right breast     HPI Patient is in today for skin tag removal of the left side of neck and right trunk under the breast.  Also reports that she is doing well on Zoloft and would like to continue. Denies suicidal ideation, intent or plan. Denies AH/VH. Denies side effects of medication.   Past Medical History:  Diagnosis Date  . Abnormal Pap smear of cervix   . Anxiety   . Depression     Past Surgical History:  Procedure Laterality Date  . CERVICAL BIOPSY  W/ LOOP ELECTRODE EXCISION  03-2013  . CESAREAN SECTION     x 1  . COLPOSCOPY    . TUBAL LIGATION      Family History  Problem Relation Age of Onset  . Hypertension Mother   . Diabetes Mother   . Hyperlipidemia Brother   . Pancreatic cancer Maternal Grandmother   . Diabetes Maternal Grandmother     Social History   Socioeconomic History  . Marital status: Single    Spouse name: Not on file  . Number of children: Not on file  . Years of education: Not on file  . Highest education level: Not on file  Occupational History  . Not on file  Social Needs  . Financial resource strain: Not on file  . Food insecurity:    Worry: Not on file    Inability: Not on file  . Transportation needs:    Medical: Not on file    Non-medical: Not on file  Tobacco Use  . Smoking status: Never Smoker  . Smokeless tobacco: Never Used  Substance and Sexual Activity  . Alcohol use: No    Frequency: Never    Comment: socially   . Drug use: No  . Sexual activity: Yes    Birth control/protection: Surgical, Pill  Lifestyle  . Physical activity:    Days per week: Not on file    Minutes per session: Not on file  . Stress: Not on file  Relationships  . Social connections:    Talks on phone: Not on file    Gets  together: Not on file    Attends religious service: Not on file    Active member of club or organization: Not on file    Attends meetings of clubs or organizations: Not on file    Relationship status: Not on file  . Intimate partner violence:    Fear of current or ex partner: Not on file    Emotionally abused: Not on file    Physically abused: Not on file    Forced sexual activity: Not on file  Other Topics Concern  . Not on file  Social History Narrative   Marital status: single; dating seriously x 7 years.      Children:  2 children (14, 6)      Lives: with two sons      Employment:  Conservation officer, nature at [employer]; first shift for 8 years.      Tobacco: none      Alcohol:  4-6 drinks per month      Drugs: none      Exercise:  Sporadic; twice weekly; walking      Seatbelt: 100%  Guns: none      Sexual activity:  Total partners = 6; history of Chlamydia in 2002; last STD screening 2016; males only    Outpatient Medications Prior to Visit  Medication Sig Dispense Refill  . buPROPion (WELLBUTRIN XL) 150 MG 24 hr tablet Take 1 tablet (150 mg total) by mouth daily. 30 tablet 5  . norgestimate-ethinyl estradiol (ORTHO-CYCLEN,SPRINTEC,PREVIFEM) 0.25-35 MG-MCG tablet Take 1 tablet by mouth daily. 2 Package 11  . sertraline (ZOLOFT) 25 MG tablet Take 1 tablet (25 mg total) by mouth daily. Take 1 tab po x 2 weeks then increase to 2 tabs po 45 tablet 0   No facility-administered medications prior to visit.     No Known Allergies  Review of Systems  Constitutional: Negative.   HENT: Negative.   Eyes: Negative.   Respiratory: Negative.   Cardiovascular: Negative.   Gastrointestinal: Negative.   Genitourinary: Negative.   Musculoskeletal: Negative.   Skin: Negative.   Neurological: Negative.   Psychiatric/Behavioral: Negative.        Objective:    Physical Exam  Constitutional: She is oriented to person, place, and time. She appears well-developed and well-nourished. No distress.    HENT:  Head: Normocephalic and atraumatic.  Eyes: Pupils are equal, round, and reactive to light. Conjunctivae and EOM are normal.  Neck: Normal range of motion.  Cardiovascular: Normal rate, regular rhythm and normal heart sounds.  Pulmonary/Chest: Effort normal and breath sounds normal. No respiratory distress.  Musculoskeletal: Normal range of motion.  Neurological: She is alert and oriented to person, place, and time.  Skin: Skin is warm and dry. Lesion (2 skin tag to the left side of neck and 1 to the trunk on the right side. ) noted.     Psychiatric: She has a normal mood and affect. Her behavior is normal. Judgment and thought content normal.  Nursing note and vitals reviewed.   BP 115/73 (BP Location: Right Arm, Patient Position: Sitting, Cuff Size: Normal)   Pulse 78   Temp 98.5 F (36.9 C) (Oral)   Resp 16   Ht 5\' 1"  (1.549 m)   Wt 232 lb (105.2 kg)   LMP 02/16/2018   SpO2 98%   BMI 43.84 kg/m  Wt Readings from Last 3 Encounters:  03/05/18 232 lb (105.2 kg)  02/05/18 240 lb (108.9 kg)  06/12/17 234 lb 12.8 oz (106.5 kg)    There are no preventive care reminders to display for this patient.  There are no preventive care reminders to display for this patient.   Lab Results  Component Value Date   TSH 0.34 (L) 03/15/2016   Lab Results  Component Value Date   WBC 6.8 05/03/2017   HGB 13.8 05/03/2017   HCT 42.0 05/03/2017   MCV 92 05/03/2017   PLT 305 05/03/2017   Lab Results  Component Value Date   NA 140 03/15/2016   K 4.0 03/15/2016   CO2 23 03/15/2016   GLUCOSE 89 03/15/2016   BUN 14 03/15/2016   CREATININE 0.79 03/15/2016   BILITOT 0.3 03/15/2016   ALKPHOS 52 03/15/2016   AST 17 03/15/2016   ALT 17 03/15/2016   PROT 6.3 03/15/2016   ALBUMIN 3.8 03/15/2016   CALCIUM 8.7 03/15/2016   Lab Results  Component Value Date   CHOL 161 03/15/2016   Lab Results  Component Value Date   HDL 36 (L) 03/15/2016   Lab Results  Component Value Date    LDLCALC 99 03/15/2016   Lab Results  Component Value Date   TRIG 131 03/15/2016   Lab Results  Component Value Date   CHOLHDL 4.5 03/15/2016   Lab Results  Component Value Date   HGBA1C 5.6 10/07/2016       Assessment & Plan:   Problem List Items Addressed This Visit    None    Visit Diagnoses    Skin tag    -  Primary   Depression, unspecified depression type       Relevant Medications   sertraline (ZOLOFT) 50 MG tablet       Meds ordered this encounter  Medications  . sertraline (ZOLOFT) 50 MG tablet    Sig: Take 1 tablet (50 mg total) by mouth daily.    Dispense:  30 tablet    Refill:  5     After obtaining written consent, patient placed in the supine position. Cleansed the left side of the neck and right trunk under the breast with chlorhexidine.  1% lidocaine injected into the base of skin tags. Skin tags were removed using sterile iris scissors. Pressure applied to the area. Minimal blood loss. Covered with triple antibiotic ointment and pressure bandage.   Mike Gip, FNP

## 2018-03-08 ENCOUNTER — Ambulatory Visit: Payer: BLUE CROSS/BLUE SHIELD | Admitting: Physical Therapy

## 2018-03-22 ENCOUNTER — Ambulatory Visit: Payer: BLUE CROSS/BLUE SHIELD | Admitting: Physical Therapy

## 2018-03-29 ENCOUNTER — Ambulatory Visit: Payer: BLUE CROSS/BLUE SHIELD | Attending: Orthopaedic Surgery | Admitting: Physical Therapy

## 2018-06-04 ENCOUNTER — Ambulatory Visit: Payer: BLUE CROSS/BLUE SHIELD | Admitting: Family Medicine

## 2018-07-03 ENCOUNTER — Other Ambulatory Visit: Payer: Self-pay | Admitting: Family Medicine

## 2018-07-03 DIAGNOSIS — F32A Depression, unspecified: Secondary | ICD-10-CM

## 2018-07-03 DIAGNOSIS — F329 Major depressive disorder, single episode, unspecified: Secondary | ICD-10-CM

## 2018-07-22 ENCOUNTER — Encounter: Payer: Self-pay | Admitting: Family Medicine

## 2018-07-22 NOTE — Progress Notes (Signed)
Patient called the on-call provider line today to report that a coworker tested positive for COVID-19. She was seen by a provider that was provided by her job on [employer]. Provider gave work excuse and instructed to quarantine for 14 days. Patient states that she gave the note to her supervisor who is now requiring her to have testing in order to work from home. Patient advised that our clinic is not currently doing testing in the outpat ient clinics. The recommendation is to quarantine and monitor symptoms. Patient states that she has headaches, nausea and fatigue but she also started her menstrual cycle and contributed her symptoms to that. She denies fevers, chills,night sweats, loss of taste and smell. Advised patient to continue with quarantine, hand washing, and monitoring symptoms. If SOB, fevers or difficulty with breathing occurs go immediately to the ED. Pa tient verbalized understanding and agreed with plan of care.

## 2018-07-23 ENCOUNTER — Telehealth: Payer: Self-pay

## 2018-07-23 NOTE — Telephone Encounter (Signed)
Called, no answer. Left a message that she can go to ER to be tested for COVID-19 if she is experiencing symptoms. Asked if any other questions to call back to our office.  Thanks !

## 2018-09-14 ENCOUNTER — Other Ambulatory Visit: Payer: Self-pay | Admitting: *Deleted

## 2018-09-14 DIAGNOSIS — Z20822 Contact with and (suspected) exposure to covid-19: Secondary | ICD-10-CM

## 2018-09-17 LAB — NOVEL CORONAVIRUS, NAA: SARS-CoV-2, NAA: NOT DETECTED

## 2018-09-24 DIAGNOSIS — Z20828 Contact with and (suspected) exposure to other viral communicable diseases: Secondary | ICD-10-CM | POA: Diagnosis not present

## 2018-12-05 ENCOUNTER — Encounter (HOSPITAL_COMMUNITY): Payer: Self-pay

## 2018-12-05 ENCOUNTER — Encounter (HOSPITAL_COMMUNITY): Payer: Self-pay | Admitting: *Deleted

## 2019-03-15 ENCOUNTER — Other Ambulatory Visit: Payer: Self-pay | Admitting: Family Medicine

## 2019-03-15 DIAGNOSIS — F32A Depression, unspecified: Secondary | ICD-10-CM

## 2019-03-15 DIAGNOSIS — F329 Major depressive disorder, single episode, unspecified: Secondary | ICD-10-CM

## 2019-03-15 NOTE — Telephone Encounter (Signed)
Patient last seen Jade Maples NP. No follow up appointment. Requesting  refill Rx : sertraline (ZOLOFT) 50 MG

## 2019-03-25 ENCOUNTER — Other Ambulatory Visit: Payer: Self-pay

## 2019-03-25 ENCOUNTER — Ambulatory Visit (INDEPENDENT_AMBULATORY_CARE_PROVIDER_SITE_OTHER): Payer: BC Managed Care – PPO | Admitting: Family Medicine

## 2019-03-25 ENCOUNTER — Encounter: Payer: Self-pay | Admitting: Family Medicine

## 2019-03-25 VITALS — BP 117/68 | HR 83 | Temp 98.1°F | Ht 61.0 in | Wt 242.0 lb

## 2019-03-25 DIAGNOSIS — Z0000 Encounter for general adult medical examination without abnormal findings: Secondary | ICD-10-CM

## 2019-03-25 DIAGNOSIS — E66813 Obesity, class 3: Secondary | ICD-10-CM

## 2019-03-25 DIAGNOSIS — Z6841 Body Mass Index (BMI) 40.0 and over, adult: Secondary | ICD-10-CM

## 2019-03-25 DIAGNOSIS — Z09 Encounter for follow-up examination after completed treatment for conditions other than malignant neoplasm: Secondary | ICD-10-CM

## 2019-03-25 DIAGNOSIS — K219 Gastro-esophageal reflux disease without esophagitis: Secondary | ICD-10-CM | POA: Insufficient documentation

## 2019-03-25 LAB — POCT URINALYSIS DIPSTICK
Bilirubin, UA: NEGATIVE
Glucose, UA: NEGATIVE
Ketones, UA: NEGATIVE
Leukocytes, UA: NEGATIVE
Nitrite, UA: NEGATIVE
Protein, UA: NEGATIVE
Spec Grav, UA: >=1.03 — AB (ref 1.010–1.025)
Urobilinogen, UA: 0.2 E.U./dL
pH, UA: 5.5 (ref 5.0–8.0)

## 2019-03-25 LAB — POCT GLYCOSYLATED HEMOGLOBIN (HGB A1C): Hemoglobin A1C: 6 % — AB (ref 4.0–5.6)

## 2019-03-25 LAB — GLUCOSE, POCT (MANUAL RESULT ENTRY): POC Glucose: 121 mg/dl — AB (ref 70–99)

## 2019-03-25 MED ORDER — FAMOTIDINE 20 MG PO TABS: 20.0000 mg | ORAL_TABLET | Freq: Two times a day (BID) | ORAL | 1 refills | Status: DC

## 2019-03-25 NOTE — Progress Notes (Signed)
[facility]   Established Patient Office Visit  Subjective:  Patient ID: Jade [name], female    DOB: [birth year]  Age: 43 y.o. MRN: [id number]  CC:  Chief Complaint  Patient presents with  . Follow-up    Former Jade BudAndre NP, est care  . Snoring    problem sleeping, ?acid reflux    HPI Jade [name] is a 43 year old female who presents for Sick Visit today.   Past Medical History:  Diagnosis Date  . Abnormal Pap smear of cervix   . Anxiety   . Depression    Current Status: This will be [name]'s initial office visit with me. She was previously seeing Jade GipAndre Douglas, NP for her PCP needs. Since her last office visit, she has c/o increased nocturnal acid reflux. She describes symptoms of choking upon awakening, gasping for air, a tasting stomach regurgitation, and burning throat. She is currently taking OTC medication for acid reflux medication. She last took medication on 03/23/2019, with no relief. She denies GI problems such as nausea, vomiting, diarrhea, an/d constipation. She denies fevers, chills, fatigue, recent infections, weight loss, and night sweats. She has not had any headaches, visual changes, dizziness, and falls. No chest pain, heart palpitations, cough and shortness of breath reported.  She has no reports of blood in stools, dysuria and hematuria. No depression or anxiety, and denies suicidal ideations, homicidal ideations, or auditory hallucinations.   Past Surgical History:  Procedure Laterality Date  . CERVICAL BIOPSY  W/ LOOP ELECTRODE EXCISION  03-2013  . CESAREAN SECTION     x 1  . COLPOSCOPY    . TUBAL LIGATION      Family History  Problem Relation Age of Onset  . Hypertension Mother   . Diabetes Mother   . Hyperlipidemia Brother   . Pancreatic cancer Maternal Grandmother   . Diabetes Maternal Grandmother     Social History   Socioeconomic History  . Marital status: Single    Spouse name: Not on  file  . Number of children: Not on file  . Years of education: Not on file  . Highest education level: Not on file  Occupational History  . Not on file  Tobacco Use  . Smoking status: Never Smoker  . Smokeless tobacco: Never Used  Substance and Sexual Activity  . Alcohol use: No    Comment: socially   . Drug use: No  . Sexual activity: Not Currently    Birth control/protection: Surgical  Other Topics Concern  . Not on file  Social History Narrative   Marital status: single; dating seriously x 7 years.      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      Tobacco: none      Alcohol:  4-6 drinks per month      Drugs: none      Exercise:  Sporadic; twice weekly; walking      Seatbelt: 100%      Guns: none      Sexual activity:  Total partners = 6; history of Chlamydia in 2002; last STD screening 2016; males only   Social Determinants of Health   Financial Resource Strain:   . Difficulty of Paying Living Expenses: Not on file  Food Insecurity:   . Worried About Programme researcher, broadcasting/film/videounning Out of Food  in the Last Year: Not on file  . Ran Out of Food in the Last Year: Not on file  Transportation Needs:   . Lack of Transportation (Medical): Not on file  . Lack of Transportation (Non-Medical): Not on file  Physical Activity:   . Days of Exercise per Week: Not on file  . Minutes of Exercise per Session: Not on file  Stress:   . Feeling of Stress : Not on file  Social Connections:   . Frequency of Communication with Friends and Family: Not on file  . Frequency of Social Gatherings with Friends and Family: Not on file  . Attends Religious Services: Not on file  . Active Member of Clubs or Organizations: Not on file  . Attends Archivist Meetings: Not on file  . Marital Status: Not on file  Intimate Partner Violence:   . Fear of Current or Ex-Partner: Not on file  . Emotionally Abused: Not on file  . Physically  Abused: Not on file  . Sexually Abused: Not on file    Outpatient Medications Prior to Visit  Medication Sig Dispense Refill  . sertraline (ZOLOFT) 50 MG tablet TAKE 1 TABLET BY MOUTH EVERY DAY 30 tablet 1  . buPROPion (WELLBUTRIN XL) 150 MG 24 hr tablet Take 1 tablet (150 mg total) by mouth daily. (Patient not taking: Reported on 03/25/2019) 30 tablet 5  . norgestimate-ethinyl estradiol (ORTHO-CYCLEN,SPRINTEC,PREVIFEM) 0.25-35 MG-MCG tablet Take 1 tablet by mouth daily. (Patient not taking: Reported on 03/25/2019) 2 Package 11   No facility-administered medications prior to visit.    No Known Allergies  ROS Review of Systems  Constitutional: Negative.   HENT: Negative.   Eyes: Negative.   Respiratory: Positive for cough.   Cardiovascular: Negative.   Gastrointestinal: Positive for abdominal distention.  Endocrine: Negative.   Genitourinary: Negative.   Musculoskeletal: Positive for back pain (chonic).  Skin: Negative.   Allergic/Immunologic: Negative.   Neurological: Negative.   Hematological: Negative.   Psychiatric/Behavioral: Negative.    Objective:    Physical Exam  Constitutional: She is oriented to person, place, and time. She appears well-developed and well-nourished.  HENT:  Head: Normocephalic and atraumatic.  Eyes: Conjunctivae are normal.  Cardiovascular: Normal rate, regular rhythm, normal heart sounds and intact distal pulses.  Pulmonary/Chest: Effort normal and breath sounds normal.  Abdominal: Soft. Bowel sounds are normal. She exhibits distension (obese).  Musculoskeletal:        General: Normal range of motion.     Cervical back: Normal range of motion and neck supple.  Neurological: She is alert and oriented to person, place, and time. She has normal reflexes.  Skin: Skin is warm and dry.  Psychiatric: She has a normal mood and affect. Her behavior is normal. Judgment and thought content normal.  Vitals reviewed.   BP 117/68   Pulse 83   Temp  98.1 F (36.7 C) (Oral)   Ht 5\' 1"  (1.549 m)   Wt 242 lb (109.8 kg)   SpO2 97%   BMI 45.73 kg/m  Wt Readings from Last 3 Encounters:  03/25/19 242 lb (109.8 kg)  03/05/18 232 lb (105.2 kg)  02/05/18 240 lb (108.9 kg)     There are no preventive care reminders to display for this patient.  There are no preventive care reminders to display for this patient.  Lab Results  Component Value Date   TSH 0.34 (L) 03/15/2016   Lab Results  Component Value Date   WBC 6.8 05/03/2017  HGB 13.8 05/03/2017   HCT 42.0 05/03/2017   MCV 92 05/03/2017   PLT 305 05/03/2017   Lab Results  Component Value Date   NA 140 03/15/2016   K 4.0 03/15/2016   CO2 23 03/15/2016   GLUCOSE 89 03/15/2016   BUN 14 03/15/2016   CREATININE 0.79 03/15/2016   BILITOT 0.3 03/15/2016   ALKPHOS 52 03/15/2016   AST 17 03/15/2016   ALT 17 03/15/2016   PROT 6.3 03/15/2016   ALBUMIN 3.8 03/15/2016   CALCIUM 8.7 03/15/2016   Lab Results  Component Value Date   CHOL 161 03/15/2016   Lab Results  Component Value Date   HDL 36 (L) 03/15/2016   Lab Results  Component Value Date   LDLCALC 99 03/15/2016   Lab Results  Component Value Date   TRIG 131 03/15/2016   Lab Results  Component Value Date   CHOLHDL 4.5 03/15/2016   Lab Results  Component Value Date   HGBA1C 6.0 (A) 03/25/2019      Assessment & Plan:   1. Gastroesophageal reflux disease without esophagitis We will initiate Famotidine today.  - famotidine (PEPCID) 20 MG tablet; Take 1 tablet (20 mg total) by mouth 2 (two) times daily.  Dispense: 60 tablet; Refill: 1  2. Class 3 severe obesity due to excess calories without serious comorbidity with body mass index (BMI) of 45.0 to 49.9 in adult [facility]) Body mass index is 45.73 kg/m. Goal BMI  is <30. Encouraged efforts to reduce weight include engaging in physical activity as tolerated with goal of 150 minutes per week. Improve dietary choices and eat a meal regimen consistent with a  Mediterranean or DASH diet. Reduce simple carbohydrates. Do not skip meals and eat healthy snacks throughout the day to avoid over-eating at dinner. Set a goal weight loss that is achievable for you.  3. Morbid obesity ([name])  4. Healthcare maintenance - Urinalysis Dipstick - POCT HgB A1C - Glucose (CBG)  5. Follow up She will follow up in 2 months for assessment of GERD and fasting Lab draw.   Meds ordered this encounter  Medications  . famotidine (PEPCID) 20 MG tablet    Sig: Take 1 tablet (20 mg total) by mouth 2 (two) times daily.    Dispense:  60 tablet    Refill:  1    Orders Placed This Encounter  Procedures  . Urinalysis Dipstick  . POCT HgB A1C  . Glucose (CBG)    Referral Orders  No referral(s) requested today    Raliegh Ip,  MSN, FNP-BC [facility] [address] [place], [place] [address] [phone]  Problem List Items Addressed This Visit      Other   Morbid obesity ([name])    Other Visit Diagnoses    Gastroesophageal reflux disease without esophagitis    -  Primary   Relevant Medications   famotidine (PEPCID) 20 MG tablet   Class 3 severe obesity due to excess calories without serious comorbidity with body mass index (BMI) of 45.0 to 49.9 in adult [facility]       Healthcare maintenance       Relevant Orders   Urinalysis Dipstick (Completed)   POCT HgB A1C (Completed)   Glucose (CBG) (Completed)   Follow up          Meds ordered this encounter  Medications  . famotidine (PEPCID) 20 MG tablet    Sig: Take 1 tablet (  20 mg total) by mouth 2 (two) times daily.    Dispense:  60 tablet    Refill:  1    Follow-up: Return in about 2 months (around 05/26/2019).    Kallie Locks, FNP

## 2019-03-25 NOTE — Patient Instructions (Addendum)
DASH Eating Plan DASH stands for "Dietary Approaches to Stop Hypertension." The DASH eating plan is a healthy eating plan that has been shown to reduce high blood pressure (hypertension). It may also reduce your risk for type 2 diabetes, heart disease, and stroke. The DASH eating plan may also help with weight loss. What are tips for following this plan?  General guidelines  Avoid eating more than 2,300 mg (milligrams) of salt (sodium) a day. If you have hypertension, you may need to reduce your sodium intake to 1,500 mg a day.  Limit alcohol intake to no more than 1 drink a day for nonpregnant women and 2 drinks a day for men. One drink equals 12 oz of beer, 5 oz of wine, or 1 oz of hard liquor.  Work with your health care provider to maintain a healthy body weight or to lose weight. Ask what an ideal weight is for you.  Get at least 30 minutes of exercise that causes your heart to beat faster (aerobic exercise) most days of the week. Activities may include walking, swimming, or biking.  Work with your health care provider or diet and nutrition specialist (dietitian) to adjust your eating plan to your individual calorie needs. Reading food labels   Check food labels for the amount of sodium per serving. Choose foods with less than 5 percent of the Daily Value of sodium. Generally, foods with less than 300 mg of sodium per serving fit into this eating plan.  To find whole grains, look for the word "whole" as the first word in the ingredient list. Shopping  Buy products labeled as "low-sodium" or "no salt added."  Buy fresh foods. Avoid canned foods and premade or frozen meals. Cooking  Avoid adding salt when cooking. Use salt-free seasonings or herbs instead of table salt or sea salt. Check with your health care provider or pharmacist before using salt substitutes.  Do not fry foods. Cook foods using healthy methods such as baking, boiling, grilling, and broiling instead.  Cook with  heart-healthy oils, such as olive, canola, soybean, or sunflower oil. Meal planning  Eat a balanced diet that includes: ? 5 or more servings of fruits and vegetables each day. At each meal, try to fill half of your plate with fruits and vegetables. ? Up to 6-8 servings of whole grains each day. ? Less than 6 oz of lean meat, poultry, or fish each day. A 3-oz serving of meat is about the same size as a deck of cards. One egg equals 1 oz. ? 2 servings of low-fat dairy each day. ? A serving of nuts, seeds, or beans 5 times each week. ? Heart-healthy fats. Healthy fats called Omega-3 fatty acids are found in foods such as flaxseeds and coldwater fish, like sardines, salmon, and mackerel.  Limit how much you eat of the following: ? Canned or prepackaged foods. ? Food that is high in trans fat, such as fried foods. ? Food that is high in saturated fat, such as fatty meat. ? Sweets, desserts, sugary drinks, and other foods with added sugar. ? Full-fat dairy products.  Do not salt foods before eating.  Try to eat at least 2 vegetarian meals each week.  Eat more home-cooked food and less restaurant, buffet, and fast food.  When eating at a restaurant, ask that your food be prepared with less salt or no salt, if possible. What foods are recommended? The items listed may not be a complete list. Talk with your dietitian about   what dietary choices are best for you. Grains Whole-grain or whole-wheat bread. Whole-grain or whole-wheat pasta. Brown rice. Orpah Cobb. Bulgur. Whole-grain and low-sodium cereals. Pita bread. Low-fat, low-sodium crackers. Whole-wheat flour tortillas. Vegetables Fresh or frozen vegetables (raw, steamed, roasted, or grilled). Low-sodium or reduced-sodium tomato and vegetable juice. Low-sodium or reduced-sodium tomato sauce and tomato paste. Low-sodium or reduced-sodium canned vegetables. Fruits All fresh, dried, or frozen fruit. Canned fruit in natural juice (without  added sugar). Meat and other protein foods Skinless chicken or [place]. Ground chicken or [place]. Pork with fat trimmed off. Fish and seafood. Egg whites. Dried beans, peas, or lentils. Unsalted nuts, nut butters, and seeds. Unsalted canned beans. Lean cuts of beef with fat trimmed off. Low-sodium, lean deli meat. Dairy Low-fat (1%) or fat-free (skim) milk. Fat-free, low-fat, or reduced-fat cheeses. Nonfat, low-sodium ricotta or cottage cheese. Low-fat or nonfat yogurt. Low-fat, low-sodium cheese. Fats and oils Soft margarine without trans fats. Vegetable oil. Low-fat, reduced-fat, or light mayonnaise and salad dressings (reduced-sodium). Canola, safflower, olive, soybean, and sunflower oils. Avocado. Seasoning and other foods Herbs. Spices. Seasoning mixes without salt. Unsalted popcorn and pretzels. Fat-free sweets. What foods are not recommended? The items listed may not be a complete list. Talk with your dietitian about what dietary choices are best for you. Grains Baked goods made with fat, such as croissants, muffins, or some breads. Dry pasta or rice meal packs. Vegetables Creamed or fried vegetables. Vegetables in a cheese sauce. Regular canned vegetables (not low-sodium or reduced-sodium). Regular canned tomato sauce and paste (not low-sodium or reduced-sodium). Regular tomato and vegetable juice (not low-sodium or reduced-sodium). Rosita Fire. Olives. Fruits Canned fruit in a light or heavy syrup. Fried fruit. Fruit in cream or butter sauce.  Famotidine tablets or gelcaps What is this medicine? FAMOTIDINE (fa MOE ti deen) is a type of antihistamine that blocks the release of stomach acid. It is used to treat stomach or intestinal ulcers. It can also relieve heartburn from acid reflux. This medicine may be used for other purposes; ask your health care provider or pharmacist if you have questions. COMMON BRAND NAME(S): Heartburn Relief, Pepcid, Pepcid AC, Pepcid AC Maximum Strength What  should I tell my health care provider before I take this medicine? They need to know if you have any of these conditions:  kidney or liver disease  trouble swallowing  an unusual or allergic reaction to famotidine, other medicines, foods, dyes, or preservatives  pregnant or trying to get pregnant  breast-feeding How should I use this medicine? Take this medicine by mouth with a glass of water. Follow the directions on the prescription label. If you only take this medicine once a day, take it at bedtime. Take your doses at regular intervals. Do not take your medicine more often than directed. Talk to your pediatrician regarding the use of this medicine in children. Special care may be needed. Overdosage: If you think you have taken too much of this medicine contact a poison control center or emergency room at once. NOTE: This medicine is only for you. Do not share this medicine with others. What if I miss a dose? If you miss a dose, take it as soon as you can. If it is almost time for your next dose, take only that dose. Do not take double or extra doses. What may interact with this medicine?  delavirdine  itraconazole  ketoconazole This list may not describe all possible interactions. Give your health care provider a list of all the medicines, herbs,  non-prescription drugs, or dietary supplements you use. Also tell them if you smoke, drink alcohol, or use illegal drugs. Some items may interact with your medicine. What should I watch for while using this medicine? Tell your doctor or health care professional if your condition does not start to get better or if it gets worse. Finish the full course of tablets prescribed, even if you feel better. Do not take with aspirin, ibuprofen or other antiinflammatory medicines. These can make your condition worse. Do not smoke cigarettes or drink alcohol. These cause irritation in your stomach and can increase the time it will take for ulcers to  heal. If you get black, tarry stools or vomit up what looks like coffee grounds, call your doctor or health care professional at once. You may have a bleeding ulcer. This medicine may cause a decrease in vitamin B12. You should make sure that you get enough vitamin B12 while you are taking this medicine. Discuss the foods you eat and the vitamins you take with your health care professional. What side effects may I notice from receiving this medicine? Side effects that you should report to your doctor or health care professional as soon as possible:  agitation, nervousness  confusion  hallucinations  skin rash, itching Side effects that usually do not require medical attention (report to your doctor or health care professional if they continue or are bothersome):  constipation  diarrhea  dizziness  headache This list may not describe all possible side effects. Call your doctor for medical advice about side effects. You may report side effects to FDA at 1-800-FDA-1088. Where should I keep my medicine? Keep out of the reach of children. Store at room temperature between 15 and 30 degrees C (59 and 86 degrees F). Do not freeze. Throw away any unused medicine after the expiration date. NOTE: This sheet is a summary. It may not cover all possible information. If you have questions about this medicine, talk to your doctor, pharmacist, or health care provider.  2020 Elsevier/Gold Standard (2016-10-28 13:17:50)  Gastroesophageal Reflux Disease, Adult Gastroesophageal reflux (GER) happens when acid from the stomach flows up into the tube that connects the mouth and the stomach (esophagus). Normally, food travels down the esophagus and stays in the stomach to be digested. With GER, food and stomach acid sometimes move back up into the esophagus. You may have a disease called gastroesophageal reflux disease (GERD) if the reflux:  Happens often.  Causes frequent or very bad symptoms.  Causes  problems such as damage to the esophagus. When this happens, the esophagus becomes sore and swollen (inflamed). Over time, GERD can make small holes (ulcers) in the lining of the esophagus. What are the causes? This condition is caused by a problem with the muscle between the esophagus and the stomach. When this muscle is weak or not normal, it does not close properly to keep food and acid from coming back up from the stomach. The muscle can be weak because of:  Tobacco use.  Pregnancy.  Having a certain type of hernia (hiatal hernia).  Alcohol use.  Certain foods and drinks, such as coffee, chocolate, onions, and peppermint. What increases the risk? You are more likely to develop this condition if you:  Are overweight.  Have a disease that affects your connective tissue.  Use NSAID medicines. What are the signs or symptoms? Symptoms of this condition include:  Heartburn.  Difficult or painful swallowing.  The feeling of having a lump in the throat.  A bitter taste in the mouth.  Bad breath.  Having a lot of saliva.  Having an upset or bloated stomach.  Belching.  Chest pain. Different conditions can cause chest pain. Make sure you see your doctor if you have chest pain.  Shortness of breath or noisy breathing (wheezing).  Ongoing (chronic) cough or a cough at night.  Wearing away of the surface of teeth (tooth enamel).  Weight loss. How is this treated? Treatment will depend on how bad your symptoms are. Your doctor may suggest:  Changes to your diet.  Medicine.  Surgery. Follow these instructions at home: Eating and drinking   Follow a diet as told by your doctor. You may need to avoid foods and drinks such as: ? Coffee and tea (with or without caffeine). ? Drinks that contain alcohol. ? Energy drinks and sports drinks. ? Bubbly (carbonated) drinks or sodas. ? Chocolate and cocoa. ? Peppermint and mint flavorings. ? Garlic and  onions. ? Horseradish. ? Spicy and acidic foods. These include peppers, chili powder, curry powder, vinegar, hot sauces, and BBQ sauce. ? Citrus fruit juices and citrus fruits, such as oranges, lemons, and limes. ? Tomato-based foods. These include red sauce, chili, salsa, and pizza with red sauce. ? Fried and fatty foods. These include donuts, french fries, potato chips, and high-fat dressings. ? High-fat meats. These include hot dogs, rib eye steak, sausage, ham, and bacon. ? High-fat dairy items, such as whole milk, butter, and cream cheese.  Eat small meals often. Avoid eating large meals.  Avoid drinking large amounts of liquid with your meals.  Avoid eating meals during the 2-3 hours before bedtime.  Avoid lying down right after you eat.  Do not exercise right after you eat. Lifestyle   Do not use any products that contain nicotine or tobacco. These include cigarettes, e-cigarettes, and chewing tobacco. If you need help quitting, ask your doctor.  Try to lower your stress. If you need help doing this, ask your doctor.  If you are overweight, lose an amount of weight that is healthy for you. Ask your doctor about a safe weight loss goal. General instructions  Pay attention to any changes in your symptoms.  Take over-the-counter and prescription medicines only as told by your doctor. Do not take aspirin, ibuprofen, or other NSAIDs unless your doctor says it is okay.  Wear loose clothes. Do not wear anything tight around your waist.  Raise (elevate) the head of your bed about 6 inches (15 cm).  Avoid bending over if this makes your symptoms worse.  Keep all follow-up visits as told by your doctor. This is important. Contact a doctor if:  You have new symptoms.  You lose weight and you do not know why.  You have trouble swallowing or it hurts to swallow.  You have wheezing or a cough that keeps happening.  Your symptoms do not get better with treatment.  You have  a hoarse voice. Get help right away if:  You have pain in your arms, neck, jaw, teeth, or back.  You feel sweaty, dizzy, or light-headed.  You have chest pain or shortness of breath.  You throw up (vomit) and your throw-up looks like blood or coffee grounds.  You pass out (faint).  Your poop (stool) is bloody or black.  You cannot swallow, drink, or eat. Summary  If a person has gastroesophageal reflux disease (GERD), food and stomach acid move back up into the esophagus and cause symptoms or problems  such as damage to the esophagus.  Treatment will depend on how bad your symptoms are.  Follow a diet as told by your doctor.  Take all medicines only as told by your doctor. This information is not intended to replace advice given to you by your health care provider. Make sure you discuss any questions you have with your health care provider. Document Released: 08/31/2007 Document Revised: 09/20/2017 Document Reviewed: 09/20/2017 Elsevier Patient Education  2020 [place] and other protein foods Fatty cuts of meat. Ribs. Fried meat. Berniece Salines. Sausage. Bologna and other processed lunch meats. Salami. Fatback. Hotdogs. Bratwurst. Salted nuts and seeds. Canned beans with added salt. Canned or smoked fish. Whole eggs or egg yolks. Chicken or [place] with skin. Dairy Whole or 2% milk, cream, and half-and-half. Whole or full-fat cream cheese. Whole-fat or sweetened yogurt. Full-fat cheese. Nondairy creamers. Whipped toppings. Processed cheese and cheese spreads. Fats and oils Butter. Stick margarine. Lard. Shortening. Ghee. Bacon fat. Tropical oils, such as coconut, palm kernel, or palm oil. Seasoning and other foods Salted popcorn and pretzels. Onion salt, garlic salt, seasoned salt, table salt, and sea salt. Worcestershire sauce. Tartar sauce. Barbecue sauce. Teriyaki sauce. Soy sauce, including reduced-sodium. Steak sauce. Canned and packaged gravies. Fish sauce. Oyster sauce.  Cocktail sauce. Horseradish that you find on the shelf. Ketchup. Mustard. Meat flavorings and tenderizers. Bouillon cubes. Hot sauce and Tabasco sauce. Premade or packaged marinades. Premade or packaged taco seasonings. Relishes. Regular salad dressings. Where to find more information:  National Heart, Lung, and [place]: https://wilson-eaton.com/  American Heart Association: www.heart.org Summary  The DASH eating plan is a healthy eating plan that has been shown to reduce high blood pressure (hypertension). It may also reduce your risk for type 2 diabetes, heart disease, and stroke.  With the DASH eating plan, you should limit salt (sodium) intake to 2,300 mg a day. If you have hypertension, you may need to reduce your sodium intake to 1,500 mg a day.  When on the DASH eating plan, aim to eat more fresh fruits and vegetables, whole grains, lean proteins, low-fat dairy, and heart-healthy fats.  Work with your health care provider or diet and nutrition specialist (dietitian) to adjust your eating plan to your individual calorie needs. This information is not intended to replace advice given to you by your health care provider. Make sure you discuss any questions you have with your health care provider. Document Released: 03/03/2011 Document Revised: 02/24/2017 Document Reviewed: 03/07/2016 Elsevier Patient Education  2020 [employer].

## 2019-04-01 ENCOUNTER — Other Ambulatory Visit: Payer: Self-pay

## 2019-04-01 DIAGNOSIS — F32A Depression, unspecified: Secondary | ICD-10-CM

## 2019-04-01 DIAGNOSIS — F329 Major depressive disorder, single episode, unspecified: Secondary | ICD-10-CM

## 2019-04-01 MED ORDER — SERTRALINE HCL 50 MG PO TABS: 50.0000 mg | ORAL_TABLET | Freq: Every day | ORAL | 1 refills | Status: DC

## 2019-04-23 ENCOUNTER — Other Ambulatory Visit: Payer: Self-pay | Admitting: Family Medicine

## 2019-04-23 DIAGNOSIS — K219 Gastro-esophageal reflux disease without esophagitis: Secondary | ICD-10-CM

## 2019-04-28 ENCOUNTER — Other Ambulatory Visit: Payer: Self-pay | Admitting: Family Medicine

## 2019-04-28 DIAGNOSIS — F329 Major depressive disorder, single episode, unspecified: Secondary | ICD-10-CM

## 2019-04-28 DIAGNOSIS — F32A Depression, unspecified: Secondary | ICD-10-CM

## 2019-04-29 DIAGNOSIS — E559 Vitamin D deficiency, unspecified: Secondary | ICD-10-CM

## 2019-04-29 HISTORY — DX: Vitamin D deficiency, unspecified: E55.9

## 2019-05-21 ENCOUNTER — Other Ambulatory Visit: Payer: Self-pay | Admitting: Family Medicine

## 2019-05-21 DIAGNOSIS — K219 Gastro-esophageal reflux disease without esophagitis: Secondary | ICD-10-CM

## 2019-05-23 ENCOUNTER — Telehealth: Payer: Self-pay | Admitting: Family Medicine

## 2019-05-23 NOTE — Telephone Encounter (Signed)
Pt was called and reminded of there appointment 

## 2019-05-24 ENCOUNTER — Other Ambulatory Visit: Payer: Self-pay

## 2019-05-24 ENCOUNTER — Encounter: Payer: Self-pay | Admitting: Family Medicine

## 2019-05-24 ENCOUNTER — Ambulatory Visit (INDEPENDENT_AMBULATORY_CARE_PROVIDER_SITE_OTHER): Payer: Medicaid Other | Admitting: Family Medicine

## 2019-05-24 VITALS — BP 106/75 | HR 77 | Temp 98.1°F | Ht 61.0 in | Wt 240.0 lb

## 2019-05-24 DIAGNOSIS — F32A Depression, unspecified: Secondary | ICD-10-CM

## 2019-05-24 DIAGNOSIS — Z0000 Encounter for general adult medical examination without abnormal findings: Secondary | ICD-10-CM | POA: Diagnosis not present

## 2019-05-24 DIAGNOSIS — E66813 Obesity, class 3: Secondary | ICD-10-CM

## 2019-05-24 DIAGNOSIS — G8929 Other chronic pain: Secondary | ICD-10-CM

## 2019-05-24 DIAGNOSIS — M25562 Pain in left knee: Secondary | ICD-10-CM | POA: Diagnosis not present

## 2019-05-24 DIAGNOSIS — Z6841 Body Mass Index (BMI) 40.0 and over, adult: Secondary | ICD-10-CM

## 2019-05-24 DIAGNOSIS — K219 Gastro-esophageal reflux disease without esophagitis: Secondary | ICD-10-CM

## 2019-05-24 DIAGNOSIS — R7303 Prediabetes: Secondary | ICD-10-CM

## 2019-05-24 DIAGNOSIS — M549 Dorsalgia, unspecified: Secondary | ICD-10-CM

## 2019-05-24 DIAGNOSIS — R7309 Other abnormal glucose: Secondary | ICD-10-CM

## 2019-05-24 DIAGNOSIS — F329 Major depressive disorder, single episode, unspecified: Secondary | ICD-10-CM

## 2019-05-24 DIAGNOSIS — Z09 Encounter for follow-up examination after completed treatment for conditions other than malignant neoplasm: Secondary | ICD-10-CM

## 2019-05-24 LAB — POCT URINALYSIS DIPSTICK
Bilirubin, UA: NEGATIVE
Blood, UA: NEGATIVE
Glucose, UA: NEGATIVE
Ketones, UA: NEGATIVE
Leukocytes, UA: NEGATIVE
Nitrite, UA: NEGATIVE
Protein, UA: NEGATIVE
Spec Grav, UA: >=1.03 — AB (ref 1.010–1.025)
Urobilinogen, UA: 0.2 E.U./dL
pH, UA: 5.5 (ref 5.0–8.0)

## 2019-05-24 NOTE — Progress Notes (Signed)
[facility]   Established Patient Office Visit  Subjective:  Patient ID: Jade [name], female    DOB: 30-Dec-1975  Age: 44 y.o. MRN: [id number]  CC:  Chief Complaint  Patient presents with  . Follow-up  . Back Pain    Mid lower back pain    HPI Jade [name] is a 44 year old female who presents for Follow Up today.  Past Medical History:  Diagnosis Date  . Abnormal Pap smear of cervix   . Anxiety   . Depression   . Vitamin D deficiency 04/2019   Current Status: This will be Jade [name]'s initial office visit with me. She was previously seeing Mike Gip, NP for her PCP needs. She has c/o chronic back pain. She states that she takes Motrin and other OTC medications as needed. Her anxiety is stable today. She denies suicidal ideations, homicidal ideations, or auditory hallucinations. She denies fevers, chills, fatigue, recent infections, weight loss, and night sweats. She has not had any headaches, visual changes, dizziness, and falls. No chest pain, heart palpitations, cough and shortness of breath reported. No reports of GI problems such as nausea, vomiting, diarrhea, and constipation. She has no reports of blood in stools, dysuria and hematuria.   Past Surgical History:  Procedure Laterality Date  . CERVICAL BIOPSY  W/ LOOP ELECTRODE EXCISION  03-2013  . CESAREAN SECTION     x 1  . COLPOSCOPY    . TUBAL LIGATION      Family History  Problem Relation Age of Onset  . Hypertension Mother   . Diabetes Mother   . Hyperlipidemia Brother   . Pancreatic cancer Maternal Grandmother   . Diabetes Maternal Grandmother     Social History   Socioeconomic History  . Marital status: Single    Spouse name: Not on file  . Number of children: Not on file  . Years of education: Not on file  . Highest education level: Not on file  Occupational History  . Not on file  Tobacco Use  . Smoking status: Never Smoker  . Smokeless  tobacco: Never Used  Substance and Sexual Activity  . Alcohol use: No    Comment: socially   . Drug use: No  . Sexual activity: Not Currently    Birth control/protection: Surgical  Other Topics Concern  . Not on file  Social History Narrative   Marital status: single; dating seriously x 7 years.      Children:  2 children (14, 6)      Lives: with two sons      Employment:  Conservation officer, nature at [employer]; first shift for 8 years.      Tobacco: none      Alcohol:  4-6 drinks per month      Drugs: none      Exercise:  Sporadic; twice weekly; walking      Seatbelt: 100%      Guns: none      Sexual activity:  Total partners = 6; history of Chlamydia in 2002; last STD screening 2016; males only   Social Determinants of Health   Financial Resource Strain:   . Difficulty of Paying Living Expenses: Not on file  Food Insecurity:   . Worried About Programme researcher, broadcasting/film/video in the Last Year: Not on file  . Ran Out of Food in the Last Year: Not on file  Transportation Needs:   . Lack of Transportation (Medical): Not on  file  . Lack of Transportation (Non-Medical): Not on file  Physical Activity:   . Days of Exercise per Week: Not on file  . Minutes of Exercise per Session: Not on file  Stress:   . Feeling of Stress : Not on file  Social Connections:   . Frequency of Communication with Friends and Family: Not on file  . Frequency of Social Gatherings with Friends and Family: Not on file  . Attends Religious Services: Not on file  . Active Member of Clubs or Organizations: Not on file  . Attends Banker Meetings: Not on file  . Marital Status: Not on file  Intimate Partner Violence:   . Fear of Current or Ex-Partner: Not on file  . Emotionally Abused: Not on file  . Physically Abused: Not on file  . Sexually Abused: Not on file    Outpatient Medications Prior to Visit  Medication Sig Dispense Refill  . famotidine (PEPCID) 20 MG tablet TAKE 1 TABLET BY MOUTH TWICE A DAY 60 tablet 1  .  sertraline (ZOLOFT) 50 MG tablet TAKE 1 TABLET BY MOUTH EVERY DAY 90 tablet 1  . buPROPion (WELLBUTRIN XL) 150 MG 24 hr tablet Take 1 tablet (150 mg total) by mouth daily. (Patient not taking: Reported on 03/25/2019) 30 tablet 5  . FLUoxetine (PROZAC) 20 MG capsule fluoxetine 20 mg capsule  Take 1 capsule every day by oral route.    . norgestimate-ethinyl estradiol (ORTHO-CYCLEN,SPRINTEC,PREVIFEM) 0.25-35 MG-MCG tablet Take 1 tablet by mouth daily. (Patient not taking: Reported on 03/25/2019) 2 Package 11   No facility-administered medications prior to visit.    No Known Allergies  ROS Review of Systems  Constitutional: Negative.   HENT: Negative.   Eyes: Negative.   Respiratory: Negative.   Cardiovascular: Negative.   Gastrointestinal: Negative.   Endocrine: Negative.   Genitourinary: Negative.   Musculoskeletal: Positive for back pain (chronic).  Skin: Negative.   Allergic/Immunologic: Negative.   Neurological: Negative.   Hematological: Negative.   Psychiatric/Behavioral: Negative.    Objective:    Physical Exam  Constitutional: She is oriented to person, place, and time. She appears well-developed and well-nourished.  HENT:  Head: Normocephalic and atraumatic.  Eyes: Conjunctivae are normal.  Cardiovascular: Normal rate, regular rhythm, normal heart sounds and intact distal pulses.  Pulmonary/Chest: Effort normal and breath sounds normal.  Abdominal: Soft. Bowel sounds are normal. She exhibits distension (obese).  Musculoskeletal:        General: Normal range of motion.     Cervical back: Normal range of motion and neck supple.  Neurological: She is alert and oriented to person, place, and time. She has normal reflexes.  Skin: Skin is warm and dry.  Psychiatric: She has a normal mood and affect. Her behavior is normal. Judgment and thought content normal.  Nursing note and vitals reviewed.   BP 106/75   Pulse 77   Temp 98.1 F (36.7 C) (Oral)   Ht 5\' 1"  (1.549  m)   Wt 240 lb (108.9 kg)   LMP 05/11/2019   SpO2 99%   BMI 45.35 kg/m  Wt Readings from Last 3 Encounters:  05/24/19 240 lb (108.9 kg)  03/25/19 242 lb (109.8 kg)  03/05/18 232 lb (105.2 kg)     There are no preventive care reminders to display for this patient.  There are no preventive care reminders to display for this patient.  Lab Results  Component Value Date   TSH 0.572 05/24/2019   Lab Results  Component Value Date   WBC 5.4 05/24/2019   HGB 13.5 05/24/2019   HCT 40.8 05/24/2019   MCV 88 05/24/2019   PLT 300 05/24/2019   Lab Results  Component Value Date   NA 140 05/24/2019   K 4.4 05/24/2019   CO2 23 05/24/2019   GLUCOSE 84 05/24/2019   BUN 14 05/24/2019   CREATININE 0.76 05/24/2019   BILITOT <0.2 05/24/2019   ALKPHOS 87 05/24/2019   AST 16 05/24/2019   ALT 18 05/24/2019   PROT 6.6 05/24/2019   ALBUMIN 3.7 (L) 05/24/2019   CALCIUM 8.8 05/24/2019   Lab Results  Component Value Date   CHOL 177 05/24/2019   Lab Results  Component Value Date   HDL 50 05/24/2019   Lab Results  Component Value Date   LDLCALC 113 (H) 05/24/2019   Lab Results  Component Value Date   TRIG 74 05/24/2019   Lab Results  Component Value Date   CHOLHDL 3.5 05/24/2019   Lab Results  Component Value Date   HGBA1C 6.0 (A) 03/25/2019      Assessment & Plan:   1. Chronic back pain, unspecified back location, unspecified back pain laterality - DG Lumbar Spine Complete; Future - DG Thoracic Spine 2 View; Future  2. Left knee pain, unspecified chronicit - DG Lumbar Spine Complete; Future - DG Thoracic Spine 2 View; Future  3. Gastroesophageal reflux disease without esophagitis  4. Class 3 severe obesity due to excess calories without serious comorbidity with body mass index (BMI) of 45.0 to 49.9 in adult [facility]) Body mass index is 45.35 kg/m. Goal BMI  is <30. Encouraged efforts to reduce weight include engaging in physical activity as tolerated with goal of 150  minutes per week. Improve dietary choices and eat a meal regimen consistent with a Mediterranean or DASH diet. Reduce simple carbohydrates. Do not skip meals and eat healthy snacks throughout the day to avoid over-eating at dinner. Set a goal weight loss that is achievable for you.  5. Depression, unspecified depression type Stable.   6. Prediabetes  7. Hemoglobin A1c less than 7.0% Hgb A1c stable at 6.0 today.   8. Healthcare maintenance - CBC with Differential - Comprehensive metabolic panel - Lipid Panel - TSH - Vitamin B12 - Vitamin D, 25-hydroxy - POCT urinalysis dipstick  9. Follow up She will follow up in 6 months with Dionisio David, NP.   No orders of the defined types were placed in this encounter.   Orders Placed This Encounter  Procedures  . DG Lumbar Spine Complete  . DG Thoracic Spine 2 View  . CBC with Differential  . Comprehensive metabolic panel  . Lipid Panel  . TSH  . Vitamin B12  . Vitamin D, 25-hydroxy  . POCT urinalysis dipstick    Referral Orders  No referral(s) requested today    Kathe Becton,  MSN, FNP-BC [place] [address] [place], [place] [address] [phone] fax   Problem List Items Addressed This Visit      Digestive   Gastroesophageal reflux disease without esophagitis    Other Visit Diagnoses    Chronic back pain, unspecified back location, unspecified back pain laterality    -  Primary   Relevant Orders   DG Lumbar Spine Complete   DG Thoracic Spine 2 View   Left knee pain, unspecified chronicity       Relevant Orders   DG Lumbar Spine Complete  DG Thoracic Spine 2 View   Class 3 severe obesity due to excess calories without serious comorbidity with body mass index (BMI) of 45.0 to 49.9 in adult [facility])       Depression, unspecified depression type       Prediabetes       Hemoglobin A1c less than 7.0%       Healthcare  maintenance       Relevant Orders   CBC with Differential (Completed)   Comprehensive metabolic panel (Completed)   Lipid Panel (Completed)   TSH (Completed)   Vitamin B12 (Completed)   Vitamin D, 25-hydroxy (Completed)   POCT urinalysis dipstick (Completed)   Follow up          No orders of the defined types were placed in this encounter.   Follow-up: No follow-ups on file.    Kallie Locks, FNP

## 2019-05-25 LAB — COMPREHENSIVE METABOLIC PANEL
ALT: 18 IU/L (ref 0–32)
AST: 16 IU/L (ref 0–40)
Albumin/Globulin Ratio: 1.3 (ref 1.2–2.2)
Albumin: 3.7 g/dL — ABNORMAL LOW (ref 3.8–4.8)
Alkaline Phosphatase: 87 IU/L (ref 39–117)
BUN/Creatinine Ratio: 18 (ref 9–23)
BUN: 14 mg/dL (ref 6–24)
Bilirubin Total: <0.2 mg/dL (ref 0.0–1.2)
CO2: 23 mmol/L (ref 20–29)
Calcium: 8.8 mg/dL (ref 8.7–10.2)
Chloride: 103 mmol/L (ref 96–106)
Creatinine, Ser: 0.76 mg/dL (ref 0.57–1.00)
GFR calc Af Amer: 113 mL/min/{1.73_m2} (ref >59)
GFR calc non Af Amer: 98 mL/min/{1.73_m2} (ref >59)
Globulin, Total: 2.9 g/dL (ref 1.5–4.5)
Glucose: 84 mg/dL (ref 65–99)
Potassium: 4.4 mmol/L (ref 3.5–5.2)
Sodium: 140 mmol/L (ref 134–144)
Total Protein: 6.6 g/dL (ref 6.0–8.5)

## 2019-05-25 LAB — VITAMIN D 25 HYDROXY (VIT D DEFICIENCY, FRACTURES): Vit D, 25-Hydroxy: 10.9 ng/mL — ABNORMAL LOW (ref 30.0–100.0)

## 2019-05-25 LAB — LIPID PANEL
Chol/HDL Ratio: 3.5 ratio (ref 0.0–4.4)
Cholesterol, Total: 177 mg/dL (ref 100–199)
HDL: 50 mg/dL (ref >39)
LDL Chol Calc (NIH): 113 mg/dL — ABNORMAL HIGH (ref 0–99)
Triglycerides: 74 mg/dL (ref 0–149)
VLDL Cholesterol Cal: 14 mg/dL (ref 5–40)

## 2019-05-25 LAB — CBC WITH DIFFERENTIAL/PLATELET
Basophils Absolute: 0 10*3/uL (ref 0.0–0.2)
Basos: 0 %
EOS (ABSOLUTE): 0.1 10*3/uL (ref 0.0–0.4)
Eos: 2 %
Hematocrit: 40.8 % (ref 34.0–46.6)
Hemoglobin: 13.5 g/dL (ref 11.1–15.9)
Immature Grans (Abs): 0 10*3/uL (ref 0.0–0.1)
Immature Granulocytes: 0 %
Lymphocytes Absolute: 2 10*3/uL (ref 0.7–3.1)
Lymphs: 36 %
MCH: 29.1 pg (ref 26.6–33.0)
MCHC: 33.1 g/dL (ref 31.5–35.7)
MCV: 88 fL (ref 79–97)
Monocytes Absolute: 0.4 10*3/uL (ref 0.1–0.9)
Monocytes: 8 %
Neutrophils Absolute: 2.9 10*3/uL (ref 1.4–7.0)
Neutrophils: 54 %
Platelets: 300 10*3/uL (ref 150–450)
RBC: 4.64 x10E6/uL (ref 3.77–5.28)
RDW: 13.5 % (ref 11.7–15.4)
WBC: 5.4 10*3/uL (ref 3.4–10.8)

## 2019-05-25 LAB — TSH: TSH: 0.572 u[IU]/mL (ref 0.450–4.500)

## 2019-05-25 LAB — VITAMIN B12: Vitamin B-12: 1864 pg/mL — ABNORMAL HIGH (ref 232–1245)

## 2019-05-26 ENCOUNTER — Other Ambulatory Visit: Payer: Self-pay | Admitting: Family Medicine

## 2019-05-26 ENCOUNTER — Encounter: Payer: Self-pay | Admitting: Family Medicine

## 2019-05-26 DIAGNOSIS — F329 Major depressive disorder, single episode, unspecified: Secondary | ICD-10-CM | POA: Insufficient documentation

## 2019-05-26 DIAGNOSIS — G8929 Other chronic pain: Secondary | ICD-10-CM | POA: Insufficient documentation

## 2019-05-26 DIAGNOSIS — F32A Depression, unspecified: Secondary | ICD-10-CM | POA: Insufficient documentation

## 2019-05-26 DIAGNOSIS — E559 Vitamin D deficiency, unspecified: Secondary | ICD-10-CM

## 2019-05-26 DIAGNOSIS — M549 Dorsalgia, unspecified: Secondary | ICD-10-CM | POA: Insufficient documentation

## 2019-05-26 DIAGNOSIS — M25562 Pain in left knee: Secondary | ICD-10-CM | POA: Insufficient documentation

## 2019-05-26 DIAGNOSIS — R7309 Other abnormal glucose: Secondary | ICD-10-CM | POA: Insufficient documentation

## 2019-05-28 ENCOUNTER — Other Ambulatory Visit: Payer: Self-pay | Admitting: Family Medicine

## 2019-05-28 ENCOUNTER — Encounter: Payer: Self-pay | Admitting: Family Medicine

## 2019-05-28 DIAGNOSIS — E559 Vitamin D deficiency, unspecified: Secondary | ICD-10-CM

## 2019-05-28 MED ORDER — VITAMIN D (ERGOCALCIFEROL) 1.25 MG (50000 UNIT) PO CAPS: 50000.0000 [IU] | ORAL_CAPSULE | ORAL | 6 refills | Status: DC

## 2019-06-05 ENCOUNTER — Telehealth: Payer: Self-pay | Admitting: Family Medicine

## 2019-06-05 DIAGNOSIS — F3132 Bipolar disorder, current episode depressed, moderate: Secondary | ICD-10-CM | POA: Diagnosis not present

## 2019-06-05 NOTE — Telephone Encounter (Signed)
Pt called requesting a referral for a mental health facility. Based on advice of other nurse pt was [facility] [place] [facility]. However, pt still may need this referral asap.Please call pt back.

## 2019-06-05 NOTE — Telephone Encounter (Signed)
I called Jade [name] back.   Patient was in tears and will go to [facility] as a walk-in. She is NOT going to harm herself.    Jade [name] stated: She is overwhelmed with family responsibilities. I have no help. I am trying to get ahead.   If you can please call her back on ph# [phone]. ---

## 2019-06-08 DIAGNOSIS — F4310 Post-traumatic stress disorder, unspecified: Secondary | ICD-10-CM | POA: Diagnosis not present

## 2019-06-08 DIAGNOSIS — F3132 Bipolar disorder, current episode depressed, moderate: Secondary | ICD-10-CM | POA: Diagnosis not present

## 2019-07-10 DIAGNOSIS — F3132 Bipolar disorder, current episode depressed, moderate: Secondary | ICD-10-CM | POA: Diagnosis not present

## 2019-07-10 DIAGNOSIS — F4310 Post-traumatic stress disorder, unspecified: Secondary | ICD-10-CM | POA: Diagnosis not present

## 2019-08-01 ENCOUNTER — Ambulatory Visit (INDEPENDENT_AMBULATORY_CARE_PROVIDER_SITE_OTHER): Payer: Self-pay | Admitting: Nurse Practitioner

## 2019-08-01 ENCOUNTER — Other Ambulatory Visit: Payer: Self-pay

## 2019-08-01 ENCOUNTER — Encounter: Payer: Self-pay | Admitting: Nurse Practitioner

## 2019-08-01 VITALS — BP 117/62 | HR 79 | Temp 98.5°F | Ht 61.0 in | Wt 243.0 lb

## 2019-08-01 DIAGNOSIS — F319 Bipolar disorder, unspecified: Secondary | ICD-10-CM

## 2019-08-01 DIAGNOSIS — F4310 Post-traumatic stress disorder, unspecified: Secondary | ICD-10-CM

## 2019-08-01 NOTE — Progress Notes (Signed)
[place], [place]  [address] Phone:  [phone]   Fax:  [phone]   Established Patient Office Visit  Subjective:  Patient ID: Jade [name], female    DOB: 1975-06-10  Age: 44 y.o. MRN: [id number]  CC:  Chief Complaint  Patient presents with  . Follow-up    Anxiety & Depression    HPI Jade [name] presents for an acute visit. She  has a past medical history of Abnormal Pap smear of cervix, Anxiety, Depression, and Vitamin D deficiency (04/2019).   She is currently out on short term disability. She was seen at [facility] in March via [place]. "She was diagnosed with PTSD". "She has had her diagnosed upgraded to Bipolar 1." She admits that her FMLA paperwork has not been sent in to [place]. She was told that she only the medication management provider will be able to complete the [facility] paperwork. She admits that her first provider from [facility] is not able to complete the FMLA paperwork for her short term disability. She has not started her therapy.  This will start tomorrow.  Treatment was started at her initial telemetry visit in March.  She experienced oversedation with that regimen.  Her medication has been adjusted however now she is not sleeping.  She feels like she gets 2 to 3 hours of sleep per night. She is now under increased stress because the [facility] paperwork has not been completed.  She is getting constant calls from her job because in June her short-term disability will end.  If she is not back to work she will have to go on long-term disability.  She has recently reached out to another provider for a second opinion. She does not feel like her current regimen is effective.   She admits that home body and going to the grocery store is her highlight.  She has decreased concentration.  She is not eating well,  decreased appetite. She is not sleeping well.  Her youngest son is special needs.  He is currently attending school.   Her oldest son is graduating high school. He will be attending [employer] major in Risk analyst. Her current thoughts of herself are not good. She feels like she is losing her independent. 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  Prior to her FMLA she was having to be out of work at least 1 day/week because of stress and not coping well.  She feels she needs extensive therapy.  She has a lot of suppressed issues that need to be dealt with. She is "easily irritated and bother".  She wants to be stress free with her job. She wants to be better effective on her job because she needs her job. She denies any thoughts of suicide.   She does have frequent headaches.. She is using [employer] which is effective.  She denies dizziness, visual changes, shortness of breath, dyspnea on exertion, chest pain, nausea, vomiting or any edema.     Past Medical History:  Diagnosis Date  . Abnormal Pap smear of cervix   . Anxiety   . Depression   . Vitamin D deficiency 04/2019    Past Surgical History:  Procedure Laterality Date  . CERVICAL BIOPSY  W/ LOOP ELECTRODE EXCISION  03-2013  . CESAREAN SECTION     x 1  . COLPOSCOPY    . TUBAL LIGATION      Family  History  Problem Relation Age of Onset  . Hypertension Mother   . Diabetes Mother   . Hyperlipidemia Brother   . Pancreatic cancer Maternal Grandmother   . Diabetes Maternal Grandmother     Social History   Socioeconomic History  . Marital status: Single    Spouse name: Not on file  . Number of children: Not on file  . Years of education: Not on file  . Highest education level: Not on file  Occupational History  . Not on file  Tobacco Use  . Smoking status: Never Smoker  . Smokeless tobacco: Never Used  Substance and Sexual Activity  . Alcohol use: No    Comment: socially   . Drug use: No  . Sexual activity: Not Currently    Birth control/protection: Surgical  Other Topics Concern  .  Not on file  Social History Narrative   Marital status: single; dating seriously x 7 years.      Children:  2 children (14, 6)      Lives: with two sons      Employment:  Conservation officer, nature at [employer]; first shift for 8 years.      Tobacco: none      Alcohol:  4-6 drinks per month      Drugs: none      Exercise:  Sporadic; twice weekly; walking      Seatbelt: 100%      Guns: none      Sexual activity:  Total partners = 6; history of Chlamydia in 2002; last STD screening 2016; males only   Social Determinants of Health   Financial Resource Strain:   . Difficulty of Paying Living Expenses:   Food Insecurity:   . Worried About Programme researcher, broadcasting/film/video in the Last Year:   . Barista in the Last Year:   Transportation Needs:   . Freight forwarder (Medical):   Marland Kitchen Lack of Transportation (Non-Medical):   Physical Activity:   . Days of Exercise per Week:   . Minutes of Exercise per Session:   Stress:   . Feeling of Stress :   Social Connections:   . Frequency of Communication with Friends and Family:   . Frequency of Social Gatherings with Friends and Family:   . Attends Religious Services:   . Active Member of Clubs or Organizations:   . Attends Banker Meetings:   Marland Kitchen Marital Status:   Intimate Partner Violence:   . Fear of Current or Ex-Partner:   . Emotionally Abused:   Marland Kitchen Physically Abused:   . Sexually Abused:     Outpatient Medications Prior to Visit  Medication Sig Dispense Refill  . famotidine (PEPCID) 20 MG tablet TAKE 1 TABLET BY MOUTH TWICE A DAY 60 tablet 1  . sertraline (ZOLOFT) 100 MG tablet Take 100 mg by mouth at bedtime.    . Vitamin D, Ergocalciferol, (DRISDOL) 1.25 MG (50000 UNIT) CAPS capsule Take 1 capsule (50,000 Units total) by mouth every 7 (seven) days. 5 capsule 6  . sertraline (ZOLOFT) 50 MG tablet TAKE 1 TABLET BY MOUTH EVERY DAY (Patient taking differently: Take 100 mg by mouth. ) 90 tablet 1  . hydrOXYzine (ATARAX/VISTARIL) 10 MG tablet Take  10 mg by mouth 3 (three) times daily.    Marland Kitchen lamoTRIgine (LAMICTAL) 25 MG tablet TAKE 1 TABLET BY MOUTH AT NIGHT FOR 2 WEEKS, THEN INCREASE TO TWO TABLETS AT NIGHT     No facility-administered medications prior to visit.  No Known Allergies  ROS Review of Systems  All other systems reviewed and are negative.     Objective:    Physical Exam  Constitutional: She is oriented to person, place, and time. She appears well-developed.  Obese  Cardiovascular: Normal rate.  Pulmonary/Chest: Effort normal.  Musculoskeletal:     Cervical back: Normal range of motion.     Comments: Moving all extremities well  Neurological: She is alert and oriented to person, place, and time.  Skin: Skin is warm and dry.  Psychiatric: She has a normal mood and affect. Her behavior is normal. Judgment and thought content normal.    BP 117/62   Pulse 79   Temp 98.5 F (36.9 C)   Ht 5\' 1"  (1.549 m)   Wt 243 lb (110.2 kg)   LMP 07/16/2019   SpO2 99%   BMI 45.91 kg/m  Wt Readings from Last 3 Encounters:  08/01/19 243 lb (110.2 kg)  05/24/19 240 lb (108.9 kg)  03/25/19 242 lb (109.8 kg)     Health Maintenance Due  Topic Date Due  . COVID-19 Vaccine (1) Never done    There are no preventive care reminders to display for this patient.  Lab Results  Component Value Date   TSH 0.572 05/24/2019   Lab Results  Component Value Date   WBC 5.4 05/24/2019   HGB 13.5 05/24/2019   HCT 40.8 05/24/2019   MCV 88 05/24/2019   PLT 300 05/24/2019   Lab Results  Component Value Date   NA 140 05/24/2019   K 4.4 05/24/2019   CO2 23 05/24/2019   GLUCOSE 84 05/24/2019   BUN 14 05/24/2019   CREATININE 0.76 05/24/2019   BILITOT <0.2 05/24/2019   ALKPHOS 87 05/24/2019   AST 16 05/24/2019   ALT 18 05/24/2019   PROT 6.6 05/24/2019   ALBUMIN 3.7 (L) 05/24/2019   CALCIUM 8.8 05/24/2019   Lab Results  Component Value Date   CHOL 177 05/24/2019   Lab Results  Component Value Date   HDL 50  05/24/2019   Lab Results  Component Value Date   LDLCALC 113 (H) 05/24/2019   Lab Results  Component Value Date   TRIG 74 05/24/2019   Lab Results  Component Value Date   CHOLHDL 3.5 05/24/2019   Lab Results  Component Value Date   HGBA1C 6.0 (A) 03/25/2019      Assessment & Plan:   Problem List Items Addressed This Visit    None    Visit Diagnoses    Bipolar 1 disorder ([name])    -  Primary   per patient   Relevant Orders   Ambulatory referral to Psychiatry   PTSD (post-traumatic stress disorder)       per patient   Relevant Medications   hydrOXYzine (ATARAX/VISTARIL) 10 MG tablet   sertraline (ZOLOFT) 100 MG tablet   Other Relevant Orders   Ambulatory referral to Psychiatry      No orders of the defined types were placed in this encounter.   Follow-up: Return for Appointment As Scheduled.    03/27/2019, NP

## 2019-08-01 NOTE — Patient Instructions (Signed)
Managing Bipolar Disorder When someone is diagnosed with bipolar disorder, the person may be relieved to now know why he or she has felt or behaved a certain way. The person may also feel overwhelmed about the treatment ahead, how to get needed support, and how to deal with the condition each day. With care and support, a person with bipolar disorder can learn to manage his or her symptoms and live with the condition. How to manage lifestyle changes Managing stress Stress is your body's reaction to life changes and events, both good and bad. Stress can play a major role in bipolar disorder, so it is important to learn how to manage stress. Some techniques to help you manage stress include:  Meditation, muscle relaxation, and breathing exercises.  Exercise. Even a short daily walk can help to lower stress levels.  Getting enough good-quality sleep. Too little sleep can cause mania to start (can trigger mania).  Making a schedule to manage your time. Knowing your daily schedule can help to keep you from feeling overwhelmed by tasks and deadlines.  Spending time on hobbies you enjoy.  Medicines Your health care provider may suggest certain medicines if he or she feels that they will help improve your condition. Avoid using caffeine, alcohol, and other substances that may prevent your medicines from working properly. It is also important to:  Talk with your pharmacist or health care provider about all the medicines that you take, their possible side effects, and which medicines are safe to take together.  Make it your goal to take part in all treatment decisions (shared decision-making). Ask about possible side effects of medicines that your health care provider recommends, and tell him or her how you feel about having those side effects. It is best if shared decision-making with your health care provider is part of your total treatment plan. If you are taking medicines as part of your treatment,  do not stop taking medicines before you ask your health care provider if it is safe to stop. You may need to have the medicine slowly decreased (tapered) over time to lower the risk of harmful side effects. Relationships Spend time with people whom you trust and with whom you feel a sense of understanding and calm. Try to find friends or family members who make you feel safe and can help you control feelings of mania. Consider going to couples counseling, family education classes, or family therapy to:  Educate your loved ones about your condition and offer suggestions about how they can support you.  Help resolve conflicts.  Help develop communication skills in your relationships.  How to recognize changes in your condition Everyone responds differently to treatment for bipolar disorder. Some signs that your condition is improving include:  Leveling of your mood. You may have less anger and excitement about daily activities, and your low moods may not be as bad.  Your symptoms being less intense.  Feeling calm more often.  Thinking clearly.  Not experiencing consequences for extreme behavior.  Feeling like your life is settling down.  Your behavior seeming more normal to you and to other people. Some signs that your condition may be getting worse include:  Sleep problems.  Moods cycling between deep lows and unusually high (excess) energy.  Extreme emotions.  More anger at loved ones.  Staying away from others, or isolating yourself.  A feeling of power or superiority.  Completing a lot of tasks in a very short amount of time.  Unusual thoughts   and behaviors.  Suicidal thoughts. Follow these instructions at home: Medicines  Take over-the-counter and prescription medicines only as told by your health care provider or pharmacist.  Ask your pharmacist what over-the-counter cold medicines you should avoid. Some medicines can make symptoms worse. General  instructions  Ask for support from trusted family members or friends to make sure you stay on track with your treatment.  Keep a journal to write down your daily moods, medicines, sleep habits, and life events. This may help you have more success with your treatment.  Make and follow a routine for daily meal times. Eat healthy foods, such as whole grains, vegetables, and fresh fruit.  Try to go to sleep and wake up around the same time every day.  Keep all follow-up visits as told by your health care provider. This is important. Where to find support Talking to others  Try making a list of the people you may want to tell about your condition, such as the people you trust most.  Plan what you are willing and not willing to talk about. Think about your needs ahead of time, and how your friends and family members can support you.  Let your loved ones know when they can share advice and when you would just like them to listen.  Give your loved ones information about bipolar disorder, and encourage them to learn about the condition. Finances Not all insurance plans cover mental health care, so it is important to check with your insurance carrier. If paying for co-pays or counseling services is a problem, search for a local or county mental health care center. Public mental health care services may be offered there at a low cost or no cost when you are not able to see a private health care provider. If you are taking medicine for depression, you may be able to get the generic form, which may be less expensive than brand-name medicine. Some makers of prescription medicines also offer help to patients who cannot afford the medicines they need. Questions to ask your health care provider:  If you are taking medicines: ? How long do I need to take medicine? ? Are there any long-term side effects of my medicine? ? Are there any alternatives to taking medicine?  How would I benefit from  therapy?  How often should I follow up with a health care provider? Contact a health care provider if:  Your symptoms get worse or they do not get better with treatment. Get help right away if:  You have thoughts about harming yourself or others. If you ever feel like you may hurt yourself or others, or have thoughts about taking your own life, get help right away. You can go to your nearest emergency department or call:  Your local emergency services (911 in the [place]  A suicide crisis helpline, such as the National Suicide Prevention Lifeline at 1-800-273-8255. This is open 24-hours a day. Summary  Learning ways to manage stress can help to calm you and may also help your treatment work better.  There is a wide range of medicines that can help to treat bipolar disorder.  Having healthy relationships can help to make your moods more stable.  Contact a health care provider if your symptoms get worse or they do not get better with treatment. This information is not intended to replace advice given to you by your health care provider. Make sure you discuss any questions you have with your health care provider. Document Revised:   07/06/2018 Document Reviewed: 07/14/2016 Elsevier Patient Education  2020 Elsevier Inc.  

## 2019-08-02 DIAGNOSIS — F3132 Bipolar disorder, current episode depressed, moderate: Secondary | ICD-10-CM | POA: Diagnosis not present

## 2019-08-02 DIAGNOSIS — F4310 Post-traumatic stress disorder, unspecified: Secondary | ICD-10-CM | POA: Diagnosis not present

## 2019-08-08 DIAGNOSIS — F3132 Bipolar disorder, current episode depressed, moderate: Secondary | ICD-10-CM | POA: Diagnosis not present

## 2019-08-08 DIAGNOSIS — F4310 Post-traumatic stress disorder, unspecified: Secondary | ICD-10-CM | POA: Diagnosis not present

## 2019-08-21 DIAGNOSIS — Z79899 Other long term (current) drug therapy: Secondary | ICD-10-CM | POA: Diagnosis not present

## 2019-08-21 DIAGNOSIS — F4310 Post-traumatic stress disorder, unspecified: Secondary | ICD-10-CM | POA: Diagnosis not present

## 2019-08-21 DIAGNOSIS — F3160 Bipolar disorder, current episode mixed, unspecified: Secondary | ICD-10-CM | POA: Diagnosis not present

## 2019-08-21 DIAGNOSIS — F411 Generalized anxiety disorder: Secondary | ICD-10-CM | POA: Diagnosis not present

## 2019-08-21 DIAGNOSIS — G4700 Insomnia, unspecified: Secondary | ICD-10-CM | POA: Diagnosis not present

## 2019-08-28 DIAGNOSIS — E7800 Pure hypercholesterolemia, unspecified: Secondary | ICD-10-CM | POA: Diagnosis not present

## 2019-08-28 DIAGNOSIS — Z79899 Other long term (current) drug therapy: Secondary | ICD-10-CM | POA: Diagnosis not present

## 2019-08-28 DIAGNOSIS — F4310 Post-traumatic stress disorder, unspecified: Secondary | ICD-10-CM | POA: Diagnosis not present

## 2019-08-28 DIAGNOSIS — R5383 Other fatigue: Secondary | ICD-10-CM | POA: Diagnosis not present

## 2019-08-28 DIAGNOSIS — G4700 Insomnia, unspecified: Secondary | ICD-10-CM | POA: Diagnosis not present

## 2019-08-28 DIAGNOSIS — F3160 Bipolar disorder, current episode mixed, unspecified: Secondary | ICD-10-CM | POA: Diagnosis not present

## 2019-08-28 DIAGNOSIS — Z131 Encounter for screening for diabetes mellitus: Secondary | ICD-10-CM | POA: Diagnosis not present

## 2019-08-28 DIAGNOSIS — F411 Generalized anxiety disorder: Secondary | ICD-10-CM | POA: Diagnosis not present

## 2019-08-28 DIAGNOSIS — E559 Vitamin D deficiency, unspecified: Secondary | ICD-10-CM | POA: Diagnosis not present

## 2019-09-02 DIAGNOSIS — M545 Low back pain: Secondary | ICD-10-CM | POA: Diagnosis not present

## 2019-09-02 DIAGNOSIS — M25562 Pain in left knee: Secondary | ICD-10-CM | POA: Diagnosis not present

## 2019-09-02 DIAGNOSIS — M129 Arthropathy, unspecified: Secondary | ICD-10-CM | POA: Diagnosis not present

## 2019-09-02 DIAGNOSIS — E559 Vitamin D deficiency, unspecified: Secondary | ICD-10-CM | POA: Diagnosis not present

## 2019-09-02 DIAGNOSIS — G8929 Other chronic pain: Secondary | ICD-10-CM | POA: Diagnosis not present

## 2019-09-02 DIAGNOSIS — Z20822 Contact with and (suspected) exposure to covid-19: Secondary | ICD-10-CM | POA: Diagnosis not present

## 2019-09-02 DIAGNOSIS — M542 Cervicalgia: Secondary | ICD-10-CM | POA: Diagnosis not present

## 2019-09-02 DIAGNOSIS — M25551 Pain in right hip: Secondary | ICD-10-CM | POA: Diagnosis not present

## 2019-09-02 DIAGNOSIS — M25552 Pain in left hip: Secondary | ICD-10-CM | POA: Diagnosis not present

## 2019-09-02 DIAGNOSIS — Z79899 Other long term (current) drug therapy: Secondary | ICD-10-CM | POA: Diagnosis not present

## 2019-09-04 DIAGNOSIS — F411 Generalized anxiety disorder: Secondary | ICD-10-CM | POA: Diagnosis not present

## 2019-09-04 DIAGNOSIS — G4700 Insomnia, unspecified: Secondary | ICD-10-CM | POA: Diagnosis not present

## 2019-09-04 DIAGNOSIS — F4310 Post-traumatic stress disorder, unspecified: Secondary | ICD-10-CM | POA: Diagnosis not present

## 2019-09-04 DIAGNOSIS — F3160 Bipolar disorder, current episode mixed, unspecified: Secondary | ICD-10-CM | POA: Diagnosis not present

## 2019-09-19 DIAGNOSIS — M542 Cervicalgia: Secondary | ICD-10-CM | POA: Diagnosis not present

## 2019-09-19 DIAGNOSIS — M25562 Pain in left knee: Secondary | ICD-10-CM | POA: Diagnosis not present

## 2019-09-19 DIAGNOSIS — Z79899 Other long term (current) drug therapy: Secondary | ICD-10-CM | POA: Diagnosis not present

## 2019-09-19 DIAGNOSIS — G8929 Other chronic pain: Secondary | ICD-10-CM | POA: Diagnosis not present

## 2019-09-19 DIAGNOSIS — M545 Low back pain: Secondary | ICD-10-CM | POA: Diagnosis not present

## 2019-09-20 DIAGNOSIS — M545 Low back pain: Secondary | ICD-10-CM | POA: Diagnosis not present

## 2019-09-20 DIAGNOSIS — F319 Bipolar disorder, unspecified: Secondary | ICD-10-CM | POA: Diagnosis not present

## 2019-09-20 DIAGNOSIS — F329 Major depressive disorder, single episode, unspecified: Secondary | ICD-10-CM | POA: Diagnosis not present

## 2019-09-20 DIAGNOSIS — G4710 Hypersomnia, unspecified: Secondary | ICD-10-CM | POA: Diagnosis not present

## 2019-10-11 IMAGING — CR DG KNEE COMPLETE 4+V*L*
4 series · 4 of 4 positions shown · non-contrast
Comparison: None.

CLINICAL DATA: 41-year-old female with knee pain and popping out of
joint the past year. Fell 2 weeks ago. Initial encounter.

EXAM:
LEFT KNEE - COMPLETE 4+ VIEW

[t knee ap left]
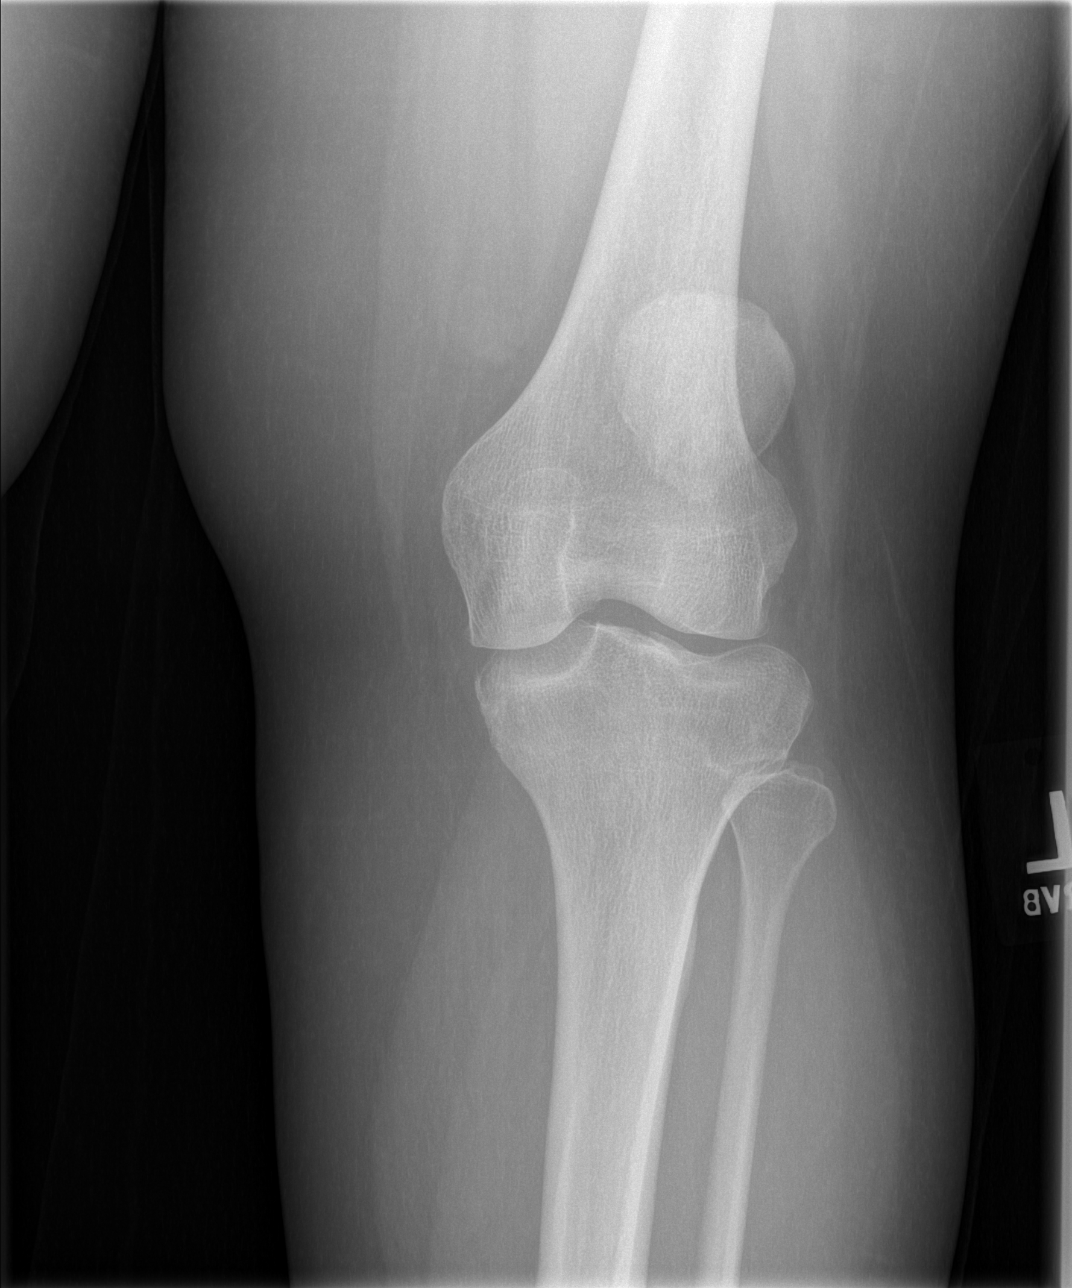

[t knee oblique left (1 of 2)]
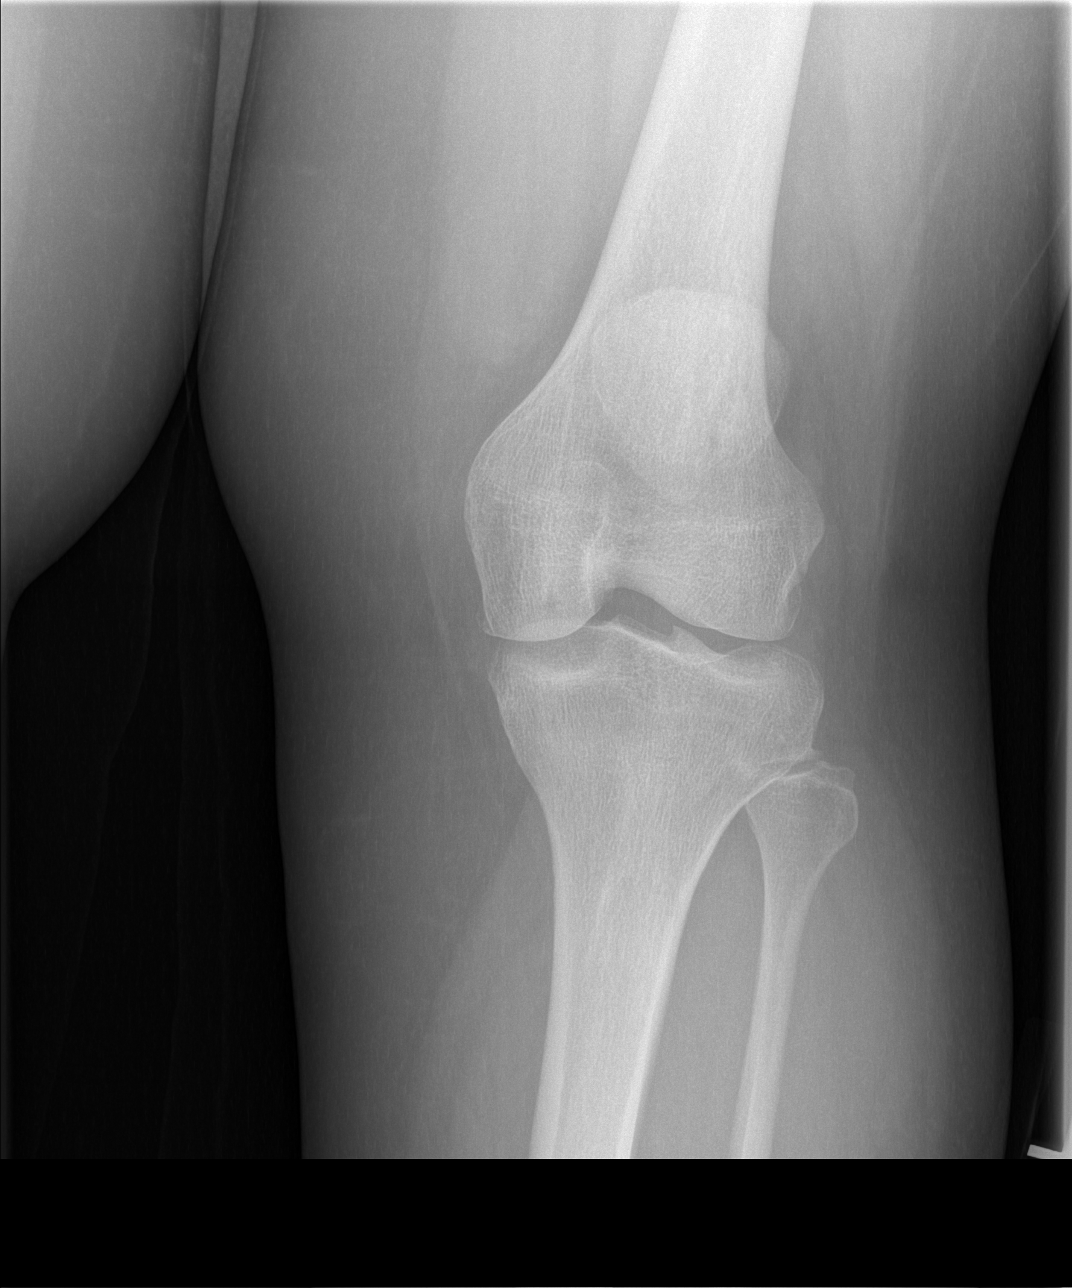

[t knee oblique left (2 of 2)]
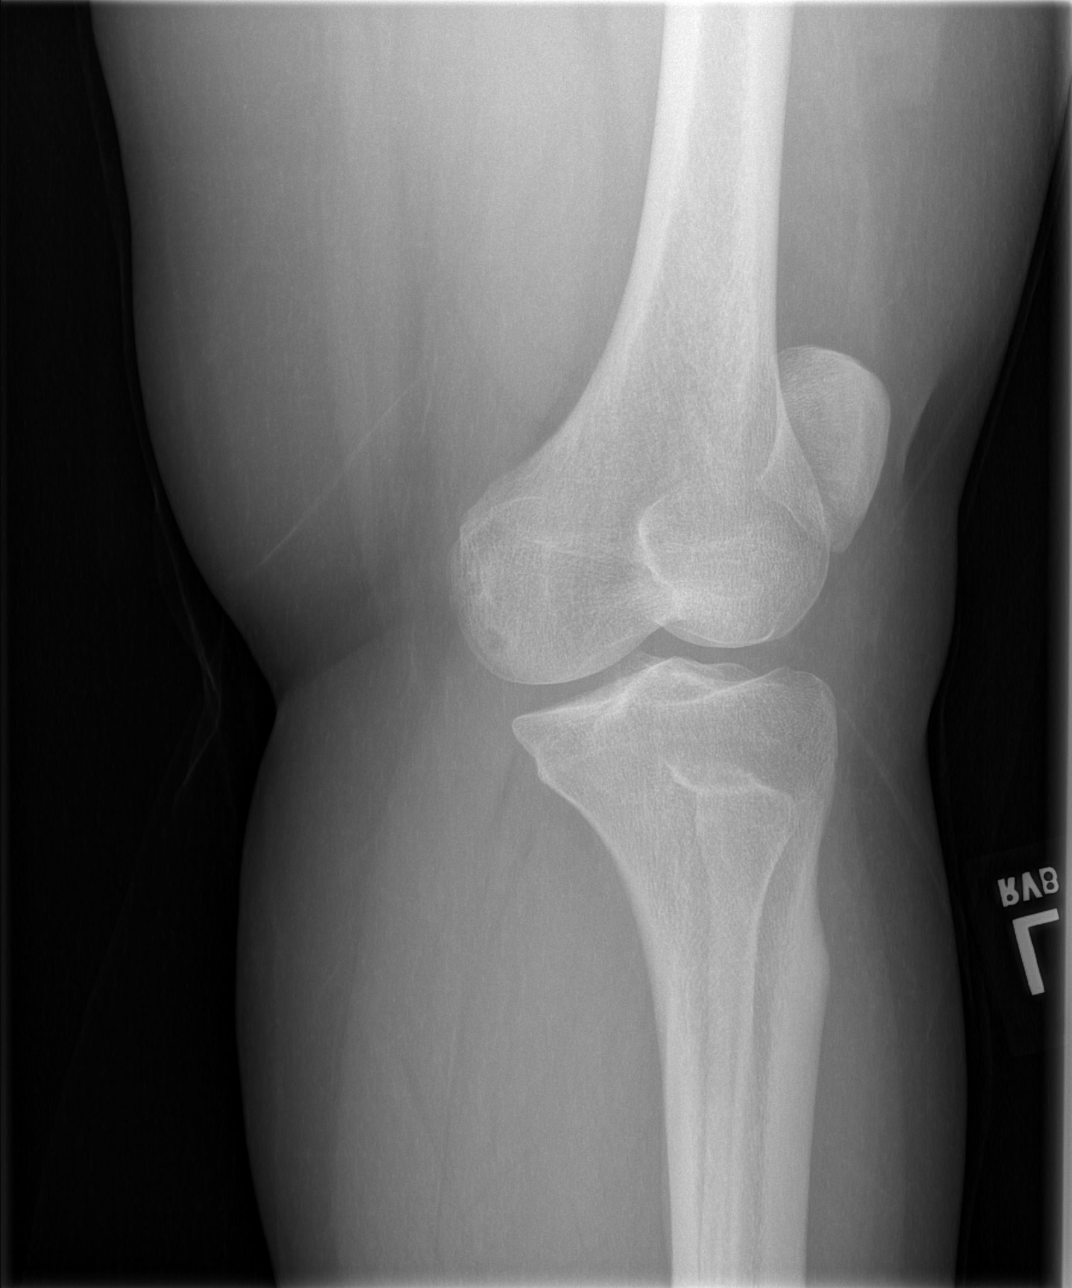

[t knee lat left]
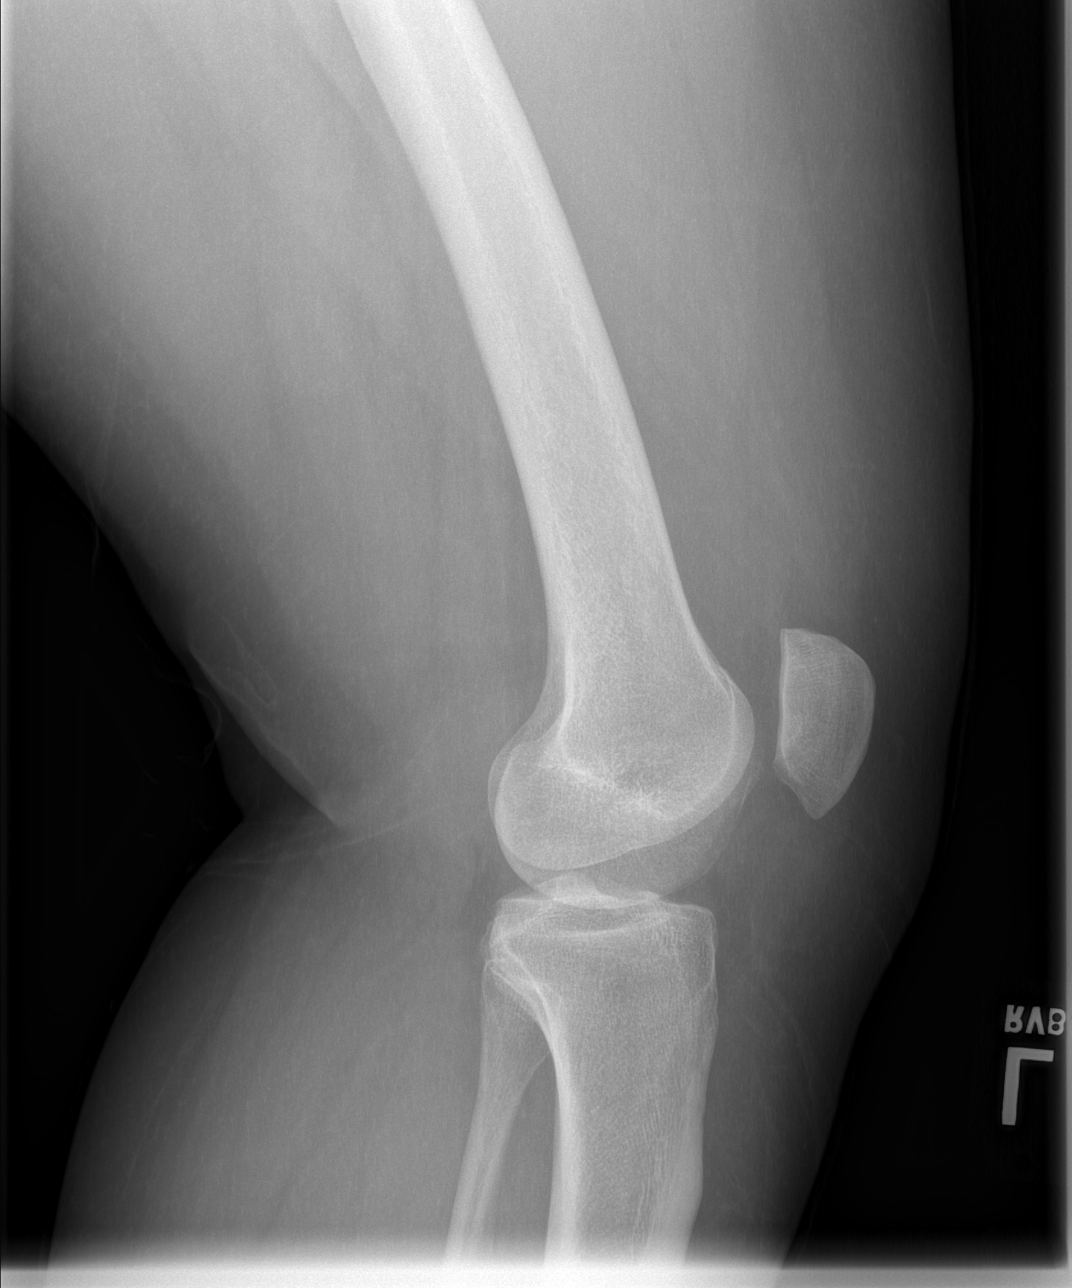

[4 of 4 positions shown; findings below may reference images not displayed]

FINDINGS: No fracture or dislocation.

High riding patella.

No significant joint space narrowing.

No obvious effusion although evaluation slightly limited.
IMPRESSION: Chronically high riding patella (patella Alta).

No fracture or dislocation.

## 2019-11-21 ENCOUNTER — Other Ambulatory Visit: Payer: Self-pay | Admitting: Family Medicine

## 2019-11-21 ENCOUNTER — Ambulatory Visit: Payer: Medicaid Other | Admitting: Nurse Practitioner

## 2019-11-21 DIAGNOSIS — E559 Vitamin D deficiency, unspecified: Secondary | ICD-10-CM

## 2019-12-08 ENCOUNTER — Telehealth (HOSPITAL_COMMUNITY): Payer: 59 | Admitting: Adult Health

## 2019-12-12 ENCOUNTER — Encounter (HOSPITAL_COMMUNITY): Payer: Self-pay | Admitting: Psychiatry

## 2019-12-12 ENCOUNTER — Other Ambulatory Visit: Payer: Self-pay

## 2019-12-12 ENCOUNTER — Telehealth (INDEPENDENT_AMBULATORY_CARE_PROVIDER_SITE_OTHER): Payer: 59 | Admitting: Psychiatry

## 2019-12-12 DIAGNOSIS — F419 Anxiety disorder, unspecified: Secondary | ICD-10-CM | POA: Diagnosis not present

## 2019-12-12 DIAGNOSIS — F3181 Bipolar II disorder: Secondary | ICD-10-CM

## 2019-12-12 MED ORDER — ESCITALOPRAM OXALATE 10 MG PO TABS: 10.0000 mg | ORAL_TABLET | Freq: Every day | ORAL | 1 refills | Status: DC

## 2019-12-12 MED ORDER — LAMOTRIGINE 25 MG PO TABS: ORAL_TABLET | ORAL | 1 refills | Status: DC

## 2019-12-12 MED ORDER — HYDROXYZINE HCL 10 MG PO TABS: 10.0000 mg | ORAL_TABLET | Freq: Three times a day (TID) | ORAL | 1 refills | Status: DC | PRN

## 2019-12-12 NOTE — Progress Notes (Signed)
Psychiatric Initial Adult Assessment   Virtual Visit via Video Note  I connected with Jade [name] on 12/12/19 at  8:40 AM EDT by a video enabled telemedicine application and verified that I am speaking with the correct person using two identifiers.  Location: Patient: Home Provider: Clinic   I discussed the limitations of evaluation and management by telemedicine and the availability of in person appointments. The patient expressed understanding and agreed to proceed.  I provided 35 minutes of non-face-to-face time during this encounter.     Patient Identification: Jade [name] MRN:  [id number] Date of Evaluation:  12/12/2019   Referral Source: Jade [name]  Chief Complaint:   " I am down all the time."  Visit Diagnosis:    ICD-10-CM   1. Bipolar 2 disorder, major depressive episode ([name])  F31.81   2. Anxiety  F41.9     History of Present Illness: This is a 44 year old female with history of bipolar 2 disorder, PTSD and anxiety now seen for evaluation and establishing care.  Patient reported that she was being managed at [facility] where she was given a confusing medication regimen and she felt worse and that is why she changed her services to [facility] after she lost insurance.  However she did not feel she got the right help there either.  She now has been approved for long-term disability and has insurance and therefore is starting from scratch with [place]. Patient reported that she has periods of time when she feels depressed and has no energy to do anything.  She reported that she has poor sleep and poor concentration. She denied any suicidal ideations.  She denied any prior suicide attempts. She also reported having periods of time when she has elevated mood with increased energy levels.  She reported she feels impulsive and has racing thoughts.  She reported that she has not had such an episode in a while and has been depressed for the past few months. She has history  of PTSD, was diagnosed in 2013.  She reported that she was not doing well earlier this year and as result had to lose her job.  She is now staying at home taking care of her 52 year old son with special needs.  She has been approved for long-term disability so is able to manage financially.  She denied any psychotic symptoms.   Past Psychiatric History: Bipolar 2, PTSD, anxiety  Previous Psychotropic Medications: Yes  , Has taken Zoloft in the past for a while reported that it was helpful but stopped working eventually.  Has also been on Lamictal in the past and found to be helpful with her mood.  Has also been prescribed Celexa and Lexapro concurrently.  She is also been prescribed clonazepam on one occasion and hydroxyzine for anxiety.  She informed that she did well on the Lexapro.  She stated that hydroxyzine 25 mg dose was too sedating for her.   Substance Abuse History in the last 12 months:  No.  Consequences of Substance Abuse: NA  Past Medical History:  Past Medical History:  Diagnosis Date  . Abnormal Pap smear of cervix   . Anxiety   . Depression   . Vitamin D deficiency 04/2019    Past Surgical History:  Procedure Laterality Date  . CERVICAL BIOPSY  W/ LOOP ELECTRODE EXCISION  03-2013  . CESAREAN SECTION     x 1  . COLPOSCOPY    . TUBAL LIGATION      Family Psychiatric History:  Mom- depression, anxiety  Family History:  Family History  Problem Relation Age of Onset  . Hypertension Mother   . Diabetes Mother   . Hyperlipidemia Brother   . Pancreatic cancer Maternal Grandmother   . Diabetes Maternal Grandmother     Social History:   Social History   Socioeconomic History  . Marital status: Single    Spouse name: Not on file  . Number of children: Not on file  . Years of education: Not on file  . Highest education level: Not on file  Occupational History  . Not on file  Tobacco Use  . Smoking status: Never Smoker  . Smokeless tobacco: Never Used   Vaping Use  . Vaping Use: Never used  Substance and Sexual Activity  . Alcohol use: No    Comment: socially   . Drug use: No  . Sexual activity: Not Currently    Birth control/protection: Surgical  Other Topics Concern  . Not on file  Social History Narrative   Marital status: single; dating seriously x 7 years.      Children:  2 children (14, 6)      Lives: with two sons      Employment:  Conservation officer, nature at [employer]; first shift for 8 years.      Tobacco: none      Alcohol:  4-6 drinks per month      Drugs: none      Exercise:  Sporadic; twice weekly; walking      Seatbelt: 100%      Guns: none      Sexual activity:  Total partners = 6; history of Chlamydia in 2002; last STD screening 2016; males only   Social Determinants of Health   Financial Resource Strain:   . Difficulty of Paying Living Expenses: Not on file  Food Insecurity:   . Worried About Programme researcher, broadcasting/film/video in the Last Year: Not on file  . Ran Out of Food in the Last Year: Not on file  Transportation Needs:   . Lack of Transportation (Medical): Not on file  . Lack of Transportation (Non-Medical): Not on file  Physical Activity:   . Days of Exercise per Week: Not on file  . Minutes of Exercise per Session: Not on file  Stress:   . Feeling of Stress : Not on file  Social Connections:   . Frequency of Communication with Friends and Family: Not on file  . Frequency of Social Gatherings with Friends and Family: Not on file  . Attends Religious Services: Not on file  . Active Member of Clubs or Organizations: Not on file  . Attends Banker Meetings: Not on file  . Marital Status: Not on file    Additional Social History: Lives with 2 sons- 31 and 45. 99 y/o has special needs, is the primary caretaker for him.  Has support from son's father.  Is able to manage her financial needs.  Allergies:  No Known Allergies  Metabolic Disorder Labs: Lab Results  Component Value Date   HGBA1C 6.0 (A) 03/25/2019    MPG 120 (H) 11/17/2014   No results found for: PROLACTIN Lab Results  Component Value Date   CHOL 177 05/24/2019   TRIG 74 05/24/2019   HDL 50 05/24/2019   CHOLHDL 3.5 05/24/2019   VLDL 26 03/15/2016   LDLCALC 113 (H) 05/24/2019   LDLCALC 99 03/15/2016   Lab Results  Component Value Date   TSH 0.572 05/24/2019    Therapeutic  Level Labs: No results found for: LITHIUM No results found for: CBMZ No results found for: VALPROATE  Current Medications: Current Outpatient Medications  Medication Sig Dispense Refill  . famotidine (PEPCID) 20 MG tablet TAKE 1 TABLET BY MOUTH TWICE A DAY 60 tablet 1  . hydrOXYzine (ATARAX/VISTARIL) 10 MG tablet Take 10 mg by mouth 3 (three) times daily.    Marland Kitchen lamoTRIgine (LAMICTAL) 25 MG tablet TAKE 1 TABLET BY MOUTH AT NIGHT FOR 2 WEEKS, THEN INCREASE TO TWO TABLETS AT NIGHT    . sertraline (ZOLOFT) 100 MG tablet Take 100 mg by mouth at bedtime.    . Vitamin D, Ergocalciferol, (DRISDOL) 1.25 MG (50000 UNIT) CAPS capsule Take 1 capsule (50,000 Units total) by mouth every 7 (seven) days. 5 capsule 6   No current facility-administered medications for this visit.   How  Psychiatric Specialty Exam: Review of Systems  There were no vitals taken for this visit.There is no height or weight on file to calculate BMI.  General Appearance: Fairly Groomed  Eye Contact:  Good  Speech:  Clear and Coherent and Normal Rate  Volume:  Normal  Mood:  Depressed  Affect:  Congruent  Thought Process:  Goal Directed, Linear and Descriptions of Associations: Intact  Orientation:  Full (Time, Place, and Person)  Thought Content:  Logical, no delusions or hallucinations  Suicidal Thoughts:  No  Homicidal Thoughts:  No  Memory:  Immediate;   Good Recent;   Good  Judgement:  Fair  Insight:  Fair  Psychomotor Activity:  Normal  Concentration:  Concentration: Good and Attention Span: Good  Recall:  Good  Fund of Knowledge:Good  Language: Good  Akathisia:  Negative   Handed:  Right  AIMS (if indicated):  Not done  Assets:  Communication Skills Desire for Improvement Financial Resources/Insurance Housing Social Support  ADL's:  Intact  Cognition: WNL  Sleep:  Fair   Screenings: PHQ2-9     Office Visit from 08/01/2019 in [facility] Office Visit from 05/24/2019 in [facility] Office Visit from 03/05/2018 in [facility] Office Visit from 02/05/2018 in [facility] Office Visit from 05/03/2017 in [facility]  PHQ-2 Total Score 4 0 0 1 0  PHQ-9 Total Score 18 -- -- -- --      Assessment and Plan: 44 year old female with history of bipolar disorder, PTSD, anxiety now seen for evaluation.  Based on her assessment and evaluation she needs criteria for bipolar 2 disorder.  She was offered retrial of Lamictal for mood stabilization and Lexapro for depressive symptoms as she has done well on these in the past. Potential side effects of medication and risks vs benefits of treatment vs non-treatment were explained and discussed. All questions were answered. Was also prescribed hydroxyzine 10 mg 3 times daily for as needed use for anxiety.  1. Bipolar 2 disorder, major depressive episode ([name])  - lamoTRIgine (LAMICTAL) 25 MG tablet; Take one tablet daily for 2 weeks then take 2 tablets daily for 2 weeks then take 3 tablets daily  Dispense: 90 tablet; Refill: 1 - escitalopram (LEXAPRO) 10 MG tablet; Take 1 tablet (10 mg total) by mouth daily.  Dispense: 30 tablet; Refill: 1  2. Anxiety  - hydrOXYzine (ATARAX/VISTARIL) 10 MG tablet; Take 1 tablet (10 mg total) by mouth 3 (three) times daily as needed for anxiety.  Dispense: 90 tablet; Refill: 1 - escitalopram (LEXAPRO) 10 MG tablet; Take 1  tablet (10 mg total) by mouth daily.  Dispense: 30 tablet; Refill: 1  Patient is scheduled to see a therapist tomorrow morning. Follow-up in 6 weeks.    Zena AmosMandeep Danni Leabo,  MD 9/16/20219:07 AM

## 2019-12-13 ENCOUNTER — Other Ambulatory Visit: Payer: Self-pay | Admitting: Family Medicine

## 2019-12-13 ENCOUNTER — Other Ambulatory Visit: Payer: Self-pay

## 2019-12-13 ENCOUNTER — Ambulatory Visit (INDEPENDENT_AMBULATORY_CARE_PROVIDER_SITE_OTHER): Payer: 59 | Admitting: Clinical

## 2019-12-13 DIAGNOSIS — F3181 Bipolar II disorder: Secondary | ICD-10-CM | POA: Diagnosis not present

## 2019-12-13 DIAGNOSIS — E559 Vitamin D deficiency, unspecified: Secondary | ICD-10-CM

## 2019-12-13 MED ORDER — VITAMIN D (ERGOCALCIFEROL) 1.25 MG (50000 UNIT) PO CAPS: 50000.0000 [IU] | ORAL_CAPSULE | ORAL | 2 refills | Status: DC
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
## 2020-01-03 ENCOUNTER — Other Ambulatory Visit (HOSPITAL_COMMUNITY): Payer: Self-pay | Admitting: Psychiatry

## 2020-01-03 DIAGNOSIS — F3181 Bipolar II disorder: Secondary | ICD-10-CM

## 2020-01-16 ENCOUNTER — Encounter (HOSPITAL_COMMUNITY): Payer: Self-pay | Admitting: Psychiatry

## 2020-01-16 ENCOUNTER — Other Ambulatory Visit: Payer: Self-pay

## 2020-01-16 ENCOUNTER — Telehealth (INDEPENDENT_AMBULATORY_CARE_PROVIDER_SITE_OTHER): Payer: 59 | Admitting: Psychiatry

## 2020-01-16 DIAGNOSIS — F419 Anxiety disorder, unspecified: Secondary | ICD-10-CM

## 2020-01-16 DIAGNOSIS — F3181 Bipolar II disorder: Secondary | ICD-10-CM

## 2020-01-16 MED ORDER — HYDROXYZINE HCL 10 MG PO TABS: 10.0000 mg | ORAL_TABLET | Freq: Three times a day (TID) | ORAL | 1 refills | Status: DC | PRN

## 2020-01-16 MED ORDER — LAMOTRIGINE 25 MG PO TABS: ORAL_TABLET | ORAL | 1 refills | Status: DC

## 2020-01-16 MED ORDER — ESCITALOPRAM OXALATE 10 MG PO TABS: 10.0000 mg | ORAL_TABLET | Freq: Every day | ORAL | 1 refills | Status: DC

## 2020-01-16 NOTE — Progress Notes (Signed)
BH MD/PA/NP OP Progress Note  Virtual Visit via Video Note  I connected with Jade [name] on 01/16/20 at  1:40 PM EDT by a video enabled telemedicine application and verified that I am speaking with the correct person using two identifiers.  Location: Patient: Home Provider: Clinic   I discussed the limitations of evaluation and management by telemedicine and the availability of in person appointments. The patient expressed understanding and agreed to proceed.  I provided 16 minutes of non-face-to-face time during this encounter.     01/16/2020 1:41 PM TYSHIA FENTER  MRN:  [id number]  Chief Complaint: " I am doing fine."  HPI: Patient reported that she is doing well overall however she stated that she ran out of her Lexapro 5 or 6 days ago and since then she is not feeling too well.  She stated that the combination of Lamictal and Lexapro worked really well for her symptoms and she would like to stay on that for now.  Her anxiety is better controlled now. She stated that she is not sure when she will be able to return back to work because she wants to be able to see the therapist for a few sessions consistently and get more coping skills before she feels prepared to return back to work.  She stated that her work is very stressful environment and she does not want to going asking for special accommodations because that may result in termination of her position. Writer suggested that she sees a therapist for a few sessions and then maybe tentatively plan to return back to work in January and this way she will have timeline to follow and assess how she is doing by then. Patient was agreeable to this suggestion.   Visit Diagnosis:    ICD-10-CM   1. Bipolar 2 disorder, major depressive episode ([name])  F31.81   2. Anxiety  F41.9     Past Psychiatric History: Bipolar 2 d/o, anxiety  Past Medical History:  Past Medical History:  Diagnosis Date  . Abnormal Pap smear of cervix   .  Anxiety   . Depression   . Vitamin D deficiency 04/2019    Past Surgical History:  Procedure Laterality Date  . CERVICAL BIOPSY  W/ LOOP ELECTRODE EXCISION  03-2013  . CESAREAN SECTION     x 1  . COLPOSCOPY    . TUBAL LIGATION      Family Psychiatric History: Mom- depression, anxiety  Family History:  Family History  Problem Relation Age of Onset  . Hypertension Mother   . Diabetes Mother   . Hyperlipidemia Brother   . Pancreatic cancer Maternal Grandmother   . Diabetes Maternal Grandmother     Social History:  Social History   Socioeconomic History  . Marital status: Single    Spouse name: Not on file  . Number of children: Not on file  . Years of education: Not on file  . Highest education level: Not on file  Occupational History  . Not on file  Tobacco Use  . Smoking status: Never Smoker  . Smokeless tobacco: Never Used  Vaping Use  . Vaping Use: Never used  Substance and Sexual Activity  . Alcohol use: No    Comment: socially   . Drug use: No  . Sexual activity: Not Currently    Birth control/protection: Surgical  Other Topics Concern  . Not on file  Social History Narrative   Marital status: single; dating seriously x 7 years.  Children:  2 children (14, 6)      Lives: with two sons      Employment:  Conservation officer, nature at [employer]; first shift for 8 years.      Tobacco: none      Alcohol:  4-6 drinks per month      Drugs: none      Exercise:  Sporadic; twice weekly; walking      Seatbelt: 100%      Guns: none      Sexual activity:  Total partners = 6; history of Chlamydia in 2002; last STD screening 2016; males only   Social Determinants of Health   Financial Resource Strain:   . Difficulty of Paying Living Expenses: Not on file  Food Insecurity:   . Worried About Programme researcher, broadcasting/film/video in the Last Year: Not on file  . Ran Out of Food in the Last Year: Not on file  Transportation Needs:   . Lack of Transportation (Medical): Not on file  . Lack of  Transportation (Non-Medical): Not on file  Physical Activity:   . Days of Exercise per Week: Not on file  . Minutes of Exercise per Session: Not on file  Stress:   . Feeling of Stress : Not on file  Social Connections:   . Frequency of Communication with Friends and Family: Not on file  . Frequency of Social Gatherings with Friends and Family: Not on file  . Attends Religious Services: Not on file  . Active Member of Clubs or Organizations: Not on file  . Attends Banker Meetings: Not on file  . Marital Status: Not on file    Allergies: No Known Allergies  Metabolic Disorder Labs: Lab Results  Component Value Date   HGBA1C 6.0 (A) 03/25/2019   MPG 120 (H) 11/17/2014   No results found for: PROLACTIN Lab Results  Component Value Date   CHOL 177 05/24/2019   TRIG 74 05/24/2019   HDL 50 05/24/2019   CHOLHDL 3.5 05/24/2019   VLDL 26 03/15/2016   LDLCALC 113 (H) 05/24/2019   LDLCALC 99 03/15/2016   Lab Results  Component Value Date   TSH 0.572 05/24/2019   TSH 0.34 (L) 03/15/2016    Therapeutic Level Labs: No results found for: LITHIUM No results found for: VALPROATE No components found for:  CBMZ  Current Medications: Current Outpatient Medications  Medication Sig Dispense Refill  . escitalopram (LEXAPRO) 10 MG tablet Take 1 tablet (10 mg total) by mouth daily. 30 tablet 1  . famotidine (PEPCID) 20 MG tablet TAKE 1 TABLET BY MOUTH TWICE A DAY 60 tablet 1  . hydrOXYzine (ATARAX/VISTARIL) 10 MG tablet Take 1 tablet (10 mg total) by mouth 3 (three) times daily as needed for anxiety. 90 tablet 1  . lamoTRIgine (LAMICTAL) 25 MG tablet TAKE ONE TABLET DAILY FOR 2 WEEKS THEN TAKE 2 TABLETS DAILY FOR 2 WEEKS THEN TAKE 3 TABLETS DAILY 90 tablet 1  . Vitamin D, Ergocalciferol, (DRISDOL) 1.25 MG (50000 UNIT) CAPS capsule Take 1 capsule (50,000 Units total) by mouth every 7 (seven) days. 12 capsule 2   No current facility-administered medications for this visit.      Psychiatric Specialty Exam: Review of Systems  There were no vitals taken for this visit.There is no height or weight on file to calculate BMI.  General Appearance: Fairly Groomed  Eye Contact:  Good  Speech:  Clear and Coherent and Normal Rate  Volume:  Normal  Mood:  Euthymic  Affect:  Congruent  Thought Process:  Goal Directed and Descriptions of Associations: Intact  Orientation:  Full (Time, Place, and Person)  Thought Content: Logical   Suicidal Thoughts:  No  Homicidal Thoughts:  No  Memory:  Immediate;   Good Recent;   Good  Judgement:  Fair  Insight:  Fair  Psychomotor Activity:  Normal  Concentration:  Concentration: Good and Attention Span: Good  Recall:  Good  Fund of Knowledge: Good  Language: Good  Akathisia:  Negative  Handed:  Right  AIMS (if indicated): not done  Assets:  Communication Skills Desire for Improvement Financial Resources/Insurance Housing  ADL's:  Intact  Cognition: WNL  Sleep:  Fair   Screenings: GAD-7     Counselor from 12/13/2019 in [facility]  Total GAD-7 Score 16    PHQ2-9     Counselor from 12/13/2019 in [facility] Office Visit from 08/01/2019 in [facility] Office Visit from 05/24/2019 in [facility] Office Visit from 03/05/2018 in [facility] Office Visit from 02/05/2018 in [facility]  PHQ-2 Total Score 3 4 0 0 1  PHQ-9 Total Score 15 18 -- -- --       Assessment and Plan: Patient appears to be doing fairly well with the help of the medication regimen however reported that she ran out of the medication Lexapro few days ago although she had a refill waiting for her in the pharmacy.  Patient was encouraged to pick up the prescriptions.  1. Bipolar 2 disorder, major depressive episode ([name]) - escitalopram (LEXAPRO) 10 MG tablet; Take 1 tablet (10 mg total) by mouth daily.  Dispense: 30  tablet; Refill: 1 - lamoTRIgine (LAMICTAL) 25 MG tablet; Take 3 tablets daily  Dispense: 90 tablet; Refill: 1  2. Anxiety - escitalopram (LEXAPRO) 10 MG tablet; Take 1 tablet (10 mg total) by mouth daily.  Dispense: 30 tablet; Refill: 1 - hydrOXYzine (ATARAX/VISTARIL) 10 MG tablet; Take 1 tablet (10 mg total) by mouth 3 (three) times daily as needed for anxiety.  Dispense: 90 tablet; Refill: 1   Continue same regimen. Continue therapy with Ms. Idalia Needle. F/up in  2 months.  Zena Amos, MD 01/16/2020, 1:41 PM

## 2020-02-08 ENCOUNTER — Other Ambulatory Visit (HOSPITAL_COMMUNITY): Payer: Self-pay | Admitting: Psychiatry

## 2020-02-08 DIAGNOSIS — F419 Anxiety disorder, unspecified: Secondary | ICD-10-CM

## 2020-02-08 DIAGNOSIS — F3181 Bipolar II disorder: Secondary | ICD-10-CM

## 2020-02-11 ENCOUNTER — Ambulatory Visit (HOSPITAL_COMMUNITY): Payer: 59 | Admitting: Clinical

## 2020-02-24 ENCOUNTER — Encounter (HOSPITAL_COMMUNITY): Payer: Self-pay | Admitting: Psychiatry

## 2020-02-24 ENCOUNTER — Telehealth (HOSPITAL_COMMUNITY): Payer: Self-pay | Admitting: *Deleted

## 2020-02-24 NOTE — Telephone Encounter (Signed)
VM left on writers phone to give a message to Dr Evelene Croon re a need to get a release to return to work. Had started her care and the intial release was done per [facility], which she is no longer a patient with them. States she had discussed situation with Dr Evelene Croon. She states she is ready to return to work, but the release now needs to come from Dr Evelene Croon not Toma Copier. Will let the provider know.

## 2020-02-24 NOTE — Telephone Encounter (Signed)
Letter issued

## 2020-02-24 NOTE — Telephone Encounter (Signed)
I am unfamiliar with a form, but she says she is ready to go back to work now, and asked for a letter since there apparrantly was a form for taking her out of work but [employer] do it because she is not a patient there any longer.

## 2020-02-24 NOTE — Telephone Encounter (Signed)
Is there a form that needs to be filled for that ? What date does she want to return back to work ?

## 2020-02-25 ENCOUNTER — Other Ambulatory Visit: Payer: Self-pay

## 2020-02-25 ENCOUNTER — Ambulatory Visit (HOSPITAL_COMMUNITY): Payer: 59 | Admitting: Clinical

## 2020-02-26 ENCOUNTER — Ambulatory Visit: Payer: Self-pay | Admitting: Nurse Practitioner

## 2020-03-11 ENCOUNTER — Encounter (HOSPITAL_COMMUNITY): Payer: Self-pay | Admitting: Psychiatry

## 2020-03-11 ENCOUNTER — Telehealth (INDEPENDENT_AMBULATORY_CARE_PROVIDER_SITE_OTHER): Payer: No Payment, Other | Admitting: Psychiatry

## 2020-03-11 ENCOUNTER — Other Ambulatory Visit: Payer: Self-pay

## 2020-03-11 ENCOUNTER — Other Ambulatory Visit (HOSPITAL_COMMUNITY): Payer: Self-pay | Admitting: Psychiatry

## 2020-03-11 DIAGNOSIS — F3181 Bipolar II disorder: Secondary | ICD-10-CM

## 2020-03-11 DIAGNOSIS — F419 Anxiety disorder, unspecified: Secondary | ICD-10-CM

## 2020-03-11 MED ORDER — LAMOTRIGINE 25 MG PO TABS: ORAL_TABLET | ORAL | 0 refills | Status: DC

## 2020-03-11 MED ORDER — HYDROXYZINE HCL 10 MG PO TABS: 10.0000 mg | ORAL_TABLET | Freq: Three times a day (TID) | ORAL | 0 refills | Status: DC | PRN

## 2020-03-11 MED ORDER — ESCITALOPRAM OXALATE 10 MG PO TABS: 10.0000 mg | ORAL_TABLET | Freq: Every day | ORAL | 0 refills | Status: DC

## 2020-03-11 NOTE — Progress Notes (Signed)
BH MD/PA/NP OP Progress Note  Virtual Visit via Video Note  I connected with Jade [name] on 03/11/20 at  2:40 PM EST by a video enabled telemedicine application and verified that I am speaking with the correct person using two identifiers.  Location: Patient: Home Provider: Clinic   I discussed the limitations of evaluation and management by telemedicine and the availability of in person appointments. The patient expressed understanding and agreed to proceed.  I provided 17 minutes of non-face-to-face time during this encounter.     03/11/2020 2:46 PM Jade [name]  MRN:  [id number]  Chief Complaint: " I am doing okay, I think I went back to work too early."  HPI: Patient was seen while at work. She reported that she feels fine however she is somewhat regretful of going back to work. Patient contacted the clinic a few weeks ago and had requested for a letter stating that she was cleared to return back to work as soon as possible. Patient stated that when she returned back to work it was a very busy time. She stated that she knows that her team and her supervisor were appreciative of her returning back to work at that time. However things get quite hectic at times which makes her feel very anxious and stressed. She is trying her best not to feel overwhelmed. She stated that things are always busy during this time of the year and she is hopeful that things will slow down by January. Writer suggested that she may consider cutting down her hours and maybe doing a part-time job instead of a full-time job. Patient stated that she has thought about that and she may bring this up in January depending on how things go. She stated that in the past the company has been accommodative of people doing part-time positions. She stated that for now she is trying to tough it out and is just trying to get this month over with.  She has been seeing Ms. Paige regularly.  Visit Diagnosis:    ICD-10-CM    1. Bipolar 2 disorder, major depressive episode ([name])  F31.81   2. Anxiety  F41.9     Past Psychiatric History: Bipolar 2 d/o, anxiety  Past Medical History:  Past Medical History:  Diagnosis Date  . Abnormal Pap smear of cervix   . Anxiety   . Depression   . Vitamin D deficiency 04/2019    Past Surgical History:  Procedure Laterality Date  . CERVICAL BIOPSY  W/ LOOP ELECTRODE EXCISION  03-2013  . CESAREAN SECTION     x 1  . COLPOSCOPY    . TUBAL LIGATION      Family Psychiatric History: Mom- depression, anxiety  Family History:  Family History  Problem Relation Age of Onset  . Hypertension Mother   . Diabetes Mother   . Hyperlipidemia Brother   . Pancreatic cancer Maternal Grandmother   . Diabetes Maternal Grandmother     Social History:  Social History   Socioeconomic History  . Marital status: Single    Spouse name: Not on file  . Number of children: Not on file  . Years of education: Not on file  . Highest education level: Not on file  Occupational History  . Not on file  Tobacco Use  . Smoking status: Never Smoker  . Smokeless tobacco: Never Used  Vaping Use  . Vaping Use: Never used  Substance and Sexual Activity  . Alcohol use: No    Comment:  socially   . Drug use: No  . Sexual activity: Not Currently    Birth control/protection: Surgical  Other Topics Concern  . Not on file  Social History Narrative   Marital status: single; dating seriously x 7 years.      Children:  2 children (14, 6)      Lives: with two sons      Employment:  Conservation officer, nature at [employer]; first shift for 8 years.      Tobacco: none      Alcohol:  4-6 drinks per month      Drugs: none      Exercise:  Sporadic; twice weekly; walking      Seatbelt: 100%      Guns: none      Sexual activity:  Total partners = 6; history of Chlamydia in 2002; last STD screening 2016; males only   Social Determinants of Health   Financial Resource Strain: Not on file  Food Insecurity: Not on file   Transportation Needs: Not on file  Physical Activity: Not on file  Stress: Not on file  Social Connections: Not on file    Allergies: No Known Allergies  Metabolic Disorder Labs: Lab Results  Component Value Date   HGBA1C 6.0 (A) 03/25/2019   MPG 120 (H) 11/17/2014   No results found for: PROLACTIN Lab Results  Component Value Date   CHOL 177 05/24/2019   TRIG 74 05/24/2019   HDL 50 05/24/2019   CHOLHDL 3.5 05/24/2019   VLDL 26 03/15/2016   LDLCALC 113 (H) 05/24/2019   LDLCALC 99 03/15/2016   Lab Results  Component Value Date   TSH 0.572 05/24/2019   TSH 0.34 (L) 03/15/2016    Therapeutic Level Labs: No results found for: LITHIUM No results found for: VALPROATE No components found for:  CBMZ  Current Medications: Current Outpatient Medications  Medication Sig Dispense Refill  . escitalopram (LEXAPRO) 10 MG tablet Take 1 tablet (10 mg total) by mouth daily. 30 tablet 1  . famotidine (PEPCID) 20 MG tablet TAKE 1 TABLET BY MOUTH TWICE A DAY 60 tablet 1  . hydrOXYzine (ATARAX/VISTARIL) 10 MG tablet TAKE 1 TABLET (10 MG TOTAL) BY MOUTH 3 (THREE) TIMES DAILY AS NEEDED FOR ANXIETY. 270 tablet 1  . lamoTRIgine (LAMICTAL) 25 MG tablet TAKE ONE TABLET DAILY FOR 2 WEEKS THEN TAKE 2 TABLETS DAILY FOR 2 WEEKS THEN TAKE 3 TABLETS DAILY 90 tablet 1  . Vitamin D, Ergocalciferol, (DRISDOL) 1.25 MG (50000 UNIT) CAPS capsule Take 1 capsule (50,000 Units total) by mouth every 7 (seven) days. 12 capsule 2   No current facility-administered medications for this visit.     Psychiatric Specialty Exam: Review of Systems  There were no vitals taken for this visit.There is no height or weight on file to calculate BMI.  General Appearance: Fairly Groomed  Eye Contact:  Good  Speech:  Clear and Coherent and Normal Rate  Volume:  Normal  Mood: Slightly Anxious  Affect:  Congruent  Thought Process:  Goal Directed and Descriptions of Associations: Intact  Orientation:  Full (Time, Place,  and Person)  Thought Content: Logical   Suicidal Thoughts:  No  Homicidal Thoughts:  No  Memory:  Immediate;   Good Recent;   Good  Judgement:  Fair  Insight:  Fair  Psychomotor Activity:  Normal  Concentration:  Concentration: Good and Attention Span: Good  Recall:  Good  Fund of Knowledge: Good  Language: Good  Akathisia:  Negative  Handed:  Right  AIMS (if indicated): not done  Assets:  Communication Skills Desire for Improvement Financial Resources/Insurance Housing  ADL's:  Intact  Cognition: WNL  Sleep:  Fair   Screenings: GAD-7   Advertising copywriter from 12/13/2019 in [facility]  Total GAD-7 Score 16    PHQ2-9   Flowsheet Row Counselor from 12/13/2019 in [facility] Office Visit from 08/01/2019 in [facility] Office Visit from 05/24/2019 in [facility] Office Visit from 03/05/2018 in [facility] Office Visit from 02/05/2018 in [facility]  PHQ-2 Total Score 3 4 0 0 1  PHQ-9 Total Score 15 18 -- -- --       Assessment and Plan: Patient reported that she feels she may have returned back to work too soon then she should not. However she is trying to tough it out and do her best. She is trying hard not to get overwhelmed like she was a few months ago. We will keep the medication regimen the same way it is.  1. Bipolar 2 disorder, major depressive episode ([name])  - lamoTRIgine (LAMICTAL) 25 MG tablet; Take 3 tablets daily  Dispense: 270 tablet; Refill: 0 - escitalopram (LEXAPRO) 10 MG tablet; Take 1 tablet (10 mg total) by mouth daily.  Dispense: 90 tablet; Refill: 0  2. Anxiety  - hydrOXYzine (ATARAX/VISTARIL) 10 MG tablet; Take 1 tablet (10 mg total) by mouth 3 (three) times daily as needed for anxiety.  Dispense: 270 tablet; Refill: 0 - escitalopram (LEXAPRO) 10 MG tablet; Take 1 tablet (10 mg total) by mouth daily.  Dispense: 90  tablet; Refill: 0  Continue same regimen. Continue therapy with Ms. Idalia Needle. F/up in  2 months.  Zena Amos, MD 03/11/2020, 2:46 PM

## 2020-06-08 ENCOUNTER — Other Ambulatory Visit (HOSPITAL_COMMUNITY): Payer: Self-pay | Admitting: Psychiatry

## 2020-06-08 DIAGNOSIS — F419 Anxiety disorder, unspecified: Secondary | ICD-10-CM

## 2020-06-08 DIAGNOSIS — F3181 Bipolar II disorder: Secondary | ICD-10-CM

## 2020-06-22 ENCOUNTER — Other Ambulatory Visit (HOSPITAL_COMMUNITY): Payer: Self-pay | Admitting: Psychiatry

## 2020-06-22 DIAGNOSIS — F419 Anxiety disorder, unspecified: Secondary | ICD-10-CM

## 2020-06-22 DIAGNOSIS — F3181 Bipolar II disorder: Secondary | ICD-10-CM

## 2020-08-03 ENCOUNTER — Telehealth (INDEPENDENT_AMBULATORY_CARE_PROVIDER_SITE_OTHER): Payer: No Payment, Other | Admitting: Psychiatry

## 2020-08-03 ENCOUNTER — Other Ambulatory Visit: Payer: Self-pay

## 2020-08-03 ENCOUNTER — Encounter (HOSPITAL_COMMUNITY): Payer: Self-pay | Admitting: Psychiatry

## 2020-08-03 DIAGNOSIS — F3181 Bipolar II disorder: Secondary | ICD-10-CM | POA: Diagnosis not present

## 2020-08-03 DIAGNOSIS — F419 Anxiety disorder, unspecified: Secondary | ICD-10-CM | POA: Diagnosis not present

## 2020-08-03 MED ORDER — ESCITALOPRAM OXALATE 10 MG PO TABS: 10.0000 mg | ORAL_TABLET | Freq: Every day | ORAL | 0 refills | Status: DC

## 2020-08-03 MED ORDER — LAMOTRIGINE 25 MG PO TABS
ORAL_TABLET | ORAL | 0 refills | Status: DC
Start: 2020-08-03 — End: 2020-09-03

## 2020-08-03 MED ORDER — HYDROXYZINE HCL 10 MG PO TABS: 10.0000 mg | ORAL_TABLET | Freq: Three times a day (TID) | ORAL | 0 refills | Status: DC | PRN

## 2020-08-03 NOTE — Progress Notes (Signed)
BH MD/PA/NP OP Progress Note  Virtual Visit via Video Note  I connected with Jade [name] on 08/03/20 at 11:20 AM EDT by a video enabled telemedicine application and verified that I am speaking with the correct person using two identifiers.  Location: Patient: Home Provider: Clinic   I discussed the limitations of evaluation and management by telemedicine and the availability of in person appointments. The patient expressed understanding and agreed to proceed.  I provided 15 minutes of non-face-to-face time during this encounter.    08/03/2020 11:34 AM Jade [name]  MRN:  [id number]  Chief Complaint: " The past 2-3 weeks have been really rough."  HPI: Patient reported last to 3 weeks and really rough because of her work.  She stated that her work has been really is stressing her out and she feels really overwhelmed once most of the days.  She stated that at work she has to do with a lot of people due to making frequent phone calls.  She stated that this becomes really overwhelming and she feels exhausted and mentally tired.  She has talked to her management but they are not really helpful with that. 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 She stated that looking for a new job means spending a lot of time devoted to that and also taking some time off from work to interview for positions.  She stated that her medications have helped to manage her mood well and she does not think she needs any adjustments or changes in the medicines for now.  Writer advised her to start looking for something different given the fact that her current work is very stressful and she is not able to perform well without undue pressure.  Writer suggested that she sets aside sometime on a daily basis that she can devote to looking for a job.  Patient agreed with the writer's suggestion.  Visit  Diagnosis:    ICD-10-CM   1. Bipolar 2 disorder, major depressive episode ([name])  F31.81 escitalopram (LEXAPRO) 10 MG tablet    lamoTRIgine (LAMICTAL) 25 MG tablet  2. Anxiety  F41.9 escitalopram (LEXAPRO) 10 MG tablet    hydrOXYzine (ATARAX/VISTARIL) 10 MG tablet    Past Psychiatric History: Bipolar 2 d/o, anxiety  Past Medical History:  Past Medical History:  Diagnosis Date  . Abnormal Pap smear of cervix   . Anxiety   . Depression   . Vitamin D deficiency 04/2019    Past Surgical History:  Procedure Laterality Date  . CERVICAL BIOPSY  W/ LOOP ELECTRODE EXCISION  03-2013  . CESAREAN SECTION     x 1  . COLPOSCOPY    . TUBAL LIGATION      Family Psychiatric History: Mom- depression, anxiety  Family History:  Family History  Problem Relation Age of Onset  . Hypertension Mother   . Diabetes Mother   . Hyperlipidemia Brother   . Pancreatic cancer Maternal Grandmother   . Diabetes Maternal Grandmother     Social History:  Social History   Socioeconomic History  . Marital status: Single    Spouse name: Not on file  . Number of children: Not on file  . Years of education: Not on file  . Highest education level: Not on file  Occupational History  . Not on file  Tobacco Use  . Smoking status: Never Smoker  . Smokeless tobacco:  Never Used  Vaping Use  . Vaping Use: Never used  Substance and Sexual Activity  . Alcohol use: No    Comment: socially   . Drug use: No  . Sexual activity: Not Currently    Birth control/protection: Surgical  Other Topics Concern  . Not on file  Social History Narrative   Marital status: single; dating seriously x 7 years.      Children:  2 children (14, 6)      Lives: with two sons      Employment:  Conservation officer, nature at [employer]; first shift for 8 years.      Tobacco: none      Alcohol:  4-6 drinks per month      Drugs: none      Exercise:  Sporadic; twice weekly; walking      Seatbelt: 100%      Guns: none      Sexual activity:  Total  partners = 6; history of Chlamydia in 2002; last STD screening 2016; males only   Social Determinants of Health   Financial Resource Strain: Not on file  Food Insecurity: Not on file  Transportation Needs: Not on file  Physical Activity: Not on file  Stress: Not on file  Social Connections: Not on file    Allergies: No Known Allergies  Metabolic Disorder Labs: Lab Results  Component Value Date   HGBA1C 6.0 (A) 03/25/2019   MPG 120 (H) 11/17/2014   No results found for: PROLACTIN Lab Results  Component Value Date   CHOL 177 05/24/2019   TRIG 74 05/24/2019   HDL 50 05/24/2019   CHOLHDL 3.5 05/24/2019   VLDL 26 03/15/2016   LDLCALC 113 (H) 05/24/2019   LDLCALC 99 03/15/2016   Lab Results  Component Value Date   TSH 0.572 05/24/2019   TSH 0.34 (L) 03/15/2016    Therapeutic Level Labs: No results found for: LITHIUM No results found for: VALPROATE No components found for:  CBMZ  Current Medications: Current Outpatient Medications  Medication Sig Dispense Refill  . escitalopram (LEXAPRO) 10 MG tablet Take 1 tablet (10 mg total) by mouth daily. 90 tablet 0  . famotidine (PEPCID) 20 MG tablet TAKE 1 TABLET BY MOUTH TWICE A DAY 60 tablet 1  . hydrOXYzine (ATARAX/VISTARIL) 10 MG tablet Take 1 tablet (10 mg total) by mouth 3 (three) times daily as needed for anxiety. 270 tablet 0  . lamoTRIgine (LAMICTAL) 25 MG tablet Take 3 tablets daily 270 tablet 0  . Vitamin D, Ergocalciferol, (DRISDOL) 1.25 MG (50000 UNIT) CAPS capsule Take 1 capsule (50,000 Units total) by mouth every 7 (seven) days. 12 capsule 2   No current facility-administered medications for this visit.     Psychiatric Specialty Exam: Review of Systems  There were no vitals taken for this visit.There is no height or weight on file to calculate BMI.  General Appearance: Fairly Groomed  Eye Contact:  Good  Speech:  Clear and Coherent and Normal Rate  Volume:  Normal  Mood:  Euthymic  Affect:  Congruent   Thought Process:  Goal Directed and Descriptions of Associations: Intact  Orientation:  Full (Time, Place, and Person)  Thought Content: Logical   Suicidal Thoughts:  No  Homicidal Thoughts:  No  Memory:  Immediate;   Good Recent;   Good  Judgement:  Fair  Insight:  Fair  Psychomotor Activity:  Normal  Concentration:  Concentration: Good and Attention Span: Good  Recall:  Good  Fund of Knowledge: Good  Language: Good  Akathisia:  Negative  Handed:  Right  AIMS (if indicated): not done  Assets:  Communication Skills Desire for Improvement Financial Resources/Insurance Housing  ADL's:  Intact  Cognition: WNL  Sleep:  Fair   Screenings: GAD-7   Advertising copywriter from 12/13/2019 in [facility]  Total GAD-7 Score 16    PHQ2-9   Flowsheet Row Counselor from 12/13/2019 in [facility] Visit from 08/01/2019 in [facility] Visit from 05/24/[facility] Office Visit from 03/05/2018 in [facility] Office Visit from 02/05/2018 in [facility]  PHQ-2 Total Score 3 4 0 0 1  PHQ-9 Total Score 15 18 -- -- --       Assessment and Plan: Patient feels overwhelmed by her current job requirements and reported that she had a rough time over the last couple of weeks.  She is open to the suggestion of trying to look for other job positions which do not require many interactions with other people.  She intends to continue the same regimen for now as she finds it helpful.  1. Bipolar 2 disorder, major depressive episode ([name])  - lamoTRIgine (LAMICTAL) 25 MG tablet; Take 3 tablets daily  Dispense: 270 tablet; Refill: 0 - escitalopram (LEXAPRO) 10 MG tablet; Take 1 tablet (10 mg total) by mouth daily.  Dispense: 90 tablet; Refill: 0  2. Anxiety  - hydrOXYzine (ATARAX/VISTARIL) 10 MG tablet; Take 1 tablet (10 mg total) by mouth 3 (three)  times daily as needed for anxiety.  Dispense: 270 tablet; Refill: 0 - escitalopram (LEXAPRO) 10 MG tablet; Take 1 tablet (10 mg total) by mouth daily.  Dispense: 90 tablet; Refill: 0  Continue same regimen. Continue therapy with Ms. Idalia Needle. F/up in 2 months. Patient was informed that her care is being transferred to a different provider as the writer is leaving the office.  She verbalized her understanding and wished the writer best.  Zena Amos, MD 08/03/2020, 11:34 AM

## 2020-08-31 ENCOUNTER — Encounter (HOSPITAL_COMMUNITY): Payer: Self-pay | Admitting: Psychiatry

## 2020-08-31 ENCOUNTER — Telehealth (HOSPITAL_COMMUNITY): Payer: Self-pay | Admitting: Psychiatry

## 2020-08-31 ENCOUNTER — Ambulatory Visit (HOSPITAL_COMMUNITY)
Admission: EM | Admit: 2020-08-31 | Discharge: 2020-08-31 | Disposition: A | Discharge status: Other Acute Inpatient Hospital | Payer: No Payment, Other | Attending: Psychiatry | Admitting: Psychiatry

## 2020-08-31 ENCOUNTER — Other Ambulatory Visit: Payer: Self-pay

## 2020-08-31 ENCOUNTER — Inpatient Hospital Stay (HOSPITAL_COMMUNITY)
Admission: AD | Admit: 2020-08-31 | Discharge: 2020-09-03 | DRG: 885 | Disposition: A | Discharge status: Home / Self Care | Payer: No Typology Code available for payment source | Source: Intra-hospital | Attending: Psychiatry | Admitting: Psychiatry

## 2020-08-31 DIAGNOSIS — F4310 Post-traumatic stress disorder, unspecified: Secondary | ICD-10-CM | POA: Diagnosis present

## 2020-08-31 DIAGNOSIS — Z818 Family history of other mental and behavioral disorders: Secondary | ICD-10-CM | POA: Diagnosis not present

## 2020-08-31 DIAGNOSIS — Z20822 Contact with and (suspected) exposure to covid-19: Secondary | ICD-10-CM | POA: Insufficient documentation

## 2020-08-31 DIAGNOSIS — F319 Bipolar disorder, unspecified: Secondary | ICD-10-CM | POA: Diagnosis present

## 2020-08-31 DIAGNOSIS — F3130 Bipolar disorder, current episode depressed, mild or moderate severity, unspecified: Principal | ICD-10-CM | POA: Diagnosis present

## 2020-08-31 DIAGNOSIS — F419 Anxiety disorder, unspecified: Secondary | ICD-10-CM | POA: Diagnosis not present

## 2020-08-31 DIAGNOSIS — G4700 Insomnia, unspecified: Secondary | ICD-10-CM | POA: Diagnosis present

## 2020-08-31 DIAGNOSIS — F3181 Bipolar II disorder: Secondary | ICD-10-CM | POA: Insufficient documentation

## 2020-08-31 DIAGNOSIS — R45851 Suicidal ideations: Secondary | ICD-10-CM | POA: Diagnosis present

## 2020-08-31 DIAGNOSIS — Z79899 Other long term (current) drug therapy: Secondary | ICD-10-CM

## 2020-08-31 LAB — POC SARS CORONAVIRUS 2 AG -  ED: SARS Coronavirus 2 Ag: NEGATIVE

## 2020-08-31 LAB — POCT URINE DRUG SCREEN - MANUAL ENTRY (I-SCREEN)
POC Amphetamine UR: NOT DETECTED
POC Buprenorphine (BUP): NOT DETECTED
POC Cocaine UR: NOT DETECTED
POC Marijuana UR: NOT DETECTED
POC Methadone UR: NOT DETECTED
POC Methamphetamine UR: NOT DETECTED
POC Morphine: NOT DETECTED
POC Oxazepam (BZO): NOT DETECTED
POC Oxycodone UR: NOT DETECTED
POC Secobarbital (BAR): NOT DETECTED

## 2020-08-31 LAB — POC SARS CORONAVIRUS 2 AG: SARSCOV2ONAVIRUS 2 AG: NEGATIVE

## 2020-08-31 LAB — COMPREHENSIVE METABOLIC PANEL
ALT: 21 U/L (ref 0–44)
AST: 19 U/L (ref 15–41)
Albumin: 3.6 g/dL (ref 3.5–5.0)
Alkaline Phosphatase: 63 U/L (ref 38–126)
Anion gap: 9 (ref 5–15)
BUN: 16 mg/dL (ref 6–20)
CO2: 27 mmol/L (ref 22–32)
Calcium: 9.1 mg/dL (ref 8.9–10.3)
Chloride: 103 mmol/L (ref 98–111)
Creatinine, Ser: 0.8 mg/dL (ref 0.44–1.00)
GFR, Estimated: >60 mL/min (ref >60)
Glucose, Bld: 101 mg/dL — ABNORMAL HIGH (ref 70–99)
Potassium: 4.2 mmol/L (ref 3.5–5.1)
Sodium: 139 mmol/L (ref 135–145)
Total Bilirubin: 0.5 mg/dL (ref 0.3–1.2)
Total Protein: 6.2 g/dL — ABNORMAL LOW (ref 6.5–8.1)

## 2020-08-31 LAB — CBC WITH DIFFERENTIAL/PLATELET
Abs Immature Granulocytes: 0.02 10*3/uL (ref 0.00–0.07)
Basophils Absolute: 0 10*3/uL (ref 0.0–0.1)
Basophils Relative: 0 %
Eosinophils Absolute: 0.2 10*3/uL (ref 0.0–0.5)
Eosinophils Relative: 3 %
HCT: 42.9 % (ref 36.0–46.0)
Hemoglobin: 13.3 g/dL (ref 12.0–15.0)
Immature Granulocytes: 0 %
Lymphocytes Relative: 38 %
Lymphs Abs: 2.2 10*3/uL (ref 0.7–4.0)
MCH: 28.7 pg (ref 26.0–34.0)
MCHC: 31 g/dL (ref 30.0–36.0)
MCV: 92.5 fL (ref 80.0–100.0)
Monocytes Absolute: 0.5 10*3/uL (ref 0.1–1.0)
Monocytes Relative: 9 %
Neutro Abs: 2.9 10*3/uL (ref 1.7–7.7)
Neutrophils Relative %: 50 %
Platelets: 273 10*3/uL (ref 150–400)
RBC: 4.64 MIL/uL (ref 3.87–5.11)
RDW: 13.7 % (ref 11.5–15.5)
WBC: 5.9 10*3/uL (ref 4.0–10.5)
nRBC: 0 % (ref 0.0–0.2)

## 2020-08-31 LAB — ETHANOL: Alcohol, Ethyl (B): <10 mg/dL (ref <10)

## 2020-08-31 LAB — URINALYSIS, ROUTINE W REFLEX MICROSCOPIC
Bilirubin Urine: NEGATIVE
Glucose, UA: NEGATIVE mg/dL
Hgb urine dipstick: NEGATIVE
Ketones, ur: NEGATIVE mg/dL
Leukocytes,Ua: NEGATIVE
Nitrite: NEGATIVE
Protein, ur: NEGATIVE mg/dL
Specific Gravity, Urine: 1.025 (ref 1.005–1.030)
pH: 5 (ref 5.0–8.0)

## 2020-08-31 LAB — TSH: TSH: 1.315 u[IU]/mL (ref 0.350–4.500)

## 2020-08-31 LAB — POCT PREGNANCY, URINE: Preg Test, Ur: NEGATIVE

## 2020-08-31 LAB — LIPID PANEL
Cholesterol: 195 mg/dL (ref 0–200)
HDL: 49 mg/dL (ref >40)
LDL Cholesterol: 128 mg/dL — ABNORMAL HIGH (ref 0–99)
Total CHOL/HDL Ratio: 4 RATIO
Triglycerides: 92 mg/dL (ref <150)
VLDL: 18 mg/dL (ref 0–40)

## 2020-08-31 LAB — PREGNANCY, URINE: Preg Test, Ur: NEGATIVE

## 2020-08-31 LAB — RESP PANEL BY RT-PCR (FLU A&B, COVID) ARPGX2
Influenza A by PCR: NEGATIVE
Influenza B by PCR: NEGATIVE
SARS Coronavirus 2 by RT PCR: NEGATIVE

## 2020-08-31 MED ORDER — LAMOTRIGINE 25 MG PO TABS
75.0000 mg | ORAL_TABLET | Freq: Every day | ORAL | Status: DC
  Filled 2020-08-31: qty 3

## 2020-08-31 MED ORDER — MAGNESIUM HYDROXIDE 400 MG/5ML PO SUSP: 30.0000 mL | Freq: Every day | ORAL | Status: DC | PRN

## 2020-08-31 MED ORDER — ACETAMINOPHEN 325 MG PO TABS: 650.0000 mg | ORAL_TABLET | Freq: Four times a day (QID) | ORAL | Status: DC | PRN

## 2020-08-31 MED ORDER — ALUM & MAG HYDROXIDE-SIMETH 200-200-20 MG/5ML PO SUSP: 30.0000 mL | ORAL | Status: DC | PRN

## 2020-08-31 MED ORDER — ACETAMINOPHEN 325 MG PO TABS
650.0000 mg | ORAL_TABLET | Freq: Four times a day (QID) | ORAL | Status: DC | PRN
  Administered 2020-09-01: 650 mg via ORAL
  Filled 2020-08-31: qty 2

## 2020-08-31 MED ORDER — ESCITALOPRAM OXALATE 10 MG PO TABS
10.0000 mg | ORAL_TABLET | Freq: Every day | ORAL | Status: DC
  Administered 2020-09-01: 10 mg via ORAL
  Filled 2020-08-31 (×2): qty 1

## 2020-08-31 MED ORDER — LAMOTRIGINE 25 MG PO TABS
75.0000 mg | ORAL_TABLET | Freq: Every day | ORAL | Status: DC
  Administered 2020-09-01: 75 mg via ORAL
  Filled 2020-08-31 (×2): qty 3

## 2020-08-31 MED ORDER — HYDROXYZINE HCL 10 MG PO TABS: 10.0000 mg | ORAL_TABLET | Freq: Three times a day (TID) | ORAL | Status: DC | PRN

## 2020-08-31 MED ORDER — TRAZODONE HCL 50 MG PO TABS
50.0000 mg | ORAL_TABLET | Freq: Every evening | ORAL | Status: DC | PRN
  Administered 2020-08-31 – 2020-09-01 (×2): 50 mg via ORAL
  Filled 2020-08-31 (×2): qty 1
  Filled 2020-08-31: qty 7
  Filled 2020-08-31: qty 1

## 2020-08-31 MED ORDER — ESCITALOPRAM OXALATE 10 MG PO TABS
10.0000 mg | ORAL_TABLET | Freq: Every day | ORAL | Status: DC
  Filled 2020-08-31: qty 1

## 2020-08-31 MED ORDER — HYDROXYZINE HCL 10 MG PO TABS
10.0000 mg | ORAL_TABLET | Freq: Three times a day (TID) | ORAL | Status: DC | PRN
  Administered 2020-08-31 – 2020-09-02 (×4): 10 mg via ORAL
  Filled 2020-08-31 (×3): qty 1
  Filled 2020-08-31: qty 10
  Filled 2020-08-31: qty 1

## 2020-08-31 NOTE — ED Provider Notes (Signed)
FBC/OBS ASAP Discharge Summary  Date and Time: 08/31/2020 4:01 PM  Name: Jade [name]  MRN:  [id number]   Discharge Diagnoses:  Final diagnoses:  Bipolar 2 disorder, major depressive episode ([name])    Subjective: patient presented with SI and unale to contract for safety,see H&P for additional details  Stay Summary: patient transferred to [facility] the same day  Total Time spent with patient: 30 minutes  Past Psychiatric History: see H&P Past Medical History:  Past Medical History:  Diagnosis Date  . Abnormal Pap smear of cervix   . Anxiety   . Depression   . Vitamin D deficiency 04/2019    Past Surgical History:  Procedure Laterality Date  . CERVICAL BIOPSY  W/ LOOP ELECTRODE EXCISION  03-2013  . CESAREAN SECTION     x 1  . COLPOSCOPY    . TUBAL LIGATION     Family History:  Family History  Problem Relation Age of Onset  . Hypertension Mother   . Diabetes Mother   . Hyperlipidemia Brother   . Pancreatic cancer Maternal Grandmother   . Diabetes Maternal Grandmother    Family Psychiatric History: see H&P Social History:  Social History   Substance and Sexual Activity  Alcohol Use No   Comment: socially      Social History   Substance and Sexual Activity  Drug Use No    Social History   Socioeconomic History  . Marital status: Single    Spouse name: Not on file  . Number of children: Not on file  . Years of education: Not on file  . Highest education level: Not on file  Occupational History  . Not on file  Tobacco Use  . Smoking status: Never Smoker  . Smokeless tobacco: Never Used  Vaping Use  . Vaping Use: Never used  Substance and Sexual Activity  . Alcohol use: No    Comment: socially   . Drug use: No  . Sexual activity: Not Currently    Birth control/protection: Surgical  Other Topics Concern  . Not on file  Social History Narrative   Marital status: single; dating seriously x 7 years.      Children:  2 children (14, 6)      Lives: with two  sons      Employment:  Conservation officer, nature at [employer]; first shift for 8 years.      Tobacco: none      Alcohol:  4-6 drinks per month      Drugs: none      Exercise:  Sporadic; twice weekly; walking      Seatbelt: 100%      Guns: none      Sexual activity:  Total partners = 6; history of Chlamydia in 2002; last STD screening 2016; males only   Social Determinants of Health   Financial Resource Strain: Not on file  Food Insecurity: Not on file  Transportation Needs: Not on file  Physical Activity: Not on file  Stress: Not on file  Social Connections: Not on file   SDOH:  SDOH Screenings   Alcohol Screen: Not on file  Depression (PHQ2-9): Medium Risk  . PHQ-2 Score: 20  Financial Resource Strain: Not on file  Food Insecurity: Not on file  Housing: Not on file  Physical Activity: Not on file  Social Connections: Not on file  Stress: Not on file  Tobacco Use: Low Risk   . Smoking Tobacco Use: Never Smoker  . Smokeless Tobacco Use: Never Used  Transportation Needs: Not on file    Has this patient used any form of tobacco in the last 30 days? (Cigarettes, Smokeless Tobacco, Cigars, and/or Pipes) Prescription not provided because: n/a  Current Medications:  Current Facility-Administered Medications  Medication Dose Route Frequency Provider Last Rate Last Admin  . acetaminophen (TYLENOL) tablet 650 mg  650 mg Oral Q6H PRN Estella Husk, MD      . alum & mag hydroxide-simeth (MAALOX/MYLANTA) 200-200-20 MG/5ML suspension 30 mL  30 mL Oral Q4H PRN Estella Husk, MD      . escitalopram (LEXAPRO) tablet 10 mg  10 mg Oral Daily Estella Husk, MD      . hydrOXYzine (ATARAX/VISTARIL) tablet 10 mg  10 mg Oral TID PRN Estella Husk, MD      . lamoTRIgine (LAMICTAL) tablet 75 mg  75 mg Oral Daily Estella Husk, MD      . magnesium hydroxide (MILK OF MAGNESIA) suspension 30 mL  30 mL Oral Daily PRN Estella Husk, MD       Current Outpatient Medications   Medication Sig Dispense Refill  . escitalopram (LEXAPRO) 10 MG tablet Take 1 tablet (10 mg total) by mouth daily. 90 tablet 0  . hydrOXYzine (ATARAX/VISTARIL) 10 MG tablet Take 1 tablet (10 mg total) by mouth 3 (three) times daily as needed for anxiety. 270 tablet 0  . lamoTRIgine (LAMICTAL) 25 MG tablet Take 3 tablets daily 270 tablet 0    PTA Medications: (Not in a hospital admission)   Musculoskeletal  Strength & Muscle Tone: within normal limits Gait & Station: normal Patient leans: N/A  Psychiatric Specialty Exam  Presentation  General Appearance: Appropriate for Environment; Casual  Eye Contact:Fair  Speech:Clear and Coherent; Normal Rate  Speech Volume:Normal  Handedness:No data recorded  Mood and Affect  Mood:Depressed; Worthless  Affect:Tearful; Appropriate   Thought Process  Thought Processes:Coherent; Goal Directed; Linear  Descriptions of Associations:Intact  Orientation:Full (Time, Place and Person)  Thought Content:WDL  Diagnosis of Schizophrenia or Schizoaffective disorder in past: No    Hallucinations:Hallucinations: None  Ideas of Reference:None  Suicidal Thoughts:Suicidal Thoughts: Yes, Active SI Active Intent and/or Plan: Without Intent; Without Plan  Homicidal Thoughts:Homicidal Thoughts: No   Sensorium  Memory:Immediate Good; Recent Good; Remote Good  Judgment:Fair  Insight:Fair   Executive Functions  Concentration:Good  Attention Span:Good  Recall:Good  Fund of Knowledge:Good  Language:Good   Psychomotor Activity  Psychomotor Activity:Psychomotor Activity: Normal   Assets  Assets:Communication Skills; Desire for Improvement; Resilience; Physical Health; Vocational/Educational   Sleep  Sleep:Sleep: Fair   Nutritional Assessment (For OBS and FBC admissions only) Has the patient had a weight loss or gain of 10 pounds or more in the last 3 months?: Yes Has the patient had a decrease in food intake/or appetite?:  No Does the patient have dental problems?: No Does the patient have eating habits or behaviors that may be indicators of an eating disorder including binging or inducing vomiting?: No Has the patient recently lost weight without trying?: No Has the patient been eating poorly because of a decreased appetite?: Yes Malnutrition Screening Tool Score: 1    Physical Exam  Physical Exam Constitutional:      Appearance: Normal appearance. She is normal weight.  HENT:     Head: Normocephalic and atraumatic.  Pulmonary:     Effort: Pulmonary effort is normal.  Neurological:     Mental Status: She is alert and oriented to person, place, and time.  Review of Systems  Constitutional: Negative for chills and fever.  HENT: Negative for hearing loss.   Eyes: Negative for discharge and redness.  Respiratory: Negative for cough.   Cardiovascular: Negative for chest pain.  Gastrointestinal: Negative for abdominal pain.  Musculoskeletal: Negative for myalgias.  Neurological: Negative for headaches.  Psychiatric/Behavioral: Positive for depression and suicidal ideas. Negative for hallucinations and substance abuse.  Blood pressure 130/79, pulse 71, temperature 98.6 F (37 C), temperature source Oral, resp. rate 16, SpO2 96 %. There is no height or weight on file to calculate BMI.  Demographic Factors:  NA  Loss Factors: Financial problems/change in socioeconomic status  Historical Factors: Impulsivity  Risk Reduction Factors:   Responsible for children under 11 years of age, Sense of responsibility to family, Employed and Living with another person, especially a relative  Continued Clinical Symptoms:  Depression:   Anhedonia Hopelessness  Cognitive Features That Contribute To Risk:  Thought constriction (tunnel vision)    Suicide Risk:  Severe:  Frequent, intense, and enduring suicidal ideation, specific plan, no subjective intent, but some objective markers of intent (i.e.,  choice of lethal method), the method is accessible, some limited preparatory behavior, evidence of impaired self-control, severe dysphoria/symptomatology, multiple risk factors present, and few if any protective factors, particularly a lack of social support.  Plan Of Care/Follow-up recommendations:  Transfer to b[facility]  Disposition: transfer to  bhh  Estella Husk, MD 08/31/2020, 4:01 PM
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
## 2020-08-31 NOTE — Telephone Encounter (Signed)
Thanks

## 2020-08-31 NOTE — Telephone Encounter (Signed)
Patient called requesting to be soon as soon as possible for a follow-up with provider. States she is "having a really rough day". Writer informed patient of providers next available appt and scheduled. Patient was also informed of outpatient's walk-in hours and [facility] information for 24/7 availability if patient felt that she needed to seek immediate help. Patient was understanding and took next available appointment and other information in case of urgent needs.

## 2020-08-31 NOTE — Progress Notes (Signed)
Pt is admitted to Flex bed 1 due to SI with no plan. Pt is alert and oriented with flat affect. Pt is ambulatory and is oriented to staff and unit. Cooperative with skin assessment. Nourishment offered. Pt denies pain, SI/HI/AVH at this time. Staff will monitor for pt's safety.

## 2020-08-31 NOTE — Progress Notes (Signed)
Jade [name] transferred to [facility] per MD order. Discussed with the patient and all questions fully answered. An After Visit Summary, EMTALA and Med Necessity forms were printed and to be given to the receiving nurse. Report given to [place], [place].  Patient escorted out and transferred via safe transport. [name]  08/31/2020 4:59 PM

## 2020-08-31 NOTE — ED Provider Notes (Signed)
Behavioral Health Admission H&P [facility] & OBS)  Date: 08/31/20 Patient Name: Jade [name] MRN: [id number] Chief Complaint: No chief complaint on file.  Chief Complaint/Presenting Problem: Patient reports worsening depression due to mounting life stressors.  She is feeling hopeless and states she doesn't want to "do it anymore."  Patient denies a specific plan, however is unable to affirm her safety at this time.  Diagnoses:  Final diagnoses:  Bipolar 2 disorder, major depressive episode ([name])    HPI:   Patient is a 45 year old female with a history of Bipolar II Disorder and PTSD who presents voluntarily to [facility] for SI. Patient is seen in conjunction with TTS.  Patient is tearful throughout assessment, as she reports multiple mounting life stressors. She states multiple times throughout assessment, "I don't want to do it anymore". She reports SI without a plan; she states that she is unable to contract for safety outside of the hospital. In terms of her stressors, she reporting dealing with financial stressors and work stress. She lives with her 2 sons, one is 51 yo and the other is 27 yo; she states that the 43 yo has a developmental delay and ADHD and describes his father as "not dependable" and describes feeling that "I don't have a support system. Everything is on me".  She began seeing Dr. Evelene Croon in September and is compliant with medications; lamictal  75 mg, lexapro 10 mg and PRN vistaril.  She completed intake with Jade [name] for therapy, however due to her work schedule, she hasn't been able to schedule therapy appointments due to her work hours.    Patient recently had to file for long term disability and has recently begun working again.  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  Patient states she "just can't do it anymore.  I love my son, but I can't do it.  I don't want to  be here."  Upon discussion of safety, patient denies having a current suicide plan, however she is unable to contract for safety. She is tearful as she states, "I'm just not sure."  She denies HI, AVH. She is agreeable to inpatient treatment due to safety concerns.  Past Psychiatric History: Previous Medication Trials: lamictal, vistaril, lexapro; has not taken other psychiatric medications before Previous Psychiatric Hospitalizations: no Previous Suicide Attempts: no History of Violence: no Outpatient psychiatrist: Dr. Evelene Croon  Social History: Marital Status: not married Children: 2- 52 yo and 36 yo Source of Income: Clinical biochemist, works from home for [employer] Housing Status: with 2 sons History of phys/sexual abuse: did not assess Easy access to gun: denies  Substance Use (with emphasis over the last 12 months) Recreational Drugs: denies Use of Alcohol: denied Tobacco Use: denied Rehab History: n/a H/O Complicated Withdrawal: no  Legal History: Past Charges/Incarcerations: no Pending charges: no  Family Psychiatric History: Mother-depression    PHQ 2-9:  Flowsheet Row ED from 08/31/2020 in [facility] Counselor from 12/13/2019 in [facility] Office Visit from 08/01/2019 in [facility] Patient Jade [name]  Thoughts that you would be better off dead, or of hurting yourself in some way More than half the days Not at all Not at all  PHQ-9 Total Score 20 15 18       Flowsheet Row ED from 08/31/2020 in [facility]  C-SSRS RISK CATEGORY  Error: Q7 should not be populated when Q6 is No       Total Time spent with patient: 30 minutes  Musculoskeletal  Strength & Muscle Tone: within normal limits Gait & Station: normal Patient leans: N/A  Psychiatric Specialty Exam  Presentation General Appearance: Appropriate for Environment; Casual  Eye Contact:Fair  Speech:Clear and Coherent; Normal  Rate  Speech Volume:Normal  Handedness:No data recorded  Mood and Affect  Mood:Depressed; Worthless  Affect:Tearful; Appropriate   Thought Process  Thought Processes:Coherent; Goal Directed; Linear  Descriptions of Associations:Intact  Orientation:Full (Time, Place and Person)  Thought Content:WDL    Hallucinations:Hallucinations: None  Ideas of Reference:None  Suicidal Thoughts:Suicidal Thoughts: Yes, Active SI Active Intent and/or Plan: Without Intent; Without Plan  Homicidal Thoughts:Homicidal Thoughts: No   Sensorium  Memory:Immediate Good; Recent Good; Remote Good  Judgment:Fair  Insight:Fair   Executive Functions  Concentration:Good  Attention Span:Good  Recall:Good  Fund of Knowledge:Good  Language:Good   Psychomotor Activity  Psychomotor Activity:Psychomotor Activity: Normal   Assets  Assets:Communication Skills; Desire for Improvement; Resilience; Physical Health; Vocational/Educational   Sleep  Sleep:Sleep: Fair   Nutritional Assessment (For OBS and FBC admissions only) Has the patient had a weight loss or gain of 10 pounds or more in the last 3 months?: Yes Has the patient had a decrease in food intake/or appetite?: No Does the patient have dental problems?: No Does the patient have eating habits or behaviors that may be indicators of an eating disorder including binging or inducing vomiting?: No Has the patient recently lost weight without trying?: No Has the patient been eating poorly because of a decreased appetite?: Yes Malnutrition Screening Tool Score: 1    Physical Exam Constitutional:      Appearance: Normal appearance. She is normal weight.  HENT:     Head: Normocephalic and atraumatic.  Pulmonary:     Effort: Pulmonary effort is normal.  Neurological:     Mental Status: She is alert and oriented to person, place, and time.    Review of Systems  Constitutional: Negative for chills and fever.  HENT: Negative for  hearing loss.   Eyes: Negative for discharge and redness.  Respiratory: Negative for cough.   Cardiovascular: Negative for chest pain.  Gastrointestinal: Negative for abdominal pain.  Musculoskeletal: Negative for myalgias.  Neurological: Negative for headaches.  Psychiatric/Behavioral: Positive for depression and suicidal ideas. Negative for hallucinations and substance abuse.    Blood pressure 130/79, pulse 71, temperature 98.6 F (37 C), temperature source Oral, resp. rate 16, SpO2 96 %. There is no height or weight on file to calculate BMI.  Past Psychiatric History: as above   Is the patient at risk to self? Yes  Has the patient been a risk to self in the past 6 months? No .    Has the patient been a risk to self within the distant past? No   Is the patient a risk to others? No   Has the patient been a risk to others in the past 6 months? No   Has the patient been a risk to others within the distant past? No   Past Medical History:  Past Medical History:  Diagnosis Date  . Abnormal Pap smear of cervix   . Anxiety   . Depression   . Vitamin D deficiency 04/2019    Past Surgical History:  Procedure Laterality Date  . CERVICAL BIOPSY  W/ LOOP ELECTRODE EXCISION  03-2013  . CESAREAN SECTION     x 1  .  COLPOSCOPY    . TUBAL LIGATION      Family History:  Family History  Problem Relation Age of Onset  . Hypertension Mother   . Diabetes Mother   . Hyperlipidemia Brother   . Pancreatic cancer Maternal Grandmother   . Diabetes Maternal Grandmother     Social History:  Social History   Socioeconomic History  . Marital status: Single    Spouse name: Not on file  . Number of children: Not on file  . Years of education: Not on file  . Highest education level: Not on file  Occupational History  . Not on file  Tobacco Use  . Smoking status: Never Smoker  . Smokeless tobacco: Never Used  Vaping Use  . Vaping Use: Never used  Substance and Sexual Activity  .  Alcohol use: No    Comment: socially   . Drug use: No  . Sexual activity: Not Currently    Birth control/protection: Surgical  Other Topics Concern  . Not on file  Social History Narrative   Marital status: single; dating seriously x 7 years.      Children:  2 children (14, 6)      Lives: with two sons      Employment:  Conservation officer, nature at [employer]; first shift for 8 years.      Tobacco: none      Alcohol:  4-6 drinks per month      Drugs: none      Exercise:  Sporadic; twice weekly; walking      Seatbelt: 100%      Guns: none      Sexual activity:  Total partners = 6; history of Chlamydia in 2002; last STD screening 2016; males only   Social Determinants of Corporate investment banker Strain: Not on file  Food Insecurity: Not on file  Transportation Needs: Not on file  Physical Activity: Not on file  Stress: Not on file  Social Connections: Not on file  Intimate Partner Violence: Not on file    SDOH:  SDOH Screenings   Alcohol Screen: Not on file  Depression (PHQ2-9): Medium Risk  . PHQ-2 Score: 20  Financial Resource Strain: Not on file  Food Insecurity: Not on file  Housing: Not on file  Physical Activity: Not on file  Social Connections: Not on file  Stress: Not on file  Tobacco Use: Low Risk   . Smoking Tobacco Use: Never Smoker  . Smokeless Tobacco Use: Never Used  Transportation Needs: Not on file    Last Labs:  Admission on 08/31/2020  Component Date Value Ref Range Status  . POC Amphetamine UR 08/31/2020 None Detected  NONE DETECTED (Cut Off Level 1000 ng/mL) Final  . POC Secobarbital (BAR) 08/31/2020 None Detected  NONE DETECTED (Cut Off Level 300 ng/mL) Final  . POC Buprenorphine (BUP) 08/31/2020 None Detected  NONE DETECTED (Cut Off Level 10 ng/mL) Final  . POC Oxazepam (BZO) 08/31/2020 None Detected  NONE DETECTED (Cut Off Level 300 ng/mL) Final  . POC Cocaine UR 08/31/2020 None Detected  NONE DETECTED (Cut Off Level 300 ng/mL) Final  . POC Methamphetamine UR  08/31/2020 None Detected  NONE DETECTED (Cut Off Level 1000 ng/mL) Final  . POC Morphine 08/31/2020 None Detected  NONE DETECTED (Cut Off Level 300 ng/mL) Final  . POC Oxycodone UR 08/31/2020 None Detected  NONE DETECTED (Cut Off Level 100 ng/mL) Final  . POC Methadone UR 08/31/2020 None Detected  NONE DETECTED (Cut Off Level 300 ng/mL)  Final  . POC Marijuana UR 08/31/2020 None Detected  NONE DETECTED (Cut Off Level 50 ng/mL) Final  . SARS Coronavirus 2 Ag 08/31/2020 Negative  Negative Final  . SARSCOV2ONAVIRUS 2 AG 08/31/2020 NEGATIVE  NEGATIVE Final   Comment: (NOTE) SARS-CoV-2 antigen NOT DETECTED.   Negative results are presumptive.  Negative results do not preclude SARS-CoV-2 infection and should not be used as the sole basis for treatment or other patient management decisions, including infection  control decisions, particularly in the presence of clinical signs and  symptoms consistent with COVID-19, or in those who have been in contact with the virus.  Negative results must be combined with clinical observations, patient history, and epidemiological information. The expected result is Negative.  Fact Sheet for Patients: https://www.jennings-kim.com/  Fact Sheet for Healthcare Providers: https://alexander-rogers.biz/  This test is not yet approved or cleared by the [place] FDA and  has been authorized for detection and/or diagnosis of SARS-CoV-2 by FDA under an Emergency Use Authorization (EUA).  This EUA will remain in effect (meaning this test can be used) for the duration of  the COV                          ID-19 declaration under Section 564(b)(1) of the Act, 21 U.S.C. section 360bbb-3(b)(1), unless the authorization is terminated or revoked sooner.      Allergies: Patient has no known allergies.  PTA Medications: (Not in a hospital admission)   Medical Decision Making  Patient is currently reporting SI and is unable to contract for  safety. She meets criteria for inpatient admission based on imminent risk to self. Patient reports that she does not take any other home medications  Routine Labs ordered- CBC, CMP, TSH, a1c, lipid panel, ethanol, UDS, pregnancy test, EKG.  Continue home medications: lamictal 75 mg for mood lexapro 10 mg for mood Vistaril 10 mg PRN TID for anxiety     Recommendations  Based on my evaluation the patient does not appear to have an emergency medical condition.  Estella Husk, MD 08/31/20  1:20 PM

## 2020-08-31 NOTE — Discharge Instructions (Addendum)
Transfer to bhh °

## 2020-08-31 NOTE — Tx Team (Signed)
Initial Treatment Plan 08/31/2020 6:45 PM Jade [name] [id number]    PATIENT STRESSORS: Financial difficulties Marital or family conflict Occupational concerns   PATIENT STRENGTHS: Ability for insight Active sense of humor Average or above average intelligence Capable of independent living Metallurgist fund of knowledge Motivation for treatment/growth Physical Health Supportive family/friends   PATIENT IDENTIFIED PROBLEMS: depression  anxiety  SI  Financial stress  Single parent trying to raise 2 sons, one with special needs   Job stress, work piling on           DISCHARGE CRITERIA:  Ability to meet basic life and health needs Improved stabilization in mood, thinking, and/or behavior Motivation to continue treatment in a less acute level of care Safe-care adequate arrangements made  PRELIMINARY DISCHARGE PLAN: Attend aftercare/continuing care group Outpatient therapy Return to previous living arrangement Return to previous work or school arrangements  PATIENT/FAMILY INVOLVEMENT: This treatment plan has been presented to and reviewed with the patient, Jade [name] .  The patient has been given the opportunity to ask questions and make suggestions.  Wardell Heath, RN 08/31/2020, 6:45 PM

## 2020-08-31 NOTE — Progress Notes (Signed)
Patient did not attend wrap up group. 

## 2020-08-31 NOTE — Progress Notes (Signed)
Patient information has been sent to [facility] via secure chat to review for potential admission. Patient meets inpatient criteria per Dr.Laubach.   Situation ongoing, CSW will continue to monitor progress.    Signed:  Damita Dunnings, MSW, LCSW-A  08/31/2020 1:42 PM

## 2020-08-31 NOTE — BH Assessment (Signed)
Patient reports to Jade [name] with SI/ plan and intent . Patient reports she does not want to do it anymore . I tired of it all ". Patient denies Hi/ AVH / or substance use . Patient is very tearful and stated she has been feeling this way for past two weeks . Patient sees Dr Westley Chandler but no appointments for today . Patient is emergent
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
## 2020-09-01 DIAGNOSIS — F4310 Post-traumatic stress disorder, unspecified: Secondary | ICD-10-CM | POA: Diagnosis present

## 2020-09-01 LAB — HEMOGLOBIN A1C
Hgb A1c MFr Bld: 6.3 % — ABNORMAL HIGH (ref 4.8–5.6)
Mean Plasma Glucose: 134 mg/dL

## 2020-09-01 MED ORDER — LAMOTRIGINE 25 MG PO TABS
25.0000 mg | ORAL_TABLET | Freq: Once | ORAL | Status: AC
  Administered 2020-09-01: 25 mg via ORAL
  Filled 2020-09-01: qty 1

## 2020-09-01 MED ORDER — ESCITALOPRAM OXALATE 10 MG PO TABS
10.0000 mg | ORAL_TABLET | Freq: Once | ORAL | Status: AC
  Administered 2020-09-01: 10 mg via ORAL
  Filled 2020-09-01: qty 1

## 2020-09-01 MED ORDER — ESCITALOPRAM OXALATE 20 MG PO TABS
20.0000 mg | ORAL_TABLET | Freq: Every day | ORAL | Status: DC
  Administered 2020-09-02 – 2020-09-03 (×2): 20 mg via ORAL
  Filled 2020-09-01: qty 7
  Filled 2020-09-01 (×2): qty 1
  Filled 2020-09-01: qty 7
  Filled 2020-09-01: qty 1

## 2020-09-01 MED ORDER — LAMOTRIGINE 100 MG PO TABS
100.0000 mg | ORAL_TABLET | Freq: Every day | ORAL | Status: DC
  Administered 2020-09-02 – 2020-09-03 (×2): 100 mg via ORAL
  Filled 2020-09-01: qty 7
  Filled 2020-09-01: qty 1
  Filled 2020-09-01: qty 7
  Filled 2020-09-01 (×2): qty 1

## 2020-09-01 NOTE — BHH Group Notes (Signed)
Adult Psychoeducational Group Note  Date:  09/01/2020 Time:  10:06 AM  Group Topic/Focus:  Goals Group:   The focus of this group is to help patients establish daily goals to achieve during treatment and discuss how the patient can incorporate goal setting into their daily lives to aide in recovery.  Participation Level:  Active  Participation Quality:  Appropriate  Affect:  Appropriate  Cognitive:  Appropriate  Insight: Appropriate  Engagement in Group:  Engaged  Modes of Intervention:  Discussion  Additional Comments:  Patient attended the goals group and remained appropriate and engaged the duration of the group.Patient's goal was to find coping skills for stress. Alden Feagan T Johniya Durfee 09/01/2020, 10:06 AM

## 2020-09-01 NOTE — Progress Notes (Addendum)
   08/31/20 2000  Psych Admission Type (Psych Patients Only)  Admission Status Voluntary  Psychosocial Assessment  Patient Complaints Anxiety;Depression;Other (Comment);Loneliness;Sadness (overwhelmed)  Eye Contact Fair  Facial Expression Sad  Affect Depressed  Speech Soft  Interaction Assertive  Motor Activity Other (Comment) (wnl)  Appearance/Hygiene Unremarkable  Behavior Characteristics Cooperative;Anxious  Mood Depressed;Anxious;Pleasant  Thought Process  Coherency WDL  Content WDL  Delusions None reported or observed  Perception WDL  Hallucination None reported or observed  Judgment WDL  Confusion None  Danger to Self  Current suicidal ideation? Denies  Danger to Others  Danger to Others None reported or observed   Pt seen in her room. Pt denies SI, HI, AVH and pain. Pt rates anxiety 8/10 and depression 10/10. Pt endorses loneliness and sadness. "I was just at the end of my rope. I'm the one who everyone depends on but no one asks if I am okay. They say, 'Oh she's fine.' But work is stressful and bills and everything. I started to see a therapist named Idalia Needle at [facility] but, with my work schedule, I haven't made an appointment and I know I need therapy." Pt works at [employer] as a Museum/gallery conservator. Pt says she had a mental break like this in March 2021 and was out of work for a while. "I don't know what will happen (at work) after being in the hospital this time." Pt given emotional support and encouraged to seek assistance from the social workers for all discharge needs while she is here.

## 2020-09-01 NOTE — Progress Notes (Signed)
   09/01/20 1015  Psych Admission Type (Psych Patients Only)  Admission Status Voluntary  Psychosocial Assessment  Patient Complaints Anxiety;Depression;Worrying  Eye Contact Fair  Facial Expression Sad  Affect Depressed  Speech Soft  Interaction Assertive  Motor Activity Other (Comment) (wnl)  Appearance/Hygiene Unremarkable  Behavior Characteristics Cooperative;Anxious  Mood Depressed;Anxious  Thought Process  Coherency WDL  Content WDL  Delusions None reported or observed  Perception WDL  Hallucination None reported or observed  Judgment WDL  Confusion None  Danger to Self  Current suicidal ideation? Denies  Danger to Others  Danger to Others None reported or observed

## 2020-09-01 NOTE — Progress Notes (Signed)
Recreation Therapy Notes  Animal-Assisted Activity (AAA) Program Checklist/Progress Notes Patient Eligibility Criteria Checklist & Daily Group note for Rec Tx Intervention  Date: 6.7.22 Time: 1430 Location: [address]   AAA/T Program Assumption of Risk Form signed by Engineer, production or Parent Legal Guardian  YES   Patient is free of allergies or severe asthma  YES  Patient reports no fear of animals  YES  Patient reports no history of cruelty to animals YES   Patient understands his/her participation is voluntary  YES   Patient washes hands before animal contact  YES   Patient washes hands after animal contact YES  Education: Charity fundraiser, Appropriate Animal Interaction   Education Outcome: Acknowledges understanding/In group clarification offered/Needs additional education.   Clinical Observations/Feedback: Pt did not attend group activity.    Caroll Rancher, LRT/CTRS         Caroll Rancher A 09/01/2020 3:34 PM
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
## 2020-09-01 NOTE — Progress Notes (Signed)
Patient rated her day as a 6 out of 10 but did not go into further detail. Her goal for tomorrow is to relax more and to stop thinking as much.

## 2020-09-01 NOTE — [facility] Group Notes (Signed)
[facility] Group Notes:  (Nursing/MHT/Case Management/Adjunct)  Date:  09/01/2020  Time:  9:46 AM  Type of Therapy:  Goals group  Participation Level:  Active  Participation Quality:  Appropriate  Affect:  Appropriate  Cognitive:  Alert  Insight:  Improving  Engagement in Group:  Engaged  Modes of Intervention:  Discussion and Education  Summary of Progress/Problems:  Zareen reporting sleep was "so so" despite medication for sleep.  She reported her goal for today is "getting help with stress management."  Levin Bacon 09/01/2020, 9:46 AM

## 2020-09-01 NOTE — [facility] Group Notes (Signed)
[facility] Group Notes:  (Nursing/MHT/Case Management/Adjunct)  Date:  09/01/2020  Time:  6:40 PM  Type of Therapy:  Sharing / Getting to know you  Participation Level:  Did Not Attend  Participation Quality:    Affect:    Cognitive:    Insight:    Engagement in Group:    Modes of Intervention:  Activity  Summary of Progress/Problems:  Did not attend despite staff encouragement.  [name] V [name] 09/01/2020, 6:40 PM  

## 2020-09-01 NOTE — H&P (Signed)
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 certify that inpatient services furnished can reasonably be expected to improve the patient's condition.    Arthor Captain, MD 6/7/20224:09 PM
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
## 2020-09-02 NOTE — BHH Suicide Risk Assessment (Signed)
BHH INPATIENT:  Family/Significant Other Suicide Prevention Education  Suicide Prevention Education:  Education Completed; Deri Fuelling [phone] (Friend) has been identified by the patient as the family member/significant other with whom the patient will be residing, and identified as the person(s) who will aid the patient in the event of a mental health crisis (suicidal ideations/suicide attempt).  With written consent from the patient, the family member/significant other has been provided the following suicide prevention education, prior to the and/or following the discharge of the patient.  The suicide prevention education provided includes the following:  Suicide risk factors  Suicide prevention and interventions  National Suicide Hotline telephone number  [facility] assessment telephone number  [facility] Emergency Assistance 911  [facility] and/or Residential Mobile Crisis Unit telephone number  Request made of family/significant other to:  Remove weapons (e.g., guns, rifles, knives), all items previously/currently identified as safety concern.    Remove drugs/medications (over-the-counter, prescriptions, illicit drugs), all items previously/currently identified as a safety concern.  The family member/significant other verbalizes understanding of the suicide prevention education information provided.  The family member/significant other agrees to remove the items of safety concern listed above.  CSW spoke with Mrs. Jean Rosenthal who states that her friend told her that she had a "break down" at work.  She states that there friend was diagnosed approximately 1 year ago with Bipolar Disorder and was started on medications.  Mrs. Jean Rosenthal states that her friend stopped taking these medications and stopped seeing her therapist several months afterwards. Mrs. Jean Rosenthal states that her friend has been under a lot of stress at home and at work and they have been talking  about her changing jobs.  Mrs. Jean Rosenthal states that her friend is good with her minor children and has no weapons or firearms in the home.  She also states that they talk on the phone several times a week and often discuss their thoughts and feelings with each other.  Mrs. Jean Rosenthal states that she will continue to check on her friend regularly and will help her as much as she can.  CSW completed SPE with Mrs. Jean Rosenthal.   Metro Kung Cahterine Heinzel 09/02/2020, 3:42 PM

## 2020-09-02 NOTE — BHH Group Notes (Addendum)
BHH Group Notes:  (Nursing/MHT/Case Management/Adjunct)  Date:  09/02/2020  Time:  9:02 AM  Type of Therapy:  Goals group  Participation Level:  Did Not Attend  Participation Quality:    Affect:    Cognitive:    Insight:    Engagement in Group:    Modes of Intervention:  Discussion and Education  Summary of Progress/Problems:  Did not attend despite staff encouragement.   Norm Parcel [place] 09/02/2020, 9:02 AM

## 2020-09-02 NOTE — Progress Notes (Signed)
[facility] MD Progress Note  09/02/2020 3:48 PM JESSIAH WOJNAR  MRN:  [id number]   Reason for admission:  Nezzie Manera Mooreis a 45 year old female with prior diagnoses of bipolar disorder type II most recent episode depressed and anxiety who presented to the [facility] reporting worsening depressive symptoms of 2 weeks duration with suicidal ideation without stated plan but unable to contract for safety outside the hospital.   Objective: Medical record reviewed.  Patient's case discussed in detail with members of the treatment team.  I met with and evaluated the patient today on the unit for follow-up.  She reports feeling much better, "less anxious, less stressed."  Irene Shipper is cooperative and pleasant during our conversation appears better during our interaction today.  She appears more relaxed, maintains good eye contact and affect is much brighter.  She states she no longer feels as overwhelmed by external stressors and was able to get some of her work stress resolved and get a short-term leave of absence from work.  She denies passive wish for death, SI, AI, HI.  She states that sleep and appetite are okay.  She denies anhedonia and is trying to think about things she can do for herself outside the hospital such as go to a movie or play.  She denies any rash, headache, nausea, jitteriness or other medication side effects.  She denies any physical problems.  She would like to participate in outpatient therapy and possibly outpatient partial hospital program or day hospital program if it is covered by her insurance after discharge (social work is investigating this).  The patient slept 6.6 hours last night.  She has gone to occasional groups.  She has been appropriate in her interactions with peers and staff.  Principal Problem: Bipolar depression ([place]) Diagnosis: Principal Problem:   Bipolar depression ([place]) Active Problems:   Anxiety disorder, unspecified   PTSD (post-traumatic  stress disorder)  Total Time spent with patient: 25 minutes  Past Psychiatric History: See admission H&P  Past Medical History:  Past Medical History:  Diagnosis Date  . Abnormal Pap smear of cervix   . Anxiety   . Depression   . Vitamin D deficiency 04/2019    Past Surgical History:  Procedure Laterality Date  . CERVICAL BIOPSY  W/ LOOP ELECTRODE EXCISION  03-2013  . CESAREAN SECTION     x 1  . COLPOSCOPY    . TUBAL LIGATION     Family History:  Family History  Problem Relation Age of Onset  . Hypertension Mother   . Diabetes Mother   . Hyperlipidemia Brother   . Pancreatic cancer Maternal Grandmother   . Diabetes Maternal Grandmother    Family Psychiatric  History: See admission H&P Social History:  Social History   Substance and Sexual Activity  Alcohol Use No   Comment: socially      Social History   Substance and Sexual Activity  Drug Use No    Social History   Socioeconomic History  . Marital status: Single    Spouse name: Not on file  . Number of children: 2  . Years of education: 34  . Highest education level: Some college, no degree  Occupational History  . Not on file  Tobacco Use  . Smoking status: Never Smoker  . Smokeless tobacco: Never Used  Vaping Use  . Vaping Use: Never used  Substance and Sexual Activity  . Alcohol use: No    Comment: socially   . Drug use:  No  . Sexual activity: Not Currently    Birth control/protection: Surgical  Other Topics Concern  . Not on file  Social History Narrative   Marital status: single; dating seriously x 7 years.      Children:  2 children (14, 6)      Lives: with two sons      Employment:  Scientist, water quality at [employer]; first shift for 8 years.      Tobacco: none      Alcohol:  4-6 drinks per month      Drugs: none      Exercise:  Sporadic; twice weekly; walking      Seatbelt: 100%      Guns: none      Sexual activity:  Total partners = 6; history of Chlamydia in 2002; last STD screening 2016; males  only   Social Determinants of Radio broadcast assistant Strain: Not on file  Food Insecurity: Not on file  Transportation Needs: Not on file  Physical Activity: Not on file  Stress: Not on file  Social Connections: Not on file   Additional Social History:                         Sleep: Good  Appetite:  Good  Current Medications: Current Facility-Administered Medications  Medication Dose Route Frequency Provider Last Rate Last Admin  . acetaminophen (TYLENOL) tablet 650 mg  650 mg Oral Q6H PRN Ival Bible, MD   650 mg at 09/01/20 1013  . alum & mag hydroxide-simeth (MAALOX/MYLANTA) 200-200-20 MG/5ML suspension 30 mL  30 mL Oral Q4H PRN Ival Bible, MD      . escitalopram (LEXAPRO) tablet 20 mg  20 mg Oral Daily Arthor Captain, MD   20 mg at 09/02/20 0623  . hydrOXYzine (ATARAX/VISTARIL) tablet 10 mg  10 mg Oral TID PRN Ival Bible, MD   10 mg at 09/01/20 2129  . lamoTRIgine (LAMICTAL) tablet 100 mg  100 mg Oral Daily Arthor Captain, MD   100 mg at 09/02/20 7628  . magnesium hydroxide (MILK OF MAGNESIA) suspension 30 mL  30 mL Oral Daily PRN Ival Bible, MD      . traZODone (DESYREL) tablet 50 mg  50 mg Oral QHS PRN Ival Bible, MD   50 mg at 09/01/20 2128    Lab Results: No results found for this or any previous visit (from the past 48 hour(s)).  Blood Alcohol level:  Lab Results  Component Value Date   ETH <10 31/51/7616    Metabolic Disorder Labs: Lab Results  Component Value Date   HGBA1C 6.3 (H) 08/31/2020   MPG 134 08/31/2020   MPG 120 (H) 11/17/2014   No results found for: PROLACTIN Lab Results  Component Value Date   CHOL 195 08/31/2020   TRIG 92 08/31/2020   HDL 49 08/31/2020   CHOLHDL 4.0 08/31/2020   VLDL 18 08/31/2020   LDLCALC 128 (H) 08/31/2020   LDLCALC 113 (H) 05/24/2019    Physical Findings: AIMS:  , ,  ,  ,    CIWA:    COWS:     Musculoskeletal: Strength & Muscle Tone: within  normal limits Gait & Station: normal Patient leans: N/A  Psychiatric Specialty Exam:  Presentation  General Appearance: Appropriate for Environment; Casual; Neat  Eye Contact:Good  Speech:Clear and Coherent; Normal Rate  Speech Volume:Normal  Handedness:No data recorded  Mood and Affect  Mood:Euthymic  Affect:Congruent  Thought Process  Thought Processes:Coherent; Goal Directed; Linear  Descriptions of Associations:Intact  Orientation:Full (Time, Place and Person)  Thought Content:Logical  History of Schizophrenia/Schizoaffective disorder:No  Duration of Psychotic Symptoms:No data recorded Hallucinations:Hallucinations: None  Ideas of Reference:None  Suicidal Thoughts:Suicidal Thoughts: No SI Passive Intent and/or Plan: Without Intent; Without Plan  Homicidal Thoughts:Homicidal Thoughts: No   Sensorium  Memory:Immediate Good; Recent Good; Remote Good  Judgment:Good  Insight:Good   Executive Functions  Concentration:Good  Attention Span:Good  [place] of Knowledge:Good  Language:Good   Psychomotor Activity  Psychomotor Activity:Psychomotor Activity: Normal   Assets  Assets:Communication Skills; Desire for Improvement; Housing; Resilience; Social Support; Physical Health; Vocational/Educational   Sleep  Sleep:Sleep: Fair Number of Hours of Sleep: 6.6    Physical Exam: Physical Exam Vitals and nursing note reviewed.  Constitutional:      General: She is not in acute distress.    Appearance: Normal appearance.  HENT:     Head: Normocephalic and atraumatic.  Pulmonary:     Effort: Pulmonary effort is normal.  Neurological:     General: No focal deficit present.     Mental Status: She is alert and oriented to person, place, and time.    Review of Systems  Respiratory: Negative.   Cardiovascular: Negative.   Gastrointestinal: Negative.   Musculoskeletal: Negative.   Neurological: Negative.   Psychiatric/Behavioral:  Negative for hallucinations and suicidal ideas. The patient is nervous/anxious.   All other systems reviewed and are negative.  Blood pressure 95/68, pulse 76, temperature 98 F (36.7 C), temperature source Oral, resp. rate 18, height '5\' 1"'  (1.549 m), weight 116.1 kg, SpO2 94 %. Body mass index is 48.37 kg/m.   Treatment Plan Summary: Daily contact with patient to assess and evaluate symptoms and progress in treatment and Medication management   Continue every 15-minute observation status  Encourage participation in group therapy and therapeutic milieu  Bipolar depression -Continue lamotrigine 100 mg daily for mood stabilization -Continue Lexapro 20 mg daily for depression and anxiety  Anxiety -Continue Lexapro 20 mg daily for depression and anxiety -Continue hydroxyzine 10 mg 3 times daily PRN  Insomnia -Continue trazodone 50 mg at bedtime PRN  Discharge planning in progress.  Patient needs referral to outpatient therapist.  She will continue to see Dr. Toy Care medication management.  Social work was investigating whether patient has a PHP or IOP benefit covered under her insurance.  It appears that she does not.  Anticipate probable discharge within the next 24 to 48 hours.   Arthor Captain, MD 09/02/2020, 3:48 PM

## 2020-09-02 NOTE — Progress Notes (Signed)
Recreation Therapy Notes  Date: 6.8.22 Time: 0930 Location: 300 Hall Dayroom  Group Topic: Stress Management   Goal Area(s) Addresses:  Patient will actively participate in stress management techniques presented during session.  Patient will successfully identify benefit of practicing stress management post d/c.   Intervention: Guided exercise with ambient sound and script  Activity :Guided Imagery  LRT provided education, instruction, and demonstration on practice of visualization via guided imagery. Patient was asked to participate in the technique introduced during session. LRT also debriefed including topics of mindfulness, stress management and specific scenarios each patient could use these techniques.    Education:  Stress Management, Discharge Planning.   Education Outcome: Acknowledges education  Clinical Observations/Feedback: Patient did not attend activity.    Caroll Rancher, LRT/CTRS         Caroll Rancher A 09/02/2020 11:57 AM

## 2020-09-02 NOTE — Progress Notes (Addendum)
   09/02/20 2100  Psych Admission Type (Psych Patients Only)  Admission Status Voluntary  Psychosocial Assessment  Patient Complaints Other (Comment) (sleepiness in the morning)  Facial Expression Other (Comment) (appropriate)  Affect Depressed  Speech Soft  Interaction Assertive  Motor Activity Other (Comment) (wnl)  Appearance/Hygiene Unremarkable;In hospital gown  Behavior Characteristics Cooperative;Appropriate to situation  Mood Pleasant  Thought Process  Coherency WDL  Content WDL  Delusions None reported or observed  Perception WDL  Hallucination None reported or observed  Judgment WDL  Confusion None  Danger to Self  Current suicidal ideation? Denies  Danger to Others  Danger to Others None reported or observed   Pt seen in dayroom. Pt looks brighter than 2 days ago. Pt says she is taking things "one at a time instead of trying to handle everything together." Pt denies SI, HI, AVH and pain. Says she wants to find a therapist preferable one who is available after 6 pm so she can continue to see her after her leave of absence from her job. Pt planning on taking short term disability for 12 weeks and participating in IOP.  Pt has been groggy in the mornings so decided to take just Vistaril 10 mg to help her sleep instead of the Trazodone.

## 2020-09-02 NOTE — Progress Notes (Signed)
Adult Psychoeducational Group Note  Date:  09/02/2020 Time:  9:28 PM  Group Topic/Focus:  Wrap-Up Group:   The focus of this group is to help patients review their daily goal of treatment and discuss progress on daily workbooks.  Participation Level:  Active  Participation Quality:  Appropriate  Affect:  Appropriate  Cognitive:  Appropriate  Insight: Appropriate  Engagement in Group:  Engaged  Modes of Intervention:  Discussion  Additional Comments:  Patient said his day was a 9. His goal for today was to relax and focus on how her medications being increased would affect her. She achieved her goal. Her coping skills to focus on one thing at a time.  Charna Busman Long 09/02/2020, 9:28 PM

## 2020-09-02 NOTE — Plan of Care (Signed)
  Problem: Self-Concept: Goal: Level of anxiety will decrease Outcome: Progressing   Problem: Education: Goal: Knowledge of the prescribed therapeutic regimen will improve Outcome: Progressing

## 2020-09-02 NOTE — [facility] Counselor (Signed)
CSW provided the patient with a packet of resources that included: housing resources, free/reduced price food resources, resources for low cost child care, the [facility], suicide prevention, and [employer] cards.  Patient was encouraged to look through and call some of these resources to find programs that will benefit her outside of the hospital.

## 2020-09-02 NOTE — Progress Notes (Signed)
   D:  Pt presents with mild anxiety (3/10).  Pt denies SI/HI, and verbally contracts to see RN or staff member should thoughts arise.  Pt denies AVH.  A:  Labs/Vitals monitored; Medication education provided; Pt supported emotionally; Pt asked to communicate her needs, concerns, and questions.  R:  Pt remains safe with 15 minute checks; Will continue POC.       09/01/20 2129  Psych Admission Type (Psych Patients Only)  Admission Status Voluntary  Psychosocial Assessment  Patient Complaints Anxiety;Worrying  Eye Contact Fair  Facial Expression Sad  Affect Depressed  Speech Soft  Interaction Assertive  Motor Activity Other (Comment) (wnl)  Appearance/Hygiene Unremarkable  Behavior Characteristics Cooperative;Anxious  Mood Depressed;Anxious  Thought Process  Coherency WDL  Content WDL  Delusions None reported or observed  Perception WDL  Hallucination None reported or observed  Judgment WDL  Confusion None  Danger to Self  Current suicidal ideation? Denies  Danger to Others  Danger to Others None reported or observed

## 2020-09-02 NOTE — [facility] Group Notes (Signed)
Type of Therapy and Topic: Group Therapy:  Positive Affirmations  Participation Level:  Active  Description of Group: This group addressed positive affirmation toward self and others. Patients went around the room and identified two positive things about themselves and two positive things about a peer in the room. Patients reflected on how it felt to share something positive with others, to identify positive things about themselves, and to hear positive things from others. Patients were encouraged to have a daily reflection of positive characteristics or circumstances.  Therapeutic Goals 1. Patient will verbalize two of their positive qualities 2. Patient will demonstrate empathy for others by stating two positive qualities about a peer in the group 3. Patient will verbalize their feelings when voicing positive self affirmations and when voicing positive affirmations of others 4. Patients will discuss the potential positive impact on their wellness/recovery of focusing on positive traits of self and others.   Summary of Patient Progress: Pt interacted appropriately with peers and participated in group.    Therapeutic Modalities Cognitive Behavioral Therapy Motivational Interviewing 

## 2020-09-02 NOTE — Progress Notes (Addendum)
Pt states she felt drowsy this a.m.-got vistaril 10 and trazodone 50 HS. Pt denies SI/HI/AVH. Pt appears anxious on approach; states she is working on getting short term disability for work and enroll in a intensive day program once she leaves. Pt was pleasant on approach and remains safe.

## 2020-09-02 NOTE — Tx Team (Signed)
Interdisciplinary Treatment and Diagnostic Plan Update  09/02/2020 Time of Session: 9:25am  Jade [name] MRN: [id number]  Principal Diagnosis: Bipolar depression ([name])  Secondary Diagnoses: Principal Problem:   Bipolar depression ([name]) Active Problems:   Anxiety disorder, unspecified   PTSD (post-traumatic stress disorder)   Current Medications:  Current Facility-Administered Medications  Medication Dose Route Frequency Provider Last Rate Last Admin  . acetaminophen (TYLENOL) tablet 650 mg  650 mg Oral Q6H PRN Estella Husk, MD   650 mg at 09/01/20 1013  . alum & mag hydroxide-simeth (MAALOX/MYLANTA) 200-200-20 MG/5ML suspension 30 mL  30 mL Oral Q4H PRN Estella Husk, MD      . escitalopram (LEXAPRO) tablet 20 mg  20 mg Oral Daily Claudie Revering, MD   20 mg at 09/02/20 1829  . hydrOXYzine (ATARAX/VISTARIL) tablet 10 mg  10 mg Oral TID PRN Estella Husk, MD   10 mg at 09/01/20 2129  . lamoTRIgine (LAMICTAL) tablet 100 mg  100 mg Oral Daily Claudie Revering, MD   100 mg at 09/02/20 9371  . magnesium hydroxide (MILK OF MAGNESIA) suspension 30 mL  30 mL Oral Daily PRN Estella Husk, MD      . traZODone (DESYREL) tablet 50 mg  50 mg Oral QHS PRN Estella Husk, MD   50 mg at 09/01/20 2128   PTA Medications: Medications Prior to Admission  Medication Sig Dispense Refill Last Dose  . escitalopram (LEXAPRO) 10 MG tablet Take 1 tablet (10 mg total) by mouth daily. 90 tablet 0   . hydrOXYzine (ATARAX/VISTARIL) 10 MG tablet Take 1 tablet (10 mg total) by mouth 3 (three) times daily as needed for anxiety. 270 tablet 0   . lamoTRIgine (LAMICTAL) 25 MG tablet Take 3 tablets daily 270 tablet 0     Patient Stressors: Financial difficulties Marital or family conflict Occupational concerns  Patient Strengths: Ability for insight Active sense of humor Average or above average intelligence Capable of independent living [employer] fast streamer fund of knowledge Motivation for treatment/growth Physical Health Supportive family/friends  Treatment Modalities: Medication Management, Group therapy, Case management,  1 to 1 session with clinician, Psychoeducation, Recreational therapy.   Physician Treatment Plan for Primary Diagnosis: Bipolar depression ([name]) Long Term Goal(s): Improvement in symptoms so as ready for discharge Improvement in symptoms so as ready for discharge   Short Term Goals: Ability to identify changes in lifestyle to reduce recurrence of condition will improve Ability to verbalize feelings will improve Ability to disclose and discuss suicidal ideas Ability to demonstrate self-control will improve Ability to identify and develop effective coping behaviors will improve Ability to identify triggers associated with substance abuse/mental health issues will improve Ability to identify changes in lifestyle to reduce recurrence of condition will improve Ability to verbalize feelings will improve Ability to disclose and discuss suicidal ideas Ability to demonstrate self-control will improve Ability to identify and develop effective coping behaviors will improve Ability to identify triggers associated with substance abuse/mental health issues will improve  Medication Management: Evaluate patient's response, side effects, and tolerance of medication regimen.  Therapeutic Interventions: 1 to 1 sessions, Unit Group sessions and Medication administration.  Evaluation of Outcomes: Progressing  Physician Treatment Plan for Secondary Diagnosis: Principal Problem:   Bipolar depression ([name]) Active Problems:   Anxiety disorder, unspecified   PTSD (post-traumatic stress disorder)  Long Term Goal(s): Improvement in symptoms so as ready for discharge Improvement in symptoms so as ready for discharge  Short Term Goals: Ability to identify changes in lifestyle to reduce recurrence of condition will  improve Ability to verbalize feelings will improve Ability to disclose and discuss suicidal ideas Ability to demonstrate self-control will improve Ability to identify and develop effective coping behaviors will improve Ability to identify triggers associated with substance abuse/mental health issues will improve Ability to identify changes in lifestyle to reduce recurrence of condition will improve Ability to verbalize feelings will improve Ability to disclose and discuss suicidal ideas Ability to demonstrate self-control will improve Ability to identify and develop effective coping behaviors will improve Ability to identify triggers associated with substance abuse/mental health issues will improve     Medication Management: Evaluate patient's response, side effects, and tolerance of medication regimen.  Therapeutic Interventions: 1 to 1 sessions, Unit Group sessions and Medication administration.  Evaluation of Outcomes: Progressing   RN Treatment Plan for Primary Diagnosis: Bipolar depression ([name]) Long Term Goal(s): Knowledge of disease and therapeutic regimen to maintain health will improve  Short Term Goals: Ability to remain free from injury will improve, Ability to participate in decision making will improve, Ability to verbalize feelings will improve, Ability to disclose and discuss suicidal ideas and Ability to identify and develop effective coping behaviors will improve  Medication Management: RN will administer medications as ordered by provider, will assess and evaluate patient's response and provide education to patient for prescribed medication. RN will report any adverse and/or side effects to prescribing provider.  Therapeutic Interventions: 1 on 1 counseling sessions, Psychoeducation, Medication administration, Evaluate responses to treatment, Monitor vital signs and CBGs as ordered, Perform/monitor CIWA, COWS, AIMS and Fall Risk screenings as ordered, Perform wound care  treatments as ordered.  Evaluation of Outcomes: Progressing   LCSW Treatment Plan for Primary Diagnosis: Bipolar depression ([name]) Long Term Goal(s): Safe transition to appropriate next level of care at discharge, Engage patient in therapeutic group addressing interpersonal concerns.  Short Term Goals: Engage patient in aftercare planning with referrals and resources, Increase social support, Increase emotional regulation, Facilitate acceptance of mental health diagnosis and concerns, Identify triggers associated with mental health/substance abuse issues and Increase skills for wellness and recovery  Therapeutic Interventions: Assess for all discharge needs, 1 to 1 time with Social worker, Explore available resources and support systems, Assess for adequacy in community support network, Educate family and significant other(s) on suicide prevention, Complete Psychosocial Assessment, Interpersonal group therapy.  Evaluation of Outcomes: Progressing   Progress in Treatment: Attending groups: Yes. Participating in groups: Yes. Taking medication as prescribed: Yes. Toleration medication: Yes. Family/Significant other contact made: Yes, individual(s) contacted:  Friend  Patient understands diagnosis: Yes. Discussing patient identified problems/goals with staff: Yes. Medical problems stabilized or resolved: Yes. Denies suicidal/homicidal ideation: Yes. Issues/concerns per patient self-inventory: No.   New problem(s) identified: No, Describe:  None   New Short Term/Long Term Goal(s): medication stabilization, elimination of SI thoughts, development of comprehensive mental wellness plan.   Patient Goals:  Did not attend   Discharge Plan or Barriers: Patient recently admitted. CSW will continue to follow and assess for appropriate referrals and possible discharge planning.   Reason for Continuation of Hospitalization: Anxiety Depression Medication stabilization Suicidal  ideation  Estimated Length of Stay: 3 to 5 days   Attendees: Patient: Did not attend  09/02/2020   Physician: Clifton Custard, MD 09/02/2020   Nursing:  09/02/2020  RN Care Manager: 09/02/2020   Social Worker: Melba Coon, LCSWA  09/02/2020   Recreational Therapist:  09/02/2020   Other:  09/02/2020  Other:  09/02/2020   Other: 09/02/2020     Scribe for Treatment Team: Aram Beecham, LCSWA 09/02/2020 10:41 AM

## 2020-09-03 DIAGNOSIS — F319 Bipolar disorder, unspecified: Secondary | ICD-10-CM

## 2020-09-03 MED ORDER — LAMOTRIGINE 100 MG PO TABS: 100.0000 mg | ORAL_TABLET | Freq: Every day | ORAL | 0 refills | Status: DC

## 2020-09-03 MED ORDER — ESCITALOPRAM OXALATE 20 MG PO TABS: 20.0000 mg | ORAL_TABLET | Freq: Every day | ORAL | 0 refills | Status: DC

## 2020-09-03 MED ORDER — TRAZODONE HCL 50 MG PO TABS: 50.0000 mg | ORAL_TABLET | Freq: Every evening | ORAL | 0 refills | Status: DC | PRN

## 2020-09-03 MED ORDER — HYDROXYZINE HCL 10 MG PO TABS: 10.0000 mg | ORAL_TABLET | Freq: Three times a day (TID) | ORAL | 0 refills | Status: DC | PRN
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
## 2020-09-03 NOTE — Progress Notes (Signed)
  [facility] Adult Case Management Discharge Plan :  Will you be returning to the same living situation after discharge:  Yes,  to home At discharge, do you have transportation home?: Yes,  mother to pick this patient up Do you have the ability to pay for your medications: No. Samples to be provided at discharge  Release of information consent forms completed and in the chart;  Patient's signature needed at discharge.  Patient to Follow up at:  Follow-up Information     [facility] Follow up.   Specialty: Behavioral Health Why: Please go to this provider for therapy and medication management services during the walk in hours:  Monday through Wednesday, from 8:00 am to 11:00 am.  Walk in services are first come, first served.  Contact information: [address] [place] [address] [phone]        [facility], [facility]. Go to.   Why: Please go to this provider for therapy and medication management services during the walk in hours for new patients:  Monday through Friday, from 9:00 am to 2:00 pm. (This provider may offer evening and/or Virtual appointments after assessment) Contact information: [address] [place] [address] [phone]                 Next level of care provider has access to [facility] Link:no  Safety Planning and Suicide Prevention discussed: Yes,  with friend  Have you used any form of tobacco in the last 30 days? (Cigarettes, Smokeless Tobacco, Cigars, and/or Pipes): No  Has patient been referred to the Quitline?: N/A patient is not a smoker  Patient has been referred for addiction treatment: N/A  Jade Santee, LCSW 09/03/2020, 10:42 AM

## 2020-09-03 NOTE — Progress Notes (Signed)
Adult Psychoeducational Group Note  Date:  09/03/2020 Time:  11:09 AM  Group Topic/Focus:  Goals Group:   The focus of this group is to help patients establish daily goals to achieve during treatment and discuss how the patient can incorporate goal setting into their daily lives to aide in recovery.  Participation Level:  Active  Participation Quality:  Appropriate and Attentive  Affect:  Appropriate  Cognitive:  Alert and Appropriate  Insight: Appropriate and Good  Engagement in Group:  Engaged  Modes of Intervention:  Discussion  Additional Comments:  Pt wants   Jade [name] 09/03/2020, 11:09 AM

## 2020-09-03 NOTE — Progress Notes (Signed)
Discharge Note:  Patient denies SI/HI AVH at this time. Discharge instructions, AVS, prescriptions and transition record gone over with patient. Patient agrees to comply with medication management, follow-up visit, and outpatient therapy. Patient belongings returned to patient. Patient questions and concerns addressed and answered.  Patient ambulatory off unit.  Patient discharged to home.   
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
## 2020-09-07 ENCOUNTER — Other Ambulatory Visit: Payer: Self-pay

## 2020-09-07 ENCOUNTER — Encounter (HOSPITAL_COMMUNITY): Payer: Self-pay | Admitting: Psychiatry

## 2020-09-07 ENCOUNTER — Telehealth (INDEPENDENT_AMBULATORY_CARE_PROVIDER_SITE_OTHER): Payer: No Payment, Other | Admitting: Psychiatry

## 2020-09-07 DIAGNOSIS — F419 Anxiety disorder, unspecified: Secondary | ICD-10-CM

## 2020-09-07 DIAGNOSIS — F4310 Post-traumatic stress disorder, unspecified: Secondary | ICD-10-CM

## 2020-09-07 DIAGNOSIS — F3181 Bipolar II disorder: Secondary | ICD-10-CM

## 2020-09-07 MED ORDER — ESCITALOPRAM OXALATE 20 MG PO TABS: 20.0000 mg | ORAL_TABLET | Freq: Every day | ORAL | 0 refills | Status: DC

## 2020-09-07 MED ORDER — LAMOTRIGINE 100 MG PO TABS: 100.0000 mg | ORAL_TABLET | Freq: Every day | ORAL | 0 refills | Status: DC

## 2020-09-07 MED ORDER — HYDROXYZINE HCL 10 MG PO TABS
10.0000 mg | ORAL_TABLET | Freq: Three times a day (TID) | ORAL | 0 refills | Status: DC | PRN
Start: 2020-09-07 — End: 2020-10-02

## 2020-09-07 NOTE — Progress Notes (Signed)
BH MD/PA/NP OP Progress Note  Virtual Visit via Telephone Note  I connected with Jade [name] on 09/07/20 at 11:30 AM EDT by telephone and verified that I am speaking with the correct person using two identifiers.  Location: Patient: home Provider: Clinic   I discussed the limitations, risks, security and privacy concerns of performing an evaluation and management service by telephone and the availability of in person appointments. I also discussed with the patient that there may be a patient responsible charge related to this service. The patient expressed understanding and agreed to proceed.   I provided 17 minutes of non-face-to-face time during this encounter.    09/07/2020 1:46 PM Jade [name]  MRN:  [id number]  Chief Complaint: " I feel better but still have good days and bad days."  HPI: Pt was recently admitted to [facility] from June 6 to 9 for worsening depression and suicidal ideations. She was discharged with adjustments in her med doses.  She was discharged home on Lamictal 100 mg daily and Lexapro 20 mg daily along with hydroxyzine 10 mg 3 times daily as needed.  Today, patient reported that she is feeling a little better.  She stated that the biggest stressor that resulted in her admission was her job situation.  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  She stated that the other trigger that has been bothering her is her mother.  She informed that her mother lives in the same apartment complex as she does.  She stated that her mother is very needy and is constantly asking for something.  She stated that her mother keeps pushing her to her limits.  She informed that on the day of her discharge her mother called to check on her and then asked what was she cooking for dinner. Patient stated that she lives with her 41 sons 68 years old and 13 years old.  Her 61 year old son works and is doing okay  however her 43 year old is disabled and has special needs.  She stated that she feels guilty for not being able to do things for him the way she should be.  Her son's father has been involved and he is willing to help whenever she needs him to help. She stated that she would like to restart therapy with Ms. Idalia Needle.  She has started looking for other job opportunities something where she does not have to deal with a lot of people every day.  Patient has not had any suicidal ideations after she came home and is hoping that she will continue to feel and things will improve for her in the near future.    Visit Diagnosis:    ICD-10-CM   1. Bipolar 2 disorder, major depressive episode ([name])  F31.81 escitalopram (LEXAPRO) 20 MG tablet    lamoTRIgine (LAMICTAL) 100 MG tablet    2. Anxiety  F41.9 escitalopram (LEXAPRO) 20 MG tablet    hydrOXYzine (ATARAX/VISTARIL) 10 MG tablet    3. PTSD (post-traumatic stress disorder)  F43.10 escitalopram (LEXAPRO) 20 MG tablet      Past Psychiatric History: Bipolar 2 d/o, anxiety  Past Medical History:  Past Medical History:  Diagnosis Date   Abnormal Pap smear of cervix    Anxiety    Depression    Vitamin D deficiency 04/2019    Past Surgical History:  Procedure Laterality Date   CERVICAL BIOPSY  W/ LOOP ELECTRODE EXCISION  03-2013   CESAREAN SECTION     x 1   COLPOSCOPY     TUBAL LIGATION      Family Psychiatric History: Mom- depression, anxiety  Family History:  Family History  Problem Relation Age of Onset   Hypertension Mother    Diabetes Mother    Hyperlipidemia Brother    Pancreatic cancer Maternal Grandmother    Diabetes Maternal Grandmother     Social History:  Social History   Socioeconomic History   Marital status: Single    Spouse name: Not on file   Number of children: 2   Years of education: 14   Highest education level: Some college, no degree  Occupational History   Not on file  Tobacco Use   Smoking status: Never    Smokeless tobacco: Never  Vaping Use   Vaping Use: Never used  Substance and Sexual Activity   Alcohol use: No    Comment: socially    Drug use: No   Sexual activity: Not Currently    Birth control/protection: Surgical  Other Topics Concern   Not on file  Social History Narrative   Marital status: single; dating seriously x 7 years.      Children:  2 children (14, 6)      Lives: with two sons      Employment:  Conservation officer, nature at [employer]; first shift for 8 years.      Tobacco: none      Alcohol:  4-6 drinks per month      Drugs: none      Exercise:  Sporadic; twice weekly; walking      Seatbelt: 100%      Guns: none      Sexual activity:  Total partners = 6; history of Chlamydia in 2002; last STD screening 2016; males only   Social Determinants of Health   Financial Resource Strain: Not on file  Food Insecurity: Not on file  Transportation Needs: Not on file  Physical Activity: Not on file  Stress: Not on file  Social Connections: Not on file    Allergies: No Known Allergies  Metabolic Disorder Labs: Lab Results  Component Value Date   HGBA1C 6.3 (H) 08/31/2020   MPG 134 08/31/2020   MPG 120 (H) 11/17/2014   No results found for: PROLACTIN Lab Results  Component Value Date   CHOL 195 08/31/2020   TRIG 92 08/31/2020   HDL 49 08/31/2020   CHOLHDL 4.0 08/31/2020   VLDL 18 08/31/2020   LDLCALC 128 (H) 08/31/2020   LDLCALC 113 (H) 05/24/2019   Lab Results  Component Value Date   TSH 1.315 08/31/2020   TSH 0.572 05/24/2019    Therapeutic Level Labs: No results found for: LITHIUM No results found for: VALPROATE No components found for:  CBMZ  Current Medications: Current Outpatient Medications  Medication Sig Dispense Refill   escitalopram (LEXAPRO) 20 MG tablet Take 1 tablet (20 mg total) by mouth daily. For depression 30 tablet 0   hydrOXYzine (ATARAX/VISTARIL) 10 MG tablet Take 1 tablet (10 mg total) by mouth 3 (three) times daily as needed for anxiety. 75  tablet 0   lamoTRIgine (LAMICTAL) 100 MG tablet Take 1 tablet (100 mg total) by mouth daily. For mood stabilization 30 tablet 0   traZODone (DESYREL) 50 MG tablet Take 1 tablet (50 mg total) by mouth at bedtime as needed for sleep. 30 tablet 0   No current facility-administered medications for this visit.     Psychiatric Specialty Exam:  Review of Systems  There were no vitals taken for this visit.There is no height or weight on file to calculate BMI.  General Appearance: Fairly Groomed  Eye Contact:  Good  Speech:  Clear and Coherent and Normal Rate  Volume:  Normal  Mood:  Euthymic  Affect:  Congruent  Thought Process:  Goal Directed and Descriptions of Associations: Intact  Orientation:  Full (Time, Place, and Person)  Thought Content: Logical   Suicidal Thoughts:  No  Homicidal Thoughts:  No  Memory:  Immediate;   Good Recent;   Good  Judgement:  Fair  Insight:  Fair  Psychomotor Activity:  Normal  Concentration:  Concentration: Good and Attention Span: Good  Recall:  Good  Fund of Knowledge: Good  Language: Good  Akathisia:  Negative  Handed:  Right  AIMS (if indicated): not done  Assets:  Communication Skills Desire for Improvement Financial Resources/Insurance Housing  ADL's:  Intact  Cognition: WNL  Sleep:  Fair   Screenings: GAD-7    Flowsheet Row [facility]  Total GAD-7 Score 6 16      PHQ2-9    Flowsheet Row [facility] Visit from 08[facility]01/2019 [facility] Office [facility]  PHQ-2 Total Score 2 6 3 4  0  PHQ-9 Total Score 8 20 15 18  --      Flowsheet Row Video [facility]  [facility] Most recent reading at 09/07/2020  1:45 PM Admission (Discharged) from [facility] Most recent reading at 08/31/2020  5:44 PM [facility] Most recent reading at 08/31/2020  2:27 PM  C-SSRS RISK CATEGORY Error: Q3, 4, or 5 should not be populated when Q2 is No Error: Q3, 4, or 5 should not be populated when Q2 is No Error: Q3, 4, or 5 should not be populated when Q2 is No        Assessment and Plan: 45 year old female recently hospitalized for worsening depression and suicidal ideations, now seen for follow-up after discharge.  Her medication doses were adjusted during her hospital stay.  Patient is doing slightly better however is still stressed out about her job situation.  She started looking for a new job position.  1. Bipolar 2 disorder, major depressive episode ([name])  - escitalopram (LEXAPRO) 20 MG tablet; Take 1 tablet (20 mg total) by mouth daily. For depression  Dispense: 30 tablet; Refill: 0 - lamoTRIgine (LAMICTAL) 100 MG tablet; Take 1 tablet (100 mg total) by mouth daily. For mood stabilization  Dispense: 30 tablet; Refill: 0  2. Anxiety  - escitalopram (LEXAPRO) 20 MG tablet; Take 1 tablet (20 mg total) by mouth daily. For depression  Dispense: 30 tablet; Refill: 0 - hydrOXYzine (ATARAX/VISTARIL) 10 MG tablet; Take 1 tablet (10 mg total) by mouth 3 (three) times daily as needed for anxiety.  Dispense: 75 tablet; Refill: 0  3. PTSD (post-traumatic stress disorder)  - escitalopram (LEXAPRO) 20 MG tablet; Take 1 tablet (20 mg total) by mouth daily. For depression  Dispense: 30 tablet; Refill: 0   Continue same regimen.  Patient continues to report worsening of depression or  partial improvement in her symptoms then may consider adding an augmenting agent such as Wellbutrin or Abilify to her regimen for optimal effects. Writer reached  out to her therapist Ms. Paige to see if she could be seen soon for follow-up. F/up in 2 months. Patient was informed that her care is being transferred to a different provider as the writer is leaving the office.  She verbalized her understanding and wished the writer best.  Zena Amos, MD 09/07/2020, 1:46 PM

## 2020-09-25 ENCOUNTER — Other Ambulatory Visit (HOSPITAL_COMMUNITY): Payer: Self-pay | Admitting: Psychiatry

## 2020-09-25 DIAGNOSIS — F4310 Post-traumatic stress disorder, unspecified: Secondary | ICD-10-CM

## 2020-09-25 DIAGNOSIS — F3181 Bipolar II disorder: Secondary | ICD-10-CM

## 2020-09-25 DIAGNOSIS — F419 Anxiety disorder, unspecified: Secondary | ICD-10-CM

## 2020-10-02 ENCOUNTER — Telehealth (INDEPENDENT_AMBULATORY_CARE_PROVIDER_SITE_OTHER): Payer: No Payment, Other | Admitting: Psychiatry

## 2020-10-02 ENCOUNTER — Encounter (HOSPITAL_COMMUNITY): Payer: Self-pay | Admitting: Psychiatry

## 2020-10-02 DIAGNOSIS — F3181 Bipolar II disorder: Secondary | ICD-10-CM | POA: Diagnosis not present

## 2020-10-02 DIAGNOSIS — F419 Anxiety disorder, unspecified: Secondary | ICD-10-CM

## 2020-10-02 DIAGNOSIS — F4310 Post-traumatic stress disorder, unspecified: Secondary | ICD-10-CM

## 2020-10-02 MED ORDER — LAMOTRIGINE 100 MG PO TABS: 100.0000 mg | ORAL_TABLET | Freq: Every day | ORAL | 2 refills | Status: DC

## 2020-10-02 MED ORDER — ESCITALOPRAM OXALATE 20 MG PO TABS: 20.0000 mg | ORAL_TABLET | Freq: Every day | ORAL | 2 refills | Status: DC

## 2020-10-02 MED ORDER — TRAZODONE HCL 50 MG PO TABS: 50.0000 mg | ORAL_TABLET | Freq: Every evening | ORAL | 2 refills | Status: DC | PRN

## 2020-10-02 MED ORDER — HYDROXYZINE HCL 10 MG PO TABS: 10.0000 mg | ORAL_TABLET | Freq: Three times a day (TID) | ORAL | 2 refills | Status: DC | PRN

## 2020-10-02 NOTE — Progress Notes (Signed)
BH MD/PA/NP OP Progress Note Virtual Visit via Video Note  I connected with Jade [name] on 10/02/20 at  8:30 AM EDT by a video enabled telemedicine application and verified that I am speaking with the correct person using two identifiers.  Location: Patient: Home Provider: Clinic   I discussed the limitations of evaluation and management by telemedicine and the availability of in person appointments. The patient expressed understanding and agreed to proceed.  I provided 30 minutes of non-face-to-face time during this encounter.   10/02/2020 8:46 AM Jade [name]  MRN:  [id number]  Chief Complaint: "The Lexapro makes me sleepy"  HPI: 45 year old female seen today for follow up psychiatry evaluation. She is a former patient of Dr. Quintella Baton who is being transferred to writer for medication management. She has a psychiatric history of PTSD, Depression, Bipolar 2, Anxiety, and SI. She currently managed on Lamictal 100 mg daily, Trazodone 50 mg nightly as needed, Lexapro 20 mg daily, and hydroxyzine 10 mg 3 times daily as needed. She notes her medications are effective in managing her psychiatric conditions.    Today, she is pleasant, cooperative, engaged in conversation, and maintained eye contact. She informed Clinical research associate that over all she is doing well but notes her Lexapro makes her sleepy. Patient notes that she has minimal anxiety and depression. Today provider conducted a GAD-7 and patient scored a 4. Provider also conducted a PHQ-9 and patient scored a 5. She notes that her main stressors are her 64 year old son and her mother who she notes takes advantage of her at times. She notes her son works and does not contribute to the house hold. Patient also has a 30 year old son who she notes is doing well. Today she notes her mood is stable on Lamictal. She denies SI/HI/VAH, mania, or paranoia. She endorses adequate appetite however notes within the last 3 months she has gained 12 pounds.  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 She notes she likes her position however notes at times it is stressful. 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 She however notes that she appreciates her current position because she is on short term disability and they are treating her well.  No medication changes made today. Patient will take lexapro at night due to sedation. She will continue all other medications as prescribes. She will follow up with outpatient counseling for therapy. No other concerns noted at this time.    Visit Diagnosis:    ICD-10-CM   1. Bipolar 2 disorder, major depressive episode ([name])  F31.81 escitalopram (LEXAPRO) 20 MG tablet    lamoTRIgine (LAMICTAL) 100 MG tablet    2. Anxiety  F41.9 escitalopram (LEXAPRO) 20 MG tablet    hydrOXYzine (ATARAX/VISTARIL) 10 MG tablet    3. PTSD (post-traumatic stress disorder)  F43.10 escitalopram (LEXAPRO) 20 MG tablet      Past Psychiatric History: PTSD, Depression, Bipolar 2, Anxiety, and SI  Past Medical History:  Past Medical History:  Diagnosis Date   Abnormal Pap smear of cervix    Anxiety    Depression    Vitamin D deficiency 04/2019    Past Surgical History:  Procedure Laterality Date   CERVICAL BIOPSY  W/ LOOP ELECTRODE EXCISION  03-2013   CESAREAN SECTION     x 1   COLPOSCOPY     TUBAL LIGATION  Family Psychiatric History:  Mom- depression, anxiety  Family History:  Family History  Problem Relation Age of Onset   Hypertension Mother    Diabetes Mother    Hyperlipidemia Brother    Pancreatic cancer Maternal Grandmother    Diabetes Maternal Grandmother     Social History:  Social History   Socioeconomic History   Marital status: Single    Spouse name: Not on file   Number of children: 2   Years of education: 14   Highest education level: Some college, no degree  Occupational History   Not on  file  Tobacco Use   Smoking status: Never   Smokeless tobacco: Never  Vaping Use   Vaping Use: Never used  Substance and Sexual Activity   Alcohol use: No    Comment: socially    Drug use: No   Sexual activity: Not Currently    Birth control/protection: Surgical  Other Topics Concern   Not on file  Social History Narrative   Marital status: single; dating seriously x 7 years.      Children:  2 children (14, 6)      Lives: with two sons      Employment:  Conservation officer, nature at [employer]; first shift for 8 years.      Tobacco: none      Alcohol:  4-6 drinks per month      Drugs: none      Exercise:  Sporadic; twice weekly; walking      Seatbelt: 100%      Guns: none      Sexual activity:  Total partners = 6; history of Chlamydia in 2002; last STD screening 2016; males only   Social Determinants of Health   Financial Resource Strain: Not on file  Food Insecurity: Not on file  Transportation Needs: Not on file  Physical Activity: Not on file  Stress: Not on file  Social Connections: Not on file    Allergies: No Known Allergies  Metabolic Disorder Labs: Lab Results  Component Value Date   HGBA1C 6.3 (H) 08/31/2020   MPG 134 08/31/2020   MPG 120 (H) 11/17/2014   No results found for: PROLACTIN Lab Results  Component Value Date   CHOL 195 08/31/2020   TRIG 92 08/31/2020   HDL 49 08/31/2020   CHOLHDL 4.0 08/31/2020   VLDL 18 08/31/2020   LDLCALC 128 (H) 08/31/2020   LDLCALC 113 (H) 05/24/2019   Lab Results  Component Value Date   TSH 1.315 08/31/2020   TSH 0.572 05/24/2019    Therapeutic Level Labs: No results found for: LITHIUM No results found for: VALPROATE No components found for:  CBMZ  Current Medications: Current Outpatient Medications  Medication Sig Dispense Refill   escitalopram (LEXAPRO) 20 MG tablet Take 1 tablet (20 mg total) by mouth daily. For depression 90 tablet 2   hydrOXYzine (ATARAX/VISTARIL) 10 MG tablet Take 1 tablet (10 mg total) by mouth 3  (three) times daily as needed for anxiety. 90 tablet 2   lamoTRIgine (LAMICTAL) 100 MG tablet Take 1 tablet (100 mg total) by mouth daily. For mood stabilization 30 tablet 2   traZODone (DESYREL) 50 MG tablet Take 1 tablet (50 mg total) by mouth at bedtime as needed for sleep. 30 tablet 2   No current facility-administered medications for this visit.     Musculoskeletal: Strength & Muscle Tone:  Unable to assess due to telehealth visit Gait & Station:  Unable to assess due to telehealth visit Patient leans: N/A  Psychiatric Specialty Exam: Review of Systems  There were no vitals taken for this visit.There is no height or weight on file to calculate BMI.  General Appearance: Well Groomed  Eye Contact:  Good  Speech:  Clear and Coherent and Normal Rate  Volume:  Normal  Mood:  Euthymic  Affect:  Appropriate and Congruent  Thought Process:  Coherent, Goal Directed, and NA  Orientation:  Full (Time, Place, and Person)  Thought Content: WDL and Logical   Suicidal Thoughts:  No  Homicidal Thoughts:  No  Memory:  Immediate;   Good Recent;   Good Remote;   Good  Judgement:  Good  Insight:  Good  Psychomotor Activity:  Normal  Concentration:  Concentration: Good and Attention Span: Good  Recall:  Good  Fund of Knowledge: Good  Language: Good  Akathisia:  No  Handed:  Right  AIMS (if indicated): not done  Assets:  Communication Skills Desire for Improvement Financial Resources/Insurance Housing Leisure Time Social Support  ADL's:  Intact  Cognition: WNL  Sleep:  Fair   Screenings: GAD-7    Flowsheet Row Video Visit from 10/02/2020 in [facility] Video Visit from 09/07/2020 in [facility] Counselor from 12/13/2019 [facility]  Total GAD-7 Score 4 6 16       PHQ2-9    Flowsheet Row Video Visit [facility] 10/02/2020 in [facility] Video Visit from 09/07/2020 in  [facility] ED from 08/31/2020 in [facility] Counselor from 12/13/2019 in [facility] Office Visit from 08/01/2019 in [facility]  PHQ-2 Total Score 1 2 6 3 4   PHQ-9 Total Score 5 8 20 15 18       Flowsheet Row Video Visit from 10/02/2020 in [facility] Video Visit from 09/07/2020 in [facility]Discharged[facility] from [facility]  C-SSRS RISK CATEGORY Error: Question 6 not populated Error: Q3, 4, or 5 should not be populated when Q2 is No Error: Q3, 4, or 5 should not be populated when Q2 is No        Assessment and Plan: Patient endorses symptoms of anxiety and depression related to life stressors however notes it is minimal and she can cope with it. Patient notes that at times her Lexapro make her sleepy. Today she will take her Lexapro at night. She will continue all other medications as prescribed.  1. Bipolar 2 disorder, major depressive episode ([name])  Continue- escitalopram (LEXAPRO) 20 MG tablet; Take 1 tablet (20 mg total) by mouth daily. For depression  Dispense: 90 tablet; Refill: 2 Continue- lamoTRIgine (LAMICTAL) 100 MG tablet; Take 1 tablet (100 mg total) by mouth daily. For mood stabilization  Dispense: 30 tablet; Refill: 2  2. Anxiety  Continue- escitalopram (LEXAPRO) 20 MG tablet; Take 1 tablet (20 mg total) by mouth daily. For depression  Dispense: 90 tablet; Refill: 2 Continue- hydrOXYzine (ATARAX/VISTARIL) 10 MG tablet; Take 1 tablet (10 mg total) by mouth 3 (three) times daily as needed for anxiety.  Dispense: 90 tablet; Refill: 2  3. PTSD (post-traumatic stress disorder)  Continue- escitalopram (LEXAPRO) 20 MG tablet; Take 1 tablet (20 mg total) by mouth daily. For depression  Dispense: 90 tablet; Refill: 2  Follow up in 3 months Follow up with  therapy  09/09/2020, NP 10/02/2020, 8:46 AM

## 2020-10-27 ENCOUNTER — Telehealth (HOSPITAL_COMMUNITY): Payer: Self-pay | Admitting: Psychiatry

## 2020-10-27 NOTE — Telephone Encounter (Signed)
Orlinda Blalock at [employer] [phone] X [phone], called checking status of FMLA forms faxed 09/29/20. She reports "Selena Batten" called her to confirm pt DOB before giving to provider.  She is faxing again today for provider to complete.

## 2020-10-27 NOTE — Telephone Encounter (Signed)
Received FMLA forms via fax. Placed forms in nurse folder for review.

## 2020-10-27 NOTE — Telephone Encounter (Signed)
Forms were originally faxed to [facility] on 09/29/20.  Patient states on 09/03/20 her boss text her and told her she was putting her out on FMLA. However, the patient is still out of work stating Evelene Croon and hospital provider said she needed addition therapies. Call pt if for any additional info [phone].

## 2020-10-27 NOTE — Telephone Encounter (Signed)
Provider spoke with patient who notes that she continues to be out of work.  She informed Clinical research associate that she feels overwhelmed and anxious at times.  She notes that her job is requiring her FMLA paperwork completed to determine when she can return to work.  Provider recommended patient going back to work on 11/27/2020.  She was agreeable to this and she notes currently she is not fully mentally stable.  She notes while at work she has difficulty concentrating, answering phones, and processing orders.  At this time medications not readjusted.  Patient was referred to outpatient counseling for therapy which she will receive on 11/02/2020.  No other concerns noted at this time.

## 2020-10-30 ENCOUNTER — Other Ambulatory Visit (HOSPITAL_COMMUNITY): Payer: Self-pay | Admitting: Psychiatry

## 2020-10-30 DIAGNOSIS — F3181 Bipolar II disorder: Secondary | ICD-10-CM

## 2020-11-01 ENCOUNTER — Other Ambulatory Visit (HOSPITAL_COMMUNITY): Payer: Self-pay | Admitting: Psychiatry

## 2020-11-01 DIAGNOSIS — F3181 Bipolar II disorder: Secondary | ICD-10-CM

## 2020-11-01 DIAGNOSIS — F419 Anxiety disorder, unspecified: Secondary | ICD-10-CM

## 2020-11-02 ENCOUNTER — Ambulatory Visit (INDEPENDENT_AMBULATORY_CARE_PROVIDER_SITE_OTHER): Payer: No Payment, Other | Admitting: Clinical

## 2020-11-02 ENCOUNTER — Other Ambulatory Visit: Payer: Self-pay

## 2020-11-02 DIAGNOSIS — F3181 Bipolar II disorder: Secondary | ICD-10-CM | POA: Diagnosis not present

## 2020-11-02 NOTE — Progress Notes (Signed)
   THERAPIST PROGRESS NOTE  Session Time: 50 minutes  Participation Level: Active  Behavioral Response: CasualAlertDepressed  Type of Therapy: Individual Therapy  Treatment Goals addressed: Coping  Interventions: CBT and Supportive  Summary:  Jade [name] is a 45 y.o. female who presents for the scheduled session oriented x5, appropriately dressed, and friendly.  Client denies hallucinations and delusions. Client reported on today she has been struggling with depression and worry.  Client reported since she was last seen she has been out on short-term disability following her inpatient treatment back in June 2022 related to bipolar disorder.  Client reported her medication management has been going well.  Client reported her main stressor is the anticipation of going back to her job.  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  Client reported she has no other support for herself and is slowly providing for her family.  Client reported she has experienced an professionalism and mistreatment from her upper management in regards to her payee and the amount of work that she does for her position.  Client reported working from 9 AM to 6 PM Monday through Friday and she uses her time after work and on the weekends to decompress.  Client reported she does not do much for herself outside of staying in the house in regards to self-care.  Client reported that she has been putting in applications for other job positions and discussed that regardless she will make a way to schedule a follow-up interview if the opportunity presents itself.    Suicidal/Homicidal: Nowithout intent/plan  Therapist Response:  Therapist began the session by asking the client how she has been doing since last seen. Therapist used CBT to utilize active listening, eye contact, and positive emotional support towards her thoughts and  feelings. Therapist used CBT to normalize the clients emotions. Therapist used CBT to engage with the client to challenge her anxious perspective by discussing a more balanced and reality based perspective to help problem solve her stressors. Therapist assigned client homework to develop more positive based cognitive messages, added her resume to to reflect the tasks she has done while on the job, and continue applying to jobs. Client was scheduled for next appointment.    Plan: Return again in 5 weeks.  Diagnosis: Bipolar 2 disorder, major depressive episode   Neena Rhymes Chez Bulnes, LCSW 11/02/2020

## 2020-11-20 ENCOUNTER — Telehealth (HOSPITAL_COMMUNITY): Payer: Self-pay | Admitting: *Deleted

## 2020-11-20 NOTE — Telephone Encounter (Signed)
Pt.called late on Friday requesting to speak with her provider, Ilda Foil DNP re her FMLA. She would like to ask her some questions. I will let Brittney know for Monday the 29 th when she returns to the office that Rebel would like a call.

## 2020-11-23 ENCOUNTER — Encounter (HOSPITAL_COMMUNITY): Payer: Self-pay | Admitting: Psychiatry

## 2020-11-23 NOTE — Telephone Encounter (Signed)
Patient notes that she will be returning to work after medical leave on Friday 11/27/2020.  She notes that her employer may not give her accommodations and request that provider write a letter regarding her mental health and how it relates to her concentration and inability to complete task due to times..  Provider was agreeable to this request and letter is currently in my chart.  No other concerns noted at this time.

## 2020-12-01 ENCOUNTER — Encounter (HOSPITAL_COMMUNITY): Payer: Self-pay | Admitting: Psychiatry

## 2020-12-01 ENCOUNTER — Telehealth (HOSPITAL_COMMUNITY): Payer: Self-pay | Admitting: *Deleted

## 2020-12-01 NOTE — Telephone Encounter (Signed)
Patient informed Clinical research associate that she returned back to work on Friday.  She informed Clinical research associate that her employers did not give her recommended breaks throughout the day.  She notes that they require provider to be more specific.  Writer rewrote recommendation letter and placed in my chart.  No other concerns noted at this time.

## 2020-12-01 NOTE — Telephone Encounter (Signed)
VM left on writers phone wanting to have Dr Doyne Keel call her back re her FMLA. Her phone number is [phone].

## 2020-12-02 ENCOUNTER — Telehealth (HOSPITAL_COMMUNITY): Payer: Self-pay | Admitting: *Deleted

## 2020-12-02 NOTE — Telephone Encounter (Signed)
Patients letter is in Mychart. If she has has trouble accessing it she can come to the clinic to have it printed off.

## 2020-12-02 NOTE — Telephone Encounter (Signed)
VM from patient asking if Dr Doyne Keel would please upload the letter she did for her in to my chart . Will make Dr Doyne Keel aware.

## 2020-12-07 ENCOUNTER — Encounter (HOSPITAL_COMMUNITY): Payer: Self-pay | Admitting: Psychiatry

## 2020-12-07 ENCOUNTER — Telehealth (HOSPITAL_COMMUNITY): Payer: Self-pay | Admitting: *Deleted

## 2020-12-07 NOTE — Telephone Encounter (Signed)
Patient informed Clinical research associate that her job is thinking about terminating her because she cannot complete task at work.  She notes that they wanted more specific details regarding her FMLA paperwork.  Provider placed letter in my chart.  No other concerns at this time.

## 2020-12-07 NOTE — Telephone Encounter (Signed)
Patient called stated that she has concerns/ issues about her FMLA paper work

## 2020-12-24 ENCOUNTER — Ambulatory Visit (HOSPITAL_COMMUNITY): Payer: No Payment, Other | Admitting: Clinical

## 2020-12-30 ENCOUNTER — Encounter (HOSPITAL_COMMUNITY): Payer: Self-pay | Admitting: Psychiatry

## 2020-12-30 ENCOUNTER — Telehealth (INDEPENDENT_AMBULATORY_CARE_PROVIDER_SITE_OTHER): Payer: No Payment, Other | Admitting: Psychiatry

## 2020-12-30 DIAGNOSIS — F4310 Post-traumatic stress disorder, unspecified: Secondary | ICD-10-CM

## 2020-12-30 DIAGNOSIS — F3181 Bipolar II disorder: Secondary | ICD-10-CM | POA: Diagnosis not present

## 2020-12-30 DIAGNOSIS — F419 Anxiety disorder, unspecified: Secondary | ICD-10-CM

## 2020-12-30 MED ORDER — HYDROXYZINE HCL 10 MG PO TABS: 10.0000 mg | ORAL_TABLET | Freq: Three times a day (TID) | ORAL | 3 refills | Status: DC | PRN

## 2020-12-30 MED ORDER — ESCITALOPRAM OXALATE 20 MG PO TABS: 20.0000 mg | ORAL_TABLET | Freq: Every day | ORAL | 3 refills | Status: DC

## 2020-12-30 MED ORDER — LAMOTRIGINE 100 MG PO TABS: 100.0000 mg | ORAL_TABLET | Freq: Every day | ORAL | 3 refills | Status: DC

## 2020-12-30 MED ORDER — TRAZODONE HCL 50 MG PO TABS: 50.0000 mg | ORAL_TABLET | Freq: Every evening | ORAL | 3 refills | Status: DC | PRN

## 2020-12-30 NOTE — Progress Notes (Signed)
BH MD/PA/NP OP Progress Note Virtual Visit via Video Note  I connected with Jade [name] on 12/30/20 at  8:30 AM EDT by a video enabled telemedicine application and verified that I am speaking with the correct person using two identifiers.  Location: Patient: Home Provider: Clinic   I discussed the limitations of evaluation and management by telemedicine and the availability of in person appointments. The patient expressed understanding and agreed to proceed.  I provided 30 minutes of non-face-to-face time during this encounter.   12/30/2020 9:21 AM Jade [name]  MRN:  [id number]  Chief Complaint: "Everything is okay"  HPI: 45 year old female seen today for follow up psychiatry evaluation.  She has a psychiatric history of PTSD, Depression, Bipolar 2, Anxiety, and SI. She currently managed on Lamictal 100 mg daily, Trazodone 50 mg nightly as needed, Lexapro 20 mg daily, and hydroxyzine 10 mg 3 times daily as needed. She notes her medications are effective in managing her psychiatric conditions.    Today, she is pleasant, cooperative, engaged in conversation, and maintained eye contact. She informed Clinical research associate that everything has been going well.  She notes that she continues to have minimal anxiety and depression and informed writer that her mood has been stable.  Provider conducted a GAD-7 and patient scored a 4, at her last visit she scored a 4.  Provider also conducted a PHQ-9 and patient scored a 4, at her last visit she scored a 5.  She notes that for the last 4 days her sleep has been off noting that she sleeps 4 to 5 hours.  She informed Clinical research associate that she has not been taking trazodone. She endorses adequate appetite however informed writer over the last 3 months she has gained 12 pounds.  Today she denies SI/HI/VH, mania, or paranoia.     Patient informed Clinical research associate that one stressor in her life include to losing her job.  She informed Clinical research associate that she cannot perform the job duties and was let  go.  She notes that she received a severance package, is currently looking for another position, and is receiving unemployment for now.    No medication changes made today. Patient will take lexapro at night due to sedation. She will continue all other medications as prescribes. She will follow up with outpatient counseling for therapy. No other concerns noted at this time.    Visit Diagnosis:    ICD-10-CM   1. Bipolar 2 disorder, major depressive episode ([name])  F31.81 escitalopram (LEXAPRO) 20 MG tablet    lamoTRIgine (LAMICTAL) 100 MG tablet    2. Anxiety  F41.9 escitalopram (LEXAPRO) 20 MG tablet    hydrOXYzine (ATARAX/VISTARIL) 10 MG tablet    3. PTSD (post-traumatic stress disorder)  F43.10 escitalopram (LEXAPRO) 20 MG tablet      Past Psychiatric History: PTSD, Depression, Bipolar 2, Anxiety, and SI  Past Medical History:  Past Medical History:  Diagnosis Date   Abnormal Pap smear of cervix    Anxiety    Depression    Vitamin D deficiency 04/2019    Past Surgical History:  Procedure Laterality Date   CERVICAL BIOPSY  W/ LOOP ELECTRODE EXCISION  03-2013   CESAREAN SECTION     x 1   COLPOSCOPY     TUBAL LIGATION      Family Psychiatric History:  Mom- depression, anxiety  Family History:  Family History  Problem Relation Age of Onset   Hypertension Mother    Diabetes Mother    Hyperlipidemia  Brother    Pancreatic cancer Maternal Grandmother    Diabetes Maternal Grandmother     Social History:  Social History   Socioeconomic History   Marital status: Single    Spouse name: Not on file   Number of children: 2   Years of education: 14   Highest education level: Some college, no degree  Occupational History   Not on file  Tobacco Use   Smoking status: Never   Smokeless tobacco: Never  Vaping Use   Vaping Use: Never used  Substance and Sexual Activity   Alcohol use: No    Comment: socially    Drug use: No   Sexual activity: Not Currently    Birth  control/protection: Surgical  Other Topics Concern   Not on file  Social History Narrative   Marital status: single; dating seriously x 7 years.      Children:  2 children (14, 6)      Lives: with two sons      Employment:  Conservation officer, nature at [employer]; first shift for 8 years.      Tobacco: none      Alcohol:  4-6 drinks per month      Drugs: none      Exercise:  Sporadic; twice weekly; walking      Seatbelt: 100%      Guns: none      Sexual activity:  Total partners = 6; history of Chlamydia in 2002; last STD screening 2016; males only   Social Determinants of Health   Financial Resource Strain: Not on file  Food Insecurity: Not on file  Transportation Needs: Not on file  Physical Activity: Not on file  Stress: Not on file  Social Connections: Not on file    Allergies: No Known Allergies  Metabolic Disorder Labs: Lab Results  Component Value Date   HGBA1C 6.3 (H) 08/31/2020   MPG 134 08/31/2020   MPG 120 (H) 11/17/2014   No results found for: PROLACTIN Lab Results  Component Value Date   CHOL 195 08/31/2020   TRIG 92 08/31/2020   HDL 49 08/31/2020   CHOLHDL 4.0 08/31/2020   VLDL 18 08/31/2020   LDLCALC 128 (H) 08/31/2020   LDLCALC 113 (H) 05/24/2019   Lab Results  Component Value Date   TSH 1.315 08/31/2020   TSH 0.572 05/24/2019    Therapeutic Level Labs: No results found for: LITHIUM No results found for: VALPROATE No components found for:  CBMZ  Current Medications: Current Outpatient Medications  Medication Sig Dispense Refill   escitalopram (LEXAPRO) 20 MG tablet Take 1 tablet (20 mg total) by mouth daily. For depression 90 tablet 3   hydrOXYzine (ATARAX/VISTARIL) 10 MG tablet Take 1 tablet (10 mg total) by mouth 3 (three) times daily as needed for anxiety. 90 tablet 3   lamoTRIgine (LAMICTAL) 100 MG tablet Take 1 tablet (100 mg total) by mouth daily. For mood stabilization 30 tablet 3   traZODone (DESYREL) 50 MG tablet Take 1 tablet (50 mg total) by mouth at  bedtime as needed for sleep. 30 tablet 3   No current facility-administered medications for this visit.     Musculoskeletal: Strength & Muscle Tone:  Unable to assess due to telehealth visit Gait & Station:  Unable to assess due to telehealth visit Patient leans: N/A  Psychiatric Specialty Exam: Review of Systems  There were no vitals taken for this visit.There is no height or weight on file to calculate BMI.  General Appearance: Well Groomed  Eye Contact:  Good  Speech:  Clear and Coherent and Normal Rate  Volume:  Normal  Mood:  Euthymic  Affect:  Appropriate and Congruent  Thought Process:  Coherent, Goal Directed, and NA  Orientation:  Full (Time, Place, and Person)  Thought Content: WDL and Logical   Suicidal Thoughts:  No  Homicidal Thoughts:  No  Memory:  Immediate;   Good Recent;   Good Remote;   Good  Judgement:  Good  Insight:  Good  Psychomotor Activity:  Normal  Concentration:  Concentration: Good and Attention Span: Good  Recall:  Good  Fund of Knowledge: Good  Language: Good  Akathisia:  No  Handed:  Right  AIMS (if indicated): not done  Assets:  Communication Skills Desire for Improvement Financial Resources/Insurance Housing Leisure Time Social Support  ADL's:  Intact  Cognition: WNL  Sleep:  Fair   Screenings: GAD-7    Flowsheet Row Video Visit from 12/30/2020 in [facility] Video Visit from 10/02/2020 in [facility] Video Visit from 09/07/2020 in [facility] Counselor from 12/13/2019 in [facility]  Total GAD-7 Score 4 4 6 16       PHQ2-9    Flowsheet Row Video Visit from 12/30/2020 in [facility] Video Visit from 10/02/2020 in [facility] Video Visit from 09/07/2020 in [facility] ED from 08/31/2020 in [facility] Counselor  from 12/13/2019 in [facility]  PHQ-2 Total Score 0 1 2 6 3   PHQ-9 Total Score 4 5 8 20 15       Flowsheet Row Video Visit from 10/02/2020 in [facility] Video Visit from 09/07/2020 in [facility]Dis[facility]  C-SSRS RISK CATEGORY Error: Question 6 not populated Error: Q3, 4, or 5 should not be populated when Q2 is No Error: Q3, 4, or 5 should not be populated when Q2 is No        Assessment and Plan: Patient endorses notes that she is doing well on her current medication regimen.  No medication changes made today.  Patient agreeable to continue medication as prescribed.  1. Bipolar 2 disorder, major depressive episode ([name])  Continue- escitalopram (LEXAPRO) 20 MG tablet; Take 1 tablet (20 mg total) by mouth daily. For depression  Dispense: 90 tablet; Refill: 2 Continue- lamoTRIgine (LAMICTAL) 100 MG tablet; Take 1 tablet (100 mg total) by mouth daily. For mood stabilization  Dispense: 30 tablet; Refill: 2  2. Anxiety  Continue- escitalopram (LEXAPRO) 20 MG tablet; Take 1 tablet (20 mg total) by mouth daily. For depression  Dispense: 90 tablet; Refill: 2 Continue- hydrOXYzine (ATARAX/VISTARIL) 10 MG tablet; Take 1 tablet (10 mg total) by mouth 3 (three) times daily as needed for anxiety.  Dispense: 90 tablet; Refill: 2  3. PTSD (post-traumatic stress disorder)  Continue- escitalopram (LEXAPRO) 20 MG tablet; Take 1 tablet (20 mg total) by mouth daily. For depression  Dispense: 90 tablet; Refill: 2  Follow up in 3 months Follow up with therapy  09/09/2020, NP 12/30/2020, 9:21 AM

## 2021-01-21 ENCOUNTER — Ambulatory Visit (HOSPITAL_COMMUNITY): Payer: Self-pay | Admitting: Clinical

## 2021-01-21 ENCOUNTER — Encounter (HOSPITAL_COMMUNITY): Payer: Self-pay

## 2021-04-01 ENCOUNTER — Encounter (HOSPITAL_COMMUNITY): Payer: Self-pay

## 2021-04-01 ENCOUNTER — Telehealth (HOSPITAL_COMMUNITY): Payer: No Payment, Other | Admitting: Psychiatry

## 2021-04-05 ENCOUNTER — Telehealth (HOSPITAL_COMMUNITY): Payer: Self-pay | Admitting: *Deleted

## 2021-04-05 ENCOUNTER — Other Ambulatory Visit (HOSPITAL_COMMUNITY): Payer: Self-pay | Admitting: Psychiatry

## 2021-04-05 DIAGNOSIS — F419 Anxiety disorder, unspecified: Secondary | ICD-10-CM

## 2021-04-05 DIAGNOSIS — F3181 Bipolar II disorder: Secondary | ICD-10-CM

## 2021-04-05 DIAGNOSIS — F4310 Post-traumatic stress disorder, unspecified: Secondary | ICD-10-CM

## 2021-04-05 MED ORDER — TRAZODONE HCL 50 MG PO TABS: 50.0000 mg | ORAL_TABLET | Freq: Every evening | ORAL | 3 refills | Status: DC | PRN

## 2021-04-05 MED ORDER — HYDROXYZINE HCL 10 MG PO TABS: 10.0000 mg | ORAL_TABLET | Freq: Three times a day (TID) | ORAL | 3 refills | Status: DC | PRN

## 2021-04-05 MED ORDER — LAMOTRIGINE 100 MG PO TABS: 100.0000 mg | ORAL_TABLET | Freq: Every day | ORAL | 3 refills | Status: DC

## 2021-04-05 MED ORDER — ESCITALOPRAM OXALATE 20 MG PO TABS: 20.0000 mg | ORAL_TABLET | Freq: Every day | ORAL | 3 refills | Status: DC

## 2021-04-05 NOTE — Telephone Encounter (Signed)
Patient called provider and apologized for missing her appointment.  She notes that she would call the front desk to reschedule her appointment.  At this time patient requesting med refills.  Provider refilled medication and sent to preferred pharmacy.  Patient notes that her mood is stable, reports that she has minimal anxiety/depression, and denies SI/HI/AVH, mania, or paranoia.  No other concerns at this time.

## 2021-04-05 NOTE — Telephone Encounter (Signed)
Patient LVM requesting call regarding some questions  & concerns about her condition

## 2021-05-03 ENCOUNTER — Telehealth (INDEPENDENT_AMBULATORY_CARE_PROVIDER_SITE_OTHER): Payer: Medicaid Other | Admitting: Psychiatry

## 2021-05-03 DIAGNOSIS — F4310 Post-traumatic stress disorder, unspecified: Secondary | ICD-10-CM | POA: Diagnosis not present

## 2021-05-03 DIAGNOSIS — F3181 Bipolar II disorder: Secondary | ICD-10-CM | POA: Diagnosis not present

## 2021-05-03 DIAGNOSIS — F419 Anxiety disorder, unspecified: Secondary | ICD-10-CM | POA: Diagnosis not present

## 2021-05-03 DIAGNOSIS — F319 Bipolar disorder, unspecified: Secondary | ICD-10-CM

## 2021-05-03 MED ORDER — BUPROPION HCL ER (XL) 150 MG PO TB24: 150.0000 mg | ORAL_TABLET | ORAL | 2 refills | Status: DC

## 2021-05-03 NOTE — Progress Notes (Addendum)
[place] MD/PA/NP OP Progress Note  05/04/2021 3:02 AM Jade [name]  MRN:  [id number]  Virtual Visit via Video Note  I connected with Jade [name] on 05/04/21 at  1:00 PM EST by a video enabled telemedicine application and verified that I am speaking with the correct person using two identifiers.  Location: Patient: home Provider: off-site   I discussed the limitations of evaluation and management by telemedicine and the availability of in person appointments. The patient expressed understanding and agreed to proceed.    I discussed the assessment and treatment plan with the patient. The patient was provided an opportunity to ask questions and all were answered. The patient agreed with the plan and demonstrated an understanding of the instructions.   The patient was advised to call back or seek an in-person evaluation if the symptoms worsen or if the condition fails to improve as anticipated.  I provided 30 minutes of non-face-to-face time during this encounter.   Franne Grip, NP   Chief Complaint:  HPI: Jade [name] is a 46 year old female presenting to [facility] for follow-up psychiatric evaluation and seen via virtual video visit. Patient reports increased fatigue. "I don't want to get out of bed". Patient has a history of PTSD, Bipolar II disorder, depression and anxiety. Patient is managed on Lamictal 100 mg, Trazodone 50 mg, Lexapro 20 mg daily, and hydroxyzine 10 mg 3 times daily as needed for anxiety. Patient reports that her antidepressant is ineffective and her mood has been depressed lately. She is compliant with her medication regimen and denies known adverse reactions.   Jade [name] is pleasant, alert and oriented x 4, and willing to engage. Patient reports being prescribed Tramadol and Robaxin by her PCP for back discomfort. Patient provided medication education of possible causes of increased fatigue and patient acknowledged understanding of which  medications can contribute to drowsiness. Patients reports adequate appetite, sleep and denies symptoms of anxiety. Patient stated "I have been asleep to much to be anxious". Jade [name] denies auditory or visual hallucinations or suicidal or homicidal ideations, or paranoia, or delusional thought.   Visit Diagnosis:    ICD-10-CM   1. Bipolar depression ([place])  F31.9     2. Bipolar 2 disorder, major depressive episode ([place])  F31.81     3. Anxiety  F41.9     4. PTSD (post-traumatic stress disorder)  F43.10       Past Medical History:  Past Medical History:  Diagnosis Date   Abnormal Pap smear of cervix    Anxiety    Depression    Vitamin D deficiency 04/2019    Past Surgical History:  Procedure Laterality Date   CERVICAL BIOPSY  W/ LOOP ELECTRODE EXCISION  03-2013   CESAREAN SECTION     x 1   COLPOSCOPY     TUBAL LIGATION      Family Psychiatric History: None known  Family History:  Family History  Problem Relation Age of Onset   Hypertension Mother    Diabetes Mother    Hyperlipidemia Brother    Pancreatic cancer Maternal Grandmother    Diabetes Maternal Grandmother     Social History:  Social History   Socioeconomic History   Marital status: Single    Spouse name: Not on file   Number of children: 2   Years of education: 14   Highest education level: Some college, no degree  Occupational History   Not on file  Tobacco Use   Smoking status: Never  Smokeless tobacco: Never  Vaping Use   Vaping Use: Never used  Substance and Sexual Activity   Alcohol use: No    Comment: socially    Drug use: No   Sexual activity: Not Currently    Birth control/protection: Surgical  Other Topics Concern   Not on file  Social History Narrative   Marital status: single; dating seriously x 7 years.      Children:  2 children (14, 6)      Lives: with two sons      Employment:  Scientist, water quality at [employer]; first shift for 8 years.      Tobacco: none      Alcohol:  4-6 drinks per  month      Drugs: none      Exercise:  Sporadic; twice weekly; walking      Seatbelt: 100%      Guns: none      Sexual activity:  Total partners = 6; history of Chlamydia in 2002; last STD screening 2016; males only   Social Determinants of Health   Financial Resource Strain: Not on file  Food Insecurity: Not on file  Transportation Needs: Not on file  Physical Activity: Not on file  Stress: Not on file  Social Connections: Not on file    Allergies: No Known Allergies  Metabolic Disorder Labs: Lab Results  Component Value Date   HGBA1C 6.3 (H) 08/31/2020   MPG 134 08/31/2020   MPG 120 (H) 11/17/2014   No results found for: PROLACTIN Lab Results  Component Value Date   CHOL 195 08/31/2020   TRIG 92 08/31/2020   HDL 49 08/31/2020   CHOLHDL 4.0 08/31/2020   VLDL 18 08/31/2020   LDLCALC 128 (H) 08/31/2020   LDLCALC 113 (H) 05/24/2019   Lab Results  Component Value Date   TSH 1.315 08/31/2020   TSH 0.572 05/24/2019    Therapeutic Level Labs: No results found for: LITHIUM No results found for: VALPROATE No components found for:  CBMZ  Current Medications: Current Outpatient Medications  Medication Sig Dispense Refill   buPROPion (WELLBUTRIN XL) 150 MG 24 hr tablet Take 1 tablet (150 mg total) by mouth every morning. 30 tablet 2   celecoxib (CELEBREX) 200 MG capsule Take 200 mg by mouth daily after breakfast.     methocarbamol (ROBAXIN) 750 MG tablet Take 750 mg by mouth 2 (two) times daily.     predniSONE (DELTASONE) 5 MG tablet Take 5 mg by mouth daily with breakfast.     Vitamin D, Ergocalciferol, (DRISDOL) 1.25 MG (50000 UNIT) CAPS capsule Take 50,000 Units by mouth every 7 (seven) days.     hydrOXYzine (ATARAX) 10 MG tablet Take 1 tablet (10 mg total) by mouth 3 (three) times daily as needed for anxiety. 90 tablet 3   lamoTRIgine (LAMICTAL) 100 MG tablet Take 1 tablet (100 mg total) by mouth daily. For mood stabilization 30 tablet 3   traMADol (ULTRAM) 50 MG  tablet Take 50 mg by mouth 3 (three) times daily as needed.     No current facility-administered medications for this visit.     Musculoskeletal: Strength & Muscle Tone: within normal limits Gait & Station: normal Patient leans: N/A  Psychiatric Specialty Exam: Review of Systems  Psychiatric/Behavioral:  Positive for dysphoric mood. Negative for behavioral problems, confusion, self-injury, sleep disturbance and suicidal ideas.   All other systems reviewed and are negative.  There were no vitals taken for this visit.There is no height or weight on file  to calculate BMI.  General Appearance: Casual  Eye Contact:  Good  Speech:  Clear and Coherent  Volume:  Normal  Mood:  Depressed  Affect:  Appropriate and Congruent  Thought Process:  Coherent  Orientation:  Full (Time, Place, and Person)  Thought Content: Logical   Suicidal Thoughts:  No  Homicidal Thoughts:  No  Memory:  Immediate;   Good Recent;   Good Remote;   Good  Judgement:  Good  Insight:  Good  Psychomotor Activity:  Normal  Concentration:  Concentration: Good and Attention Span: Good  Recall:  Good  Fund of Knowledge: Good  Language: Good  Akathisia:  Negative  Handed:  Right  AIMS (if indicated): not done  Assets:  Communication Skills Desire for Improvement Housing  ADL's:  Intact  Cognition: WNL  Sleep:  Good   Screenings: GAD-7    Flowsheet Row Video Visit from 12/30/2020 in [facility] Video Visit from 10/02/2020 in [facility] Video Visit from 09/07/2020 in [facility] Counselor from 12/13/2019 in [facility]  Total GAD-7 Score 4 4 6 16       PHQ2-9    Flowsheet Row Video Visit from 12/30/2020 in [facility] Video Visit from 10/02/2020 in [facility] Video Visit from 09/07/2020 in [facility] ED from  08/31/2020 in [facility] Counselor from 12/13/2019 in [facility]  PHQ-2 Total Score 0 1 2 6 3   PHQ-9 Total Score 4 5 8 20 15       Flowsheet Row Video Visit from 10/02/2020 in [facility] Video Visit from 09/07/2020 in [facility] Admission (Discharged) from 08/31/2020 in [place] Error: Question 6 not populated Error: Q3, 4, or 5 should not be populated when Q2 is No Error: Q3, 4, or 5 should not be populated when Q2 is No        Assessment and Plan: Jade [name] is a 46 year old female presenting to [facility] for follow up psychiatric evaluation. Patient reports depressed mood and increased fatigue, reporting that her antidepressant is ineffective. Medication reconciliation completed. Patient denied the need for Trazodone at this time and patient is also prescribed Tramadol by her PCP, which this medication combination may increase CNS depression. Trazodone discontinued during today's visit. Patient will wean off of Lexapro. New order for Wellbutrin XL 150 mg daily provided to manage symptoms. Patient reported visiting her PCP recently and completing labs, which results are still pending. Patient's medication e-scribed to her preferred pharmacy. Patient will return to care in six weeks or as needed if symptoms worsen.  Treatment/Medications:   Bipolar depression F31.9 Medications: Wellbutrin XL 150 mg daily Bipolar II disorder F31.81 Medication: Lamictal 100 mg daily Anxiety: F41.9 Medication: Hydroxyzine 10 mg three times daily as needed      Franne Grip, NP 05/04/2021, 3:02 AM

## 2021-05-04 ENCOUNTER — Encounter (HOSPITAL_COMMUNITY): Payer: Self-pay | Admitting: Psychiatry

## 2021-05-05 ENCOUNTER — Other Ambulatory Visit: Payer: Self-pay

## 2021-05-05 ENCOUNTER — Ambulatory Visit: Payer: Medicaid Other | Attending: Family Medicine

## 2021-05-05 DIAGNOSIS — M5442 Lumbago with sciatica, left side: Secondary | ICD-10-CM | POA: Diagnosis not present

## 2021-05-05 DIAGNOSIS — M6281 Muscle weakness (generalized): Secondary | ICD-10-CM | POA: Diagnosis present

## 2021-05-05 DIAGNOSIS — G8929 Other chronic pain: Secondary | ICD-10-CM

## 2021-05-05 DIAGNOSIS — R252 Cramp and spasm: Secondary | ICD-10-CM | POA: Diagnosis present

## 2021-05-05 NOTE — Therapy (Signed)
OUTPATIENT PHYSICAL THERAPY THORACOLUMBAR EVALUATION   Patient Name: Jade [name] MRN: [id number] DOB:01/22/1976, 46 y.o., female Today's Date: 05/06/2021   PT End of Session - 05/06/21 0837     Visit Number 1    Number of Visits 13    Date for PT Re-Evaluation 06/25/21    Authorization Type MEDICAID OF [place]    PT Start Time 0845    PT Stop Time 0930    PT Time Calculation (min) 45 min    Activity Tolerance Patient tolerated treatment well    Behavior During Therapy [facility] for tasks assessed/performed             Past Medical History:  Diagnosis Date   Abnormal Pap smear of cervix    Anxiety    Depression    Vitamin D deficiency 04/2019   Past Surgical History:  Procedure Laterality Date   CERVICAL BIOPSY  W/ LOOP ELECTRODE EXCISION  03-2013   CESAREAN SECTION     x 1   COLPOSCOPY     TUBAL LIGATION     Patient Active Problem List   Diagnosis Date Noted   PTSD (post-traumatic stress disorder) 09/01/2020   Bipolar depression ([name]) 08/31/2020   Bipolar 2 disorder, major depressive episode ([name]) 12/12/2019   Anxiety 12/12/2019   Hemoglobin A1c less than 7.0% 05/26/2019   Depression 05/26/2019   Chronic back pain 05/26/2019   Left knee pain 05/26/2019   Gastroesophageal reflux disease without esophagitis 03/25/2019   Menometrorrhagia 05/18/2017   Morbid obesity ([name]) 06/23/2014   High grade squamous intraepithelial cervical dysplasia 08/01/2011    PCP: Barbette Merino, NP  REFERRING PROVIDER: Ellender Hose, NP  REFERRING DIAG: Lumbar pan with radiation down legs  THERAPY DIAG:  Chronic bilateral low back pain with left-sided sciatica  Cramp and spasm  Muscle weakness (generalized)  ONSET DATE: 7.5. years ago  SUBJECTIVE:                                                                                                                                                                                           SUBJECTIVE STATEMENT: Pt reports having mid to  low back pain for approx 7.5 years that has become more consistent for 3-4 months without MOI. Pain has now extended to the hips and with pain to the lateral L LE to the ankle. Taking pain meds and muscle relaxors help some.  PERTINENT HISTORY:  Obesity, anxiety, depression  PAIN:  Are you having pain? Yes NPRS scale: 5/10, pain range is 3-8/10 Pain location: Mid to low back Pain orientation: Bilateral  PAIN TYPE: aching and sharp Pain description: constant  Aggravating factors: Standing and walking, greater than 15 mins. Relieving factors: Medications, sitting with pillows for back support  PRECAUTIONS: None  WEIGHT BEARING RESTRICTIONS No  FALLS:  Has patient fallen in last 6 months? No, Number of falls: 0  LIVING ENVIRONMENT: Lives with: lives with their family Lives in: House/apartment No issue with accessing home or mobility within home  OCCUPATION: Unemployed; Pt has filed for disability  PLOF: Independent  PATIENT GOALS To find things I can do to ease the pain and not rely on medications   OBJECTIVE:   DIAGNOSTIC FINDINGS:  Pt reports xrays at [facility] were negative  SCREENING FOR RED FLAGS: Bowel or bladder incontinence: No Cauda equina syndrome: No  COGNITION:  Overall cognitive status: Within functional limits for tasks assessed     SENSATION:  Light touch: Appears intact  MUSCLE LENGTH: Hamstrings: Right tight ; Left tight Thomas test: Right tight ; Left tight  POSTURE:  Forward head, increased lumbar lordosis, valgus deformity of LEs  PALPATION: TTP of the lumbar paraspinals  LUMBARAROM/PROM  A/PROM A/PROM  05/06/2021  Flexion Mod limited, pulling LBP, lordosis did not reverse  Extension Min limited, pressure LBP  Right lateral flexion Min, pulling LBP L  Left lateral flexion Min, pulling LBP R  Right rotation Full, no increase in LBP  Left rotation Min, pressure LBP L    LE AROM/PROM:   Grossly WNLs  LE MMT:  MMT  Right 05/06/2021 Left 05/06/2021  Hip flexion 4+ 4+  Hip extension 4+ 4+  Hip abduction 4+ 4+  Hip adduction 5 5  Hip internal rotation 4+ 4+  Hip external rotation 4+ 4+  Knee flexion 5 5  Knee extension 5 5  Ankle dorsiflexion    Ankle plantarflexion    Ankle inversion    Ankle eversion    - decreased strength of core  (Blank rows = not tested)  LUMBAR SPECIAL TESTS:  Straight leg raise test: Negative and Slump test: Negative  FUNCTIONAL TESTS:  5 times sit to stand: TBA  GAIT: Distance walked: 200' within clinic Assistive device utilized: None Level of assistance: Complete Independence Comments: gait pattern WNLs    TODAY'S TREATMENT  Seated Flexion Stretch with Swiss Ball 10 reps - 15 hold Supine Posterior Pelvic Tilt 10 reps - 3 hold Hooklying Single Knee to Chest 3 reps - 15 hold Modified Thomas Stretch - 2 reps - 15 hold   PATIENT EDUCATION:  Education details: Eval findings, POC, HEP, sleeping position and support for comfort Person educated: Patient Education method: Explanation, Demonstration, Tactile cues, Verbal cues, and Handouts Education comprehension: verbalized understanding, returned demonstration, verbal cues required, and tactile cues required   HOME EXERCISE PROGRAM: Access Code: [email or web address] URL: [email or web address] Date: 05/06/2021 Prepared by: Joellyn Rued  Exercises Seated Flexion Stretch with Swiss Ball - 3 x daily - 7 x weekly - 3 sets - 10 reps - 15 hold Supine Posterior Pelvic Tilt - 2 x daily - 7 x weekly - 2 sets - 10 reps - 3 hold Hooklying Single Knee to Chest - 2 x daily - 7 x weekly - 1 sets - 3 reps - 15 hold Modified Thomas Stretch - 2 x daily - 7 x weekly - 1 sets - 3 reps - 15 hold  CLINICAL IMPRESSION: Patient is a 46 y.o. F who was seen today for physical therapy evaluation and treatment for chronic LBP with occasional pain into the lateral aspect of her L LE sometimes to her foot. The  most significant finds  were increased lumbar lordosis which does not reverse c forward bending, obesity, and min decreased core strength. Objective impairments include difficulty walking, decreased ROM, decreased strength, increased muscle spasms, impaired flexibility, postural dysfunction, obesity, and pain. These impairments are limiting patient from cleaning, community activity, meal prep, laundry, yard work, and shopping. Personal factors including Past/current experiences, Time since onset of injury/illness/exacerbation, and 1-2 comorbidities: obesity and anxiety  are also affecting patient's functional outcome. Patient will benefit from skilled PT to address above impairments and improve overall function.  REHAB POTENTIAL: Good  CLINICAL DECISION MAKING: Stable/uncomplicated  EVALUATION COMPLEXITY: Low   GOALS:  SHORT TERM GOALS:  STG Name Target Date Goal status  1 Pt will be Ind in an initial HEP Baseline: Started on eval 05/30/21 INITIAL  2 Pt will voice understanding of measures to assist in the decrease and management of LBP Baseline: Started on eval 05/30/21 INITIAL  3 Pt will demonstrate proper body mechanics with ADLs Baseline: 05/30/21 INITIAL   LONG TERM GOALS:   LTG Name Target Date Goal status  1 Increase bilat hip strength to 5/5 and improve core strength for improved trunk stability  Baseline:4+/5 07/09/21 INITIAL  2 Increase low back ROM to min limitation c improve forward flexion and for hamstring and hip flexor flexibility Baseline:min to mod limitations 07/09/21 INITIAL  3 Pt will report a decrease in her LBP range to 5/10 or less and/or decrease in frequency Baseline:3-8/10 07/09/21 INITIAL  4 Pt will tolerate 30 mins or walking with a LBP of 5/10 or less Baseline:15 mins 8/10 07/09/21 INITIAL  5 Pt will be ind in a final HEP to maintain achieved LOF Baseline: 07/09/21 INITIAL   PLAN: PT FREQUENCY: 2x/week  PT DURATION: 6 weeks  PLANNED INTERVENTIONS: Therapeutic exercises, Therapeutic  activity, Neuro Muscular re-education, Balance training, Gait training, Patient/Family education, Joint mobilization, Aquatic Therapy, Dry Needling, Electrical stimulation, Spinal mobilization, Cryotherapy, Moist heat, Taping, Traction, Ultrasound, Ionotophoresis 4mg /ml Dexamethasone, and Manual therapy  PLAN FOR NEXT SESSION: Assess pt's response to HEP and education. Progress there ex as indicated. Use of manual therapy and/or modalities as indicated   MS, PT 05/06/21 9:21 AM  Check all possible CPT codes: 07/04/21- Therapeutic Exercise, [phone]- Neuro Re-education, [id number] - Gait Training, 97140 - Manual Therapy, 97530 - Therapeutic Activities, 97535 - Self Care, [phone] - Mechanical traction, 97014 - Electrical stimulation (unattended), 85885 - Electrical stimulation (Manual), [id number] - Iontophoresis, [id number] - Ultrasound, and [id number] - Aquatic therapy

## 2021-05-12 NOTE — Therapy (Signed)
OUTPATIENT PHYSICAL THERAPY TREATMENT NOTE   Patient Name: Jade [name] MRN: [id number] DOB:1975-05-13, 46 y.o., female Today's Date: 05/14/2021  PCP: Barbette Merino, NP REFERRING PROVIDER: Barbette Merino, NP   PT End of Session - 05/13/21 1154     Visit Number 2    Number of Visits 13    Date for PT Re-Evaluation 06/25/21    Authorization Type MEDICAID OF [place]    PT Start Time 1150    PT Stop Time 1230    PT Time Calculation (min) 40 min    Activity Tolerance Patient tolerated treatment well    Behavior During Therapy WFL for tasks assessed/performed             Past Medical History:  Diagnosis Date   Abnormal Pap smear of cervix    Anxiety    Depression    Vitamin D deficiency 04/2019   Past Surgical History:  Procedure Laterality Date   CERVICAL BIOPSY  W/ LOOP ELECTRODE EXCISION  03-2013   CESAREAN SECTION     x 1   COLPOSCOPY     TUBAL LIGATION     Patient Active Problem List   Diagnosis Date Noted   PTSD (post-traumatic stress disorder) 09/01/2020   Bipolar depression ([name]) 08/31/2020   Bipolar 2 disorder, major depressive episode ([name]) 12/12/2019   Anxiety 12/12/2019   Hemoglobin A1c less than 7.0% 05/26/2019   Depression 05/26/2019   Chronic back pain 05/26/2019   Left knee pain 05/26/2019   Gastroesophageal reflux disease without esophagitis 03/25/2019   Menometrorrhagia 05/18/2017   Morbid obesity ([name]) 06/23/2014   High grade squamous intraepithelial cervical dysplasia 08/01/2011    REFERRING DIAG: Lumbar pan with radiation down legs  THERAPY DIAG:  Chronic bilateral low back pain with left-sided sciatica  Cramp and spasm  Muscle weakness (generalized)  SUBJECTIVE:                                                                                                                                                                                            SUBJECTIVE STATEMENT: Pt reports her low back is not feeling better. Only the  medications are helping to relieve her pain, but they cause her to be drowsy.    PAIN:  Are you having pain? Yes NPRS scale: 7/10, pain range is 3-8/10 Pain location: Mid to low back Pain orientation: Bilateral  PAIN TYPE: aching and sharp Pain description: constant  Aggravating factors: Standing and walking, greater than 15 mins. Relieving factors: Medications, sitting with pillows for back support  PERTINENT HISTORY:  Obesity, anxiety, depression   PRECAUTIONS: None   PATIENT  GOALS To find things I can do to ease the pain and not rely on medications     OBJECTIVE:    DIAGNOSTIC FINDINGS:  Pt reports xrays at [facility] were negative   SCREENING FOR RED FLAGS: Bowel or bladder incontinence: No Cauda equina syndrome: No   COGNITION:          Overall cognitive status: Within functional limits for tasks assessed                        SENSATION:          Light touch: Appears intact   MUSCLE LENGTH: Hamstrings: Right tight ; Left tight Thomas test: Right tight ; Left tight   POSTURE:  Forward head, increased lumbar lordosis, valgus deformity of LEs   PALPATION: TTP of the lumbar paraspinals   LUMBARAROM/PROM   A/PROM A/PROM  05/06/2021  Flexion Mod limited, pulling LBP, lordosis did not reverse  Extension Min limited, pressure LBP  Right lateral flexion Min, pulling LBP L  Left lateral flexion Min, pulling LBP R  Right rotation Full, no increase in LBP  Left rotation Min, pressure LBP L      LE AROM/PROM:                     Grossly WNLs   LE MMT:   MMT Right 05/06/2021 Left 05/06/2021  Hip flexion 4+ 4+  Hip extension 4+ 4+  Hip abduction 4+ 4+  Hip adduction 5 5  Hip internal rotation 4+ 4+  Hip external rotation 4+ 4+  Knee flexion 5 5  Knee extension 5 5  Ankle dorsiflexion      Ankle plantarflexion      Ankle inversion      Ankle eversion      - decreased strength of core  (Blank rows = not tested)   LUMBAR SPECIAL TESTS:  Straight  leg raise test: Negative and Slump test: Negative   FUNCTIONAL TESTS:  5 times sit to stand: TBA   GAIT: Distance walked: 200' within clinic Assistive device utilized: None Level of assistance: Complete Independence Comments: gait pattern WNLs       TODAY'S TREATMENT  [facility] Adult PT Treatment:                                                DATE: 05/13/21 Therapeutic Exercise: Nustep 5 min L4 UE/LE Seated flexion c theraball, forward and lateral 2x each 20" SKTC x2 20" each PPT 2x10 Mod Thomas stretch x2 20" each LTR x5, 5" L and R Manual Therapy: LAD both LE, unilateral Pas L1-L4  Eval Treatment Seated Flexion Stretch with Swiss Ball 10 reps - 15 hold Supine Posterior Pelvic Tilt 10 reps - 3 hold Hooklying Single Knee to Chest 3 reps - 15 hold Modified Thomas Stretch - 2 reps - 15 hold     PATIENT EDUCATION:  Education details: Eval findings, POC, HEP, sleeping position and support for comfort Person educated: Patient Education method: Explanation, Demonstration, Tactile cues, Verbal cues, and Handouts Education comprehension: verbalized understanding, returned demonstration, verbal cues required, and tactile cues required     HOME EXERCISE PROGRAM: Access Code: [email or web address] URL: [email or web address] Date: 05/06/2021 Prepared by: Joellyn Rued   Exercises Seated Flexion Stretch with Swiss Ball - 3 x daily - 7 x weekly -  3 sets - 10 reps - 15 hold Supine Posterior Pelvic Tilt - 2 x daily - 7 x weekly - 2 sets - 10 reps - 3 hold Hooklying Single Knee to Chest - 2 x daily - 7 x weekly - 1 sets - 3 reps - 15 hold Modified Thomas Stretch - 2 x daily - 7 x weekly - 1 sets - 3 reps - 15 hold   CLINICAL IMPRESSION: PT was completed to address low back mobility and flexibility per manual techniques and therex. Pts lumbar spine is significantly lordotic and does not reverse from of this position with forward flexion. Pt tolerated the session fair reporting discomfort  with therex addressing this posture and associated muscular tightness. Discussed the use of TPDN with pt and its benefits to address her issue. Pt will continue to benefit from skilled PT to address deficits to optimize function with less pain.  IMPAIRMENTS:  Difficulty walking, decreased ROM, decreased strength, increased muscle spasms, impaired flexibility, postural   dysfunction, obesity, and pain.     GOALS:   SHORT TERM GOALS:   STG Name Target Date Goal status  1 Pt will be Ind in an initial HEP Baseline: Started on eval 05/30/21 INITIAL  2 Pt will voice understanding of measures to assist in the decrease and management of LBP Baseline: Started on eval 05/30/21 INITIAL  3 Pt will demonstrate proper body mechanics with ADLs Baseline: 05/30/21 INITIAL    LONG TERM GOALS:    LTG Name Target Date Goal status  1 Increase bilat hip strength to 5/5 and improve core strength for improved trunk stability  Baseline:4+/5 07/09/21 INITIAL  2 Increase low back ROM to min limitation c improve forward flexion and for hamstring and hip flexor flexibility Baseline:min to mod limitations 07/09/21 INITIAL  3 Pt will report a decrease in her LBP range to 5/10 or less and/or decrease in frequency Baseline:3-8/10 07/09/21 INITIAL  4 Pt will tolerate 30 mins or walking with a LBP of 5/10 or less Baseline:15 mins 8/10 07/09/21 INITIAL  5 Pt will be ind in a final HEP to maintain achieved LOF Baseline: 07/09/21 INITIAL    PLAN: PT FREQUENCY: 2x/week   PT DURATION: 6 weeks   PLANNED INTERVENTIONS: Therapeutic exercises, Therapeutic activity, Neuro Muscular re-education, Balance training, Gait training, Patient/Family education, Joint mobilization, Aquatic Therapy, Dry Needling, Electrical stimulation, Spinal mobilization, Cryotherapy, Moist heat, Taping, Traction, Ultrasound, Ionotophoresis 4mg /ml Dexamethasone, and Manual therapy   PLAN FOR NEXT SESSION: Assess pt's response to HEP and education. Progress  there ex as indicated. Use of manual therapy and/or modalities as indicated. Possible use of TPDN the next PT session   Joellyn Rued MS, PT 05/14/21 6:11 AM

## 2021-05-13 ENCOUNTER — Other Ambulatory Visit: Payer: Self-pay

## 2021-05-13 ENCOUNTER — Ambulatory Visit: Payer: Medicaid Other

## 2021-05-13 DIAGNOSIS — G8929 Other chronic pain: Secondary | ICD-10-CM

## 2021-05-13 DIAGNOSIS — R252 Cramp and spasm: Secondary | ICD-10-CM

## 2021-05-13 DIAGNOSIS — M5442 Lumbago with sciatica, left side: Secondary | ICD-10-CM | POA: Diagnosis not present

## 2021-05-13 DIAGNOSIS — M6281 Muscle weakness (generalized): Secondary | ICD-10-CM

## 2021-05-18 ENCOUNTER — Ambulatory Visit: Payer: Medicaid Other

## 2021-05-18 ENCOUNTER — Other Ambulatory Visit: Payer: Self-pay

## 2021-05-18 DIAGNOSIS — R252 Cramp and spasm: Secondary | ICD-10-CM

## 2021-05-18 DIAGNOSIS — M5442 Lumbago with sciatica, left side: Secondary | ICD-10-CM | POA: Diagnosis not present

## 2021-05-18 DIAGNOSIS — M6281 Muscle weakness (generalized): Secondary | ICD-10-CM

## 2021-05-18 DIAGNOSIS — G8929 Other chronic pain: Secondary | ICD-10-CM

## 2021-05-18 NOTE — Therapy (Addendum)
OUTPATIENT PHYSICAL THERAPY TREATMENT NOTE/Re-Authorization/Discharge   Patient Name: Jade Jade [name] MRN: [id number] DOB:July 12, 1975, 46 y.o., female Today's Date: 05/19/2021  PCP: Jade Francois, NP REFERRING PROVIDER: Vevelyn Francois, NP   PT End of Session - 05/19/21 2135     Visit Number 3    Number of Visits 13    Date for PT Re-Evaluation 06/25/21    Authorization Type MEDICAID OF [place]    PT Start Time 4098    PT Stop Time 0939    PT Time Calculation (min) 44 min    Activity Tolerance Patient tolerated treatment well    Behavior During Therapy [facility] for tasks assessed/performed              Past Medical History:  Diagnosis Date   Abnormal Pap smear of cervix    Anxiety    Depression    Vitamin D deficiency 04/2019   Past Surgical History:  Procedure Laterality Date   CERVICAL BIOPSY  W/ LOOP ELECTRODE EXCISION  03-2013   CESAREAN SECTION     x 1   COLPOSCOPY     TUBAL LIGATION     Patient Active Problem List   Diagnosis Date Noted   PTSD (post-traumatic stress disorder) 09/01/2020   Bipolar depression ([place]) 08/31/2020   Bipolar 2 disorder, major depressive episode ([place]) 12/12/2019   Anxiety 12/12/2019   Hemoglobin A1c less than 7.0% 05/26/2019   Depression 05/26/2019   Chronic back pain 05/26/2019   Left knee pain 05/26/2019   Gastroesophageal reflux disease without esophagitis 03/25/2019   Menometrorrhagia 05/18/2017   Morbid obesity ([place]) 06/23/2014   High grade squamous intraepithelial cervical dysplasia 08/01/2011    REFERRING DIAG: Lumbar pan with radiation down legs  THERAPY DIAG:  Chronic bilateral low back pain with left-sided sciatica  Cramp and spasm  Muscle weakness (generalized)  SUBJECTIVE:                                                                                                                                                                                            SUBJECTIVE STATEMENT: Pt reports a flare up of  significant low back pain. Pt is not able to recall an incident casing this increase.  PAIN:  Are you having pain? Yes NPRS scale: 9/10, pain range is 3-9/10 Pain location: Mid to low back Pain orientation: Bilateral  PAIN TYPE: aching and sharp Pain description: constant  Aggravating factors: Standing and walking, greater than 15 mins. Relieving factors: Medications, sitting with pillows for back support  PERTINENT HISTORY:  Obesity, anxiety, depression   PRECAUTIONS: None   PATIENT GOALS To find things I  can do to ease the pain and not rely on medications     OBJECTIVE:    DIAGNOSTIC FINDINGS:  Pt reports xrays at [facility] were negative   SCREENING FOR RED FLAGS: Bowel or bladder incontinence: Jade [name] Cauda equina syndrome: Jade [name]   COGNITION:          Overall cognitive status: Within functional limits for tasks assessed                        SENSATION:          Light touch: Appears intact   MUSCLE LENGTH: Hamstrings: Right tight ; Left tight Thomas test: Right tight ; Left tight   POSTURE:  Forward head, increased lumbar lordosis, valgus deformity of LEs   PALPATION: TTP of the lumbar paraspinals   LUMBARAROM/PROM   A/PROM A/PROM  05/06/2021  Flexion Mod limited, pulling LBP, lordosis did not reverse  Extension Min limited, pressure LBP  Right lateral flexion Min, pulling LBP L  Left lateral flexion Min, pulling LBP R  Right rotation Full, Jade [name] increase in LBP  Left rotation Min, pressure LBP L      LE AROM/PROM:                     Grossly WNLs   LE MMT:   MMT Right 05/06/2021 Left 05/06/2021  Hip flexion 4+ 4+  Hip extension 4+ 4+  Hip abduction 4+ 4+  Hip adduction 5 5  Hip internal rotation 4+ 4+  Hip external rotation 4+ 4+  Knee flexion 5 5  Knee extension 5 5  Ankle dorsiflexion      Ankle plantarflexion      Ankle inversion      Ankle eversion      - decreased strength of core  (Blank rows = not tested)   LUMBAR SPECIAL TESTS:   Straight leg raise test: Negative and Slump test: Negative   FUNCTIONAL TESTS:  5 times sit to stand: TBA   GAIT: Distance walked: 200' within clinic Assistive device utilized: None Level of assistance: Complete Independence Comments: gait pattern WNLs       TODAY'S TREATMENT  [facility] Adult PT Treatment:                                                DATE: 05/18/21 Therapeutic Exercise: Nustep 5 min L4 UE/LE Seated flexion c theraball, forward and lateral 2x each 20"   SKTC x2 20" each Bridging x10 Hip clamshells 2x10 c GTB   Positioning supine with bolster under knees, pt completed PPT 2x10. Pt reported a decrease in pain to 7/10. Manual Therapy: Bilat LE traction c LEs propped on a physioball for lumbar traction Modalities: IFC/premod estim with parallel pattern to the lumbar paraspinals with intensity to [place] 1- 14; CH 2 -13.5 for 15 mins. Moist heat was applied to the low back in conjunction with the estim Self Care: Pt is to lie supine with LEs elevated and complete PPTs to assist in pain reduction  [facility] Adult PT Treatment:                                                DATE:  05/13/21 Therapeutic Exercise: Nustep 5 min L4 UE/LE Seated flexion c theraball, forward and lateral 2x each 20" SKTC x2 20" each PPT 2x10 Mod Thomas stretch x2 20" each LTR x5, 5" L and R Manual Therapy: LAD both LE, unilateral PAs L1-L4  Eval Treatment Seated Flexion Stretch with Swiss Ball 10 reps - 15 hold Supine Posterior Pelvic Tilt 10 reps - 3 hold Hooklying Single Knee to Chest 3 reps - 15 hold Modified Thomas Stretch - 2 reps - 15 hold     PATIENT EDUCATION:  Education details: Eval findings, POC, HEP, sleeping position and support for comfort Person educated: Patient Education method: Explanation, Demonstration, Tactile cues, Verbal cues, and Handouts Education comprehension: verbalized understanding, returned demonstration, verbal cues required, and tactile cues required     HOME  EXERCISE PROGRAM: Access Code: [email or web address] URL: [email or web address] Date: 05/06/2021 Prepared by: Jade Jade [name]   Exercises Seated Flexion Stretch with Swiss Ball - 3 x daily - 7 x weekly - 3 sets - 10 reps - 15 hold Supine Posterior Pelvic Tilt - 2 x daily - 7 x weekly - 2 sets - 10 reps - 3 hold Hooklying Single Knee to Chest - 2 x daily - 7 x weekly - 1 sets - 3 reps - 15 hold Modified Thomas Stretch - 2 x daily - 7 x weekly - 1 sets - 3 reps - 15 hold   CLINICAL IMPRESSION: Pt presents to PT today with an increase in in low back pain. Pt is not sure what caused this increase. PT was completed to decrease pain per modalities of estim and moist heat, and manual techniques for lumbar traction. Additionally, therex was completed for lumbopelvic flexibility and core strengthening. After the PT session, pt reported a decrease in the low back pain to 7/10. Pt has a chronic history of low back pain and progress at a slower pace is anticipated. Recommend the continuation of the skilled PT POC of 2x/wk for 6 wks to address pain, and lumbopelvic ROM and strength deficits to optimize function with less pain.  IMPAIRMENTS:  Difficulty walking, decreased ROM, decreased strength, increased muscle spasms, impaired flexibility, postural   dysfunction, obesity, and pain.     GOALS:   SHORT TERM GOALS:   STG Name Target Date Goal status  1 Pt will be Ind in an initial HEP Baseline: Started on eval 05/30/21 MET  2 Pt will voice understanding of measures to assist in the decrease and management of LBP. 05/18/21: Positioning, therex, and moist heat for symptom management  Baseline: Started on eval 05/30/21 Partial met  3 Pt will demonstrate proper body mechanics with ADLs. 05/18/21: Education limited by flare up of low back pain Baseline: 05/30/21 Ongoing-    LONG TERM GOALS:    LTG Name Target Date Goal status  1 Increase bilat hip strength to 5/5 and improve core strength for improved trunk  stability . 05/18/21: Re-assessment limited due to flare up of low back pain Baseline:4+/5 07/09/21 Ongoing   2 Increase low back ROM to min limitation c improve forward flexion and for hamstring and hip flexor flexibility. 05/18/21: Re-assessment limited due to flare up of low back pain Baseline:min to mod limitations 07/09/21 Ongoing  3 Pt will report a decrease in her LBP range to 5/10 or less and/or decrease in frequency Baseline:3-8/10 07/09/21 Ongoing  4 Pt will tolerate 30 mins or walking with a LBP of 5/10 or less. 05/18/21: Re-assessment limited due to flare up of pain Baseline:15 mins 8/10  07/09/21 Ongoing  5 Pt will be ind in a final HEP to maintain achieved LOF. 05/18/21: Initial HEP developed Baseline: 07/09/21 Ongoing    PLAN: PT FREQUENCY: 2x/week   PT DURATION: 6 weeks   PLANNED INTERVENTIONS: Therapeutic exercises, Therapeutic activity, Neuro Muscular re-education, Balance training, Gait training, Patient/Family education, Joint mobilization, Aquatic Therapy, Dry Needling, Electrical stimulation, Spinal mobilization, Cryotherapy, Moist heat, Taping, Traction, Ultrasound, Ionotophoresis 69m/ml Dexamethasone, and Manual therapy   PLAN FOR NEXT SESSION: Progress there ex as indicated. Instruction in proper body mechanics. Use of manual therapy and/or modalities as indicated.   Jade Hoff MS, PT 05/19/21 10:29 PM  Check all possible CPT codes: 923300 Therapeutic Exercise, [phone] Neuro Re-education, [phone]- Gait Training, [phone]- Manual Therapy, 97530 - Therapeutic Activities, 97535 - Self Care, [phone]- Mechanical traction, 97014 - Electrical stimulation (unattended), [id number]- Electrical stimulation (Manual), [id number]- Iontophoresis, [id number]- Ultrasound, and [id number]- Aquatic therapy     PHYSICAL THERAPY DISCHARGE SUMMARY  Visits from Start of Care: 3  Spoke to pt by phone re: Jade [name] show appts. Pt reports she is working her her MD regarding medications to help better decrease her back pain.  At this time, she would like to DC PT and possibly resume in the future.    Current functional level related to goals / functional outcomes: See above   Remaining deficits: See above   Education / Equipment: HEP   Patient agrees to discharge. Patient goals were not met. Patient is being discharged due to not returning since the last visit.  Jade No MS, PT 06/03/21 8:44 AM

## 2021-05-20 ENCOUNTER — Ambulatory Visit: Payer: Medicaid Other

## 2021-05-27 ENCOUNTER — Ambulatory Visit: Payer: Medicaid Other

## 2021-06-01 ENCOUNTER — Ambulatory Visit: Payer: Medicaid Other | Attending: Family Medicine

## 2021-06-01 NOTE — Therapy (Incomplete)
OUTPATIENT PHYSICAL THERAPY TREATMENT NOTE/Re-Authorization   Patient Name: ILIANNA [name] MRN: [id number] DOB:[birth year], 46 y.o., female Today's Date: 06/01/2021  PCP: Vevelyn Francois, NP REFERRING PROVIDER: Vevelyn Francois, NP      Past Medical History:  Diagnosis Date   Abnormal Pap smear of cervix    Anxiety    Depression    Vitamin D deficiency 04/2019   Past Surgical History:  Procedure Laterality Date   CERVICAL BIOPSY  W/ LOOP ELECTRODE EXCISION  03-2013   CESAREAN SECTION     x 1   COLPOSCOPY     TUBAL LIGATION     Patient Active Problem List   Diagnosis Date Noted   PTSD (post-traumatic stress disorder) 09/01/2020   Bipolar depression ([place]) 08/31/2020   Bipolar 2 disorder, major depressive episode ([place]) 12/12/2019   Anxiety 12/12/2019   Hemoglobin A1c less than 7.0% 05/26/2019   Depression 05/26/2019   Chronic back pain 05/26/2019   Left knee pain 05/26/2019   Gastroesophageal reflux disease without esophagitis 03/25/2019   Menometrorrhagia 05/18/2017   Morbid obesity ([place]) 06/23/2014   High grade squamous intraepithelial cervical dysplasia 08/01/2011    REFERRING DIAG: Lumbar pan with radiation down legs  THERAPY DIAG:  No diagnosis found.  SUBJECTIVE:                                                                                                                                                                                            SUBJECTIVE STATEMENT: Pt reports a flare up of significant low back pain. Pt is not able to recall an incident casing this increase.  PAIN:  Are you having pain? Yes NPRS scale: 9/10, pain range is 3-9/10 Pain location: Mid to low back Pain orientation: Bilateral  PAIN TYPE: aching and sharp Pain description: constant  Aggravating factors: Standing and walking, greater than 15 mins. Relieving factors: Medications, sitting with pillows for back support  PERTINENT HISTORY:  Obesity, anxiety, depression    PRECAUTIONS: None   PATIENT GOALS To find things I can do to ease the pain and not rely on medications     OBJECTIVE:    DIAGNOSTIC FINDINGS:  Pt reports xrays at [facility] were negative   SCREENING FOR RED FLAGS: Bowel or bladder incontinence: No Cauda equina syndrome: No   COGNITION:          Overall cognitive status: Within functional limits for tasks assessed                        SENSATION:          Light touch:  Appears intact   MUSCLE LENGTH: Hamstrings: Right tight ; Left tight Thomas test: Right tight ; Left tight   POSTURE:  Forward head, increased lumbar lordosis, valgus deformity of LEs   PALPATION: TTP of the lumbar paraspinals   LUMBARAROM/PROM   A/PROM A/PROM  05/06/2021  Flexion Mod limited, pulling LBP, lordosis did not reverse  Extension Min limited, pressure LBP  Right lateral flexion Min, pulling LBP L  Left lateral flexion Min, pulling LBP R  Right rotation Full, no increase in LBP  Left rotation Min, pressure LBP L      LE AROM/PROM:                     Grossly WNLs   LE MMT:   MMT Right 05/06/2021 Left 05/06/2021  Hip flexion 4+ 4+  Hip extension 4+ 4+  Hip abduction 4+ 4+  Hip adduction 5 5  Hip internal rotation 4+ 4+  Hip external rotation 4+ 4+  Knee flexion 5 5  Knee extension 5 5  Ankle dorsiflexion      Ankle plantarflexion      Ankle inversion      Ankle eversion      - decreased strength of core  (Blank rows = not tested)   LUMBAR SPECIAL TESTS:  Straight leg raise test: Negative and Slump test: Negative   FUNCTIONAL TESTS:  5 times sit to stand: TBA   GAIT: Distance walked: 200' within clinic Assistive device utilized: None Level of assistance: Complete Independence Comments: gait pattern WNLs       TODAY'S TREATMENT  [facility] Adult PT Treatment:                                                DATE: 06/01/21 Therapeutic Exercise: *** Manual Therapy: *** Neuromuscular re-ed: *** Therapeutic  Activity: *** Modalities: *** Self Care: ***   [facility] Adult PT Treatment:                                                DATE: 05/18/21 Therapeutic Exercise: Nustep 5 min L4 UE/LE Seated flexion c theraball, forward and lateral 2x each 20"   SKTC x2 20" each Bridging x10 Hip clamshells 2x10 c GTB   Positioning supine with bolster under knees, pt completed PPT 2x10. Pt reported a decrease in pain to 7/10. Manual Therapy: Bilat LE traction c LEs propped on a physioball for lumbar traction Modalities: IFC/premod estim with parallel pattern to the lumbar paraspinals with intensity to [place] 1- 14; CH 2 -13.5 for 15 mins. Moist heat was applied to the low back in conjunction with the estim Self Care: Pt is to lie supine with LEs elevated and complete PPTs to assist in pain reduction  [facility] Adult PT Treatment:                                                DATE: 05/13/21 Therapeutic Exercise: Nustep 5 min L4 UE/LE Seated flexion c theraball, forward and lateral 2x each 20" SKTC x2 20" each PPT 2x10 Mod Thomas stretch x2 20" each  LTR x5, 5" L and R Manual Therapy: LAD both LE, unilateral PAs L1-L4  Eval Treatment Seated Flexion Stretch with Swiss Ball 10 reps - 15 hold Supine Posterior Pelvic Tilt 10 reps - 3 hold Hooklying Single Knee to Chest 3 reps - 15 hold Modified Thomas Stretch - 2 reps - 15 hold     PATIENT EDUCATION:  Education details: Eval findings, POC, HEP, sleeping position and support for comfort Person educated: Patient Education method: Explanation, Demonstration, Tactile cues, Verbal cues, and Handouts Education comprehension: verbalized understanding, returned demonstration, verbal cues required, and tactile cues required     HOME EXERCISE PROGRAM: Access Code: [email or web address] URL: [email or web address] Date: 05/06/2021 Prepared by: Gar Ponto   Exercises Seated Flexion Stretch with Swiss Ball - 3 x daily - 7 x weekly - 3 sets - 10 reps - 15  hold Supine Posterior Pelvic Tilt - 2 x daily - 7 x weekly - 2 sets - 10 reps - 3 hold Hooklying Single Knee to Chest - 2 x daily - 7 x weekly - 1 sets - 3 reps - 15 hold Modified Thomas Stretch - 2 x daily - 7 x weekly - 1 sets - 3 reps - 15 hold   CLINICAL IMPRESSION: Pt presents to PT today with an increase in in low back pain. Pt is not sure what caused this increase. PT was completed to decrease pain per modalities of estim and moist heat, and manual techniques for lumbar traction. Additionally, therex was completed for lumbopelvic flexibility and core strengthening. After the PT session, pt reported a decrease in the low back pain to 7/10. Pt has a chronic history of low back pain and progress at a slower pace is anticipated. Recommend the continuation of the skilled PT POC of 2x/wk for 6 wks to address pain, and lumbopelvic ROM and strength deficits to optimize function with less pain.  IMPAIRMENTS:  Difficulty walking, decreased ROM, decreased strength, increased muscle spasms, impaired flexibility, postural   dysfunction, obesity, and pain.     GOALS:   SHORT TERM GOALS:   STG Name Target Date Goal status  1 Pt will be Ind in an initial HEP Baseline: Started on eval 05/30/21 MET  2 Pt will voice understanding of measures to assist in the decrease and management of LBP. 05/18/21: Positioning, therex, and moist heat for symptom management  Baseline: Started on eval 05/30/21 Partial met  3 Pt will demonstrate proper body mechanics with ADLs. 05/18/21: Education limited by flare up of low back pain Baseline: 05/30/21 Ongoing-    LONG TERM GOALS:    LTG Name Target Date Goal status  1 Increase bilat hip strength to 5/5 and improve core strength for improved trunk stability . 05/18/21: Re-assessment limited due to flare up of low back pain Baseline:4+/5 07/09/21 Ongoing   2 Increase low back ROM to min limitation c improve forward flexion and for hamstring and hip flexor flexibility. 05/18/21:  Re-assessment limited due to flare up of low back pain Baseline:min to mod limitations 07/09/21 Ongoing  3 Pt will report a decrease in her LBP range to 5/10 or less and/or decrease in frequency Baseline:3-8/10 07/09/21 Ongoing  4 Pt will tolerate 30 mins or walking with a LBP of 5/10 or less. 05/18/21: Re-assessment limited due to flare up of pain Baseline:15 mins 8/10 07/09/21 Ongoing  5 Pt will be ind in a final HEP to maintain achieved LOF. 05/18/21: Initial HEP developed Baseline: 07/09/21 Ongoing    PLAN: PT FREQUENCY: 2x/week  PT DURATION: 6 weeks   PLANNED INTERVENTIONS: Therapeutic exercises, Therapeutic activity, Neuro Muscular re-education, Balance training, Gait training, Patient/Family education, Joint mobilization, Aquatic Therapy, Dry Needling, Electrical stimulation, Spinal mobilization, Cryotherapy, Moist heat, Taping, Traction, Ultrasound, Ionotophoresis 44m/ml Dexamethasone, and Manual therapy   PLAN FOR NEXT SESSION: Progress there ex as indicated. Instruction in proper body mechanics. Use of manual therapy and/or modalities as indicated.   Nyxon Strupp MS, PT 06/01/21 5:43 AM  Check all possible CPT codes: 97110- Therapeutic Exercise, [phone] Neuro Re-education, [id number]- Gait Training, 97140 - Manual Therapy, 97530 - Therapeutic Activities, 97535 - Self Care, [phone]- Mechanical traction, 97014 - Electrical stimulation (unattended), [id number]- Electrical stimulation (Manual), [id number]- Iontophoresis, [id number]- Ultrasound, and [id number]- Aquatic therapy

## 2021-06-03 ENCOUNTER — Telehealth: Payer: Self-pay

## 2021-06-03 ENCOUNTER — Ambulatory Visit: Payer: Medicaid Other

## 2021-06-03 NOTE — Telephone Encounter (Signed)
Spoke to pt by phone re: no show appts. Pt reports she is working her her MD regarding medications to help better decrease her back pain. At this time, she would like to DC PT and possibly resume in the future.   ?

## 2021-06-10 ENCOUNTER — Ambulatory Visit: Payer: Medicaid Other

## 2021-06-14 ENCOUNTER — Telehealth (INDEPENDENT_AMBULATORY_CARE_PROVIDER_SITE_OTHER): Payer: Medicaid Other | Admitting: Psychiatry

## 2021-06-14 ENCOUNTER — Other Ambulatory Visit: Payer: Self-pay

## 2021-06-14 DIAGNOSIS — F3181 Bipolar II disorder: Secondary | ICD-10-CM | POA: Diagnosis not present

## 2021-06-14 DIAGNOSIS — F419 Anxiety disorder, unspecified: Secondary | ICD-10-CM | POA: Diagnosis not present

## 2021-06-14 MED ORDER — BUPROPION HCL ER (XL) 150 MG PO TB24: 150.0000 mg | ORAL_TABLET | ORAL | 2 refills | Status: DC

## 2021-06-14 MED ORDER — HYDROXYZINE HCL 10 MG PO TABS: 10.0000 mg | ORAL_TABLET | Freq: Three times a day (TID) | ORAL | 2 refills | Status: DC | PRN

## 2021-06-14 MED ORDER — LAMOTRIGINE 100 MG PO TABS: 100.0000 mg | ORAL_TABLET | Freq: Every day | ORAL | 2 refills | Status: DC

## 2021-06-14 NOTE — Progress Notes (Signed)
BH MD/PA/NP OP Progress Note ? ?06/14/2021 1:12 PM ?Jade [name]  ?MRN:  [id number] ? ?Virtual Visit via Video Note ? ?I connected with Jade [name] on 06/14/21 at  1:00 PM EDT by a video enabled telemedicine application and verified that I am speaking with the correct person using two identifiers. ? ?Location: ?Patient: home ?Provider: off site ?  ?I discussed the limitations of evaluation and management by telemedicine and the availability of in person appointments. The patient expressed understanding and agreed to proceed. ? ?  ?I discussed the assessment and treatment plan with the patient. The patient was provided an opportunity to ask questions and all were answered. The patient agreed with the plan and demonstrated an understanding of the instructions. ?  ?The patient was advised to call back or seek an in-person evaluation if the symptoms worsen or if the condition fails to improve as anticipated. ? ?I provided 5 minutes of non-face-to-face time during this encounter. ? ? ?Mcneil Sober, NP  ? ?Chief Complaint: Medication management ? ?HPI: Jade [name] is a 46 year old female presenting to [facility] behavioral health outpatient for psychiatric follow-up evaluation.  She has a psychiatric history of PTSD, bipolar disorder, and anxiety.  Patient symptoms are managed with Wellbutrin XL 150 mg daily, hydroxyzine 10 mg 3 times daily as needed for anxiety, and Lamictal 100 mg daily.  She reports medication compliance and denies adverse effects.  Patient reports that medications are effective and denies a need for dosage adjustment today. ? ?Patient is alert and oriented x4, calm, pleasant and willing to engage.  She is dressed appropriately for the weather and well-groomed.  Patient reports good sleep and appetite.  Patient reports an okay mood today but states that she has been going through things lately.  Patient reports being recently diagnosed with chronic back pain and sciatica.  She she reports pain  management with hydrocodone and Voltaren.  Patient denies suicidal or homicidal ideations, paranoia, delusions, auditory or visual hallucinations. ? ?Visit Diagnosis:  ?  ICD-10-CM   ?1. Anxiety  F41.9   ?  ?2. Bipolar 2 disorder, major depressive episode ([name])  F31.81   ?  ? ? ?Past Psychiatric History: Bipolar disorder, anxiety and PTSD. ? ?Past Medical History:  ?Past Medical History:  ?Diagnosis Date  ? Abnormal Pap smear of cervix   ? Anxiety   ? Depression   ? Vitamin D deficiency 04/2019  ?  ?Past Surgical History:  ?Procedure Laterality Date  ? CERVICAL BIOPSY  W/ LOOP ELECTRODE EXCISION  03-2013  ? CESAREAN SECTION    ? x 1  ? COLPOSCOPY    ? TUBAL LIGATION    ? ? ?Family Psychiatric History: None known ? ?Family History:  ?Family History  ?Problem Relation Age of Onset  ? Hypertension Mother   ? Diabetes Mother   ? Hyperlipidemia Brother   ? Pancreatic cancer Maternal Grandmother   ? Diabetes Maternal Grandmother   ? ? ?Social History:  ?Social History  ? ?Socioeconomic History  ? Marital status: Single  ?  Spouse name: Not on file  ? Number of children: 2  ? Years of education: 68  ? Highest education level: Some college, no degree  ?Occupational History  ? Not on file  ?Tobacco Use  ? Smoking status: Never  ? Smokeless tobacco: Never  ?Vaping Use  ? Vaping Use: Never used  ?Substance and Sexual Activity  ? Alcohol use: No  ?  Comment: socially   ? Drug  use: No  ? Sexual activity: Not Currently  ?  Birth control/protection: Surgical  ?Other Topics Concern  ? Not on file  ?Social History Narrative  ? Marital status: single; dating seriously x 7 years.  ?    Children:  2 children (14, 6)  ?    Lives: with two sons  ?    Employment:  Conservation officer, nature at [employer]; first shift for 8 years.  ?    Tobacco: none  ?    Alcohol:  4-6 drinks per month  ?    Drugs: none  ?    Exercise:  Sporadic; twice weekly; walking  ?    Seatbelt: 100%  ?    Guns: none  ?    Sexual activity:  Total partners = 6; history of Chlamydia in 2002;  last STD screening 2016; males only  ? ?Social Determinants of Health  ? ?Financial Resource Strain: Not on file  ?Food Insecurity: Not on file  ?Transportation Needs: Not on file  ?Physical Activity: Not on file  ?Stress: Not on file  ?Social Connections: Not on file  ? ? ?Allergies: No Known Allergies ? ?Metabolic Disorder Labs: ?Lab Results  ?Component Value Date  ? HGBA1C 6.3 (H) 08/31/2020  ? MPG 134 08/31/2020  ? MPG 120 (H) 11/17/2014  ? ?No results found for: PROLACTIN ?Lab Results  ?Component Value Date  ? CHOL 195 08/31/2020  ? TRIG 92 08/31/2020  ? HDL 49 08/31/2020  ? CHOLHDL 4.0 08/31/2020  ? VLDL 18 08/31/2020  ? LDLCALC 128 (H) 08/31/2020  ? LDLCALC 113 (H) 05/24/2019  ? ?Lab Results  ?Component Value Date  ? TSH 1.315 08/31/2020  ? TSH 0.572 05/24/2019  ? ? ?Therapeutic Level Labs: ?No results found for: LITHIUM ?No results found for: VALPROATE ?No components found for:  CBMZ ? ?Current Medications: ?Current Outpatient Medications  ?Medication Sig Dispense Refill  ? buPROPion (WELLBUTRIN XL) 150 MG 24 hr tablet Take 1 tablet (150 mg total) by mouth every morning. 30 tablet 2  ? celecoxib (CELEBREX) 200 MG capsule Take 200 mg by mouth daily after breakfast.    ? hydrOXYzine (ATARAX) 10 MG tablet Take 1 tablet (10 mg total) by mouth 3 (three) times daily as needed for anxiety. 90 tablet 3  ? lamoTRIgine (LAMICTAL) 100 MG tablet Take 1 tablet (100 mg total) by mouth daily. For mood stabilization 30 tablet 3  ? methocarbamol (ROBAXIN) 750 MG tablet Take 750 mg by mouth 2 (two) times daily.    ? predniSONE (DELTASONE) 5 MG tablet Take 5 mg by mouth daily with breakfast.    ? traMADol (ULTRAM) 50 MG tablet Take 50 mg by mouth 3 (three) times daily as needed.    ? Vitamin D, Ergocalciferol, (DRISDOL) 1.25 MG (50000 UNIT) CAPS capsule Take 50,000 Units by mouth every 7 (seven) days.    ? ?No current facility-administered medications for this visit.  ? ? ? ?Musculoskeletal: ?Strength & Muscle Tone:  virtual  visit ?Gait & Station:  virtual visit ?Patient leans: N/A ? ?Psychiatric Specialty Exam: ?Review of Systems  ?Psychiatric/Behavioral:  Negative for hallucinations, self-injury and suicidal ideas.   ?All other systems reviewed and are negative.  ?There were no vitals taken for this visit.There is no height or weight on file to calculate BMI.  ?General Appearance: Well Groomed  ?Eye Contact:  Good  ?Speech:  Clear and Coherent  ?Volume:  Normal  ?Mood:  Euthymic  ?Affect:  Congruent  ?Thought Process:  Goal Directed  ?  Orientation:  Full (Time, Place, and Person)  ?Thought Content: Logical   ?Suicidal Thoughts:  No  ?Homicidal Thoughts:  No  ?Memory:  Immediate;   Good ?Recent;   Good ?Remote;   Good  ?Judgement:  Good  ?Insight:  Good  ?Psychomotor Activity:  Normal  ?Concentration:  Concentration: Good and Attention Span: Good  ?Recall:  Good  ?Fund of Knowledge: Good  ?Language: Good  ?Akathisia:  NA  ?Handed:  Right  ?AIMS (if indicated): not done  ?Assets:  Communication Skills  ?ADL's:  Intact  ?Cognition: WNL  ?Sleep:  Good  ? ?Screenings: ?GAD-7   ? ?Flowsheet Row [facility]  ?Total GAD-7 Score 4 4 6 16   ? ?  ? ?PHQ2-9   ? ?Flowsheet Row [facility]  ?PHQ-2 Total Score 0 1 2 6 3   ?PHQ-9 Total Score 4 5 8 20 15   ? ?  ? ?Flowsheet Row [facility]  [facility]  ?C-SSRS RISK CATEGORY Error: Question 6 not populated Error: Q3,

## 2021-06-28 ENCOUNTER — Encounter (HOSPITAL_COMMUNITY): Payer: Self-pay

## 2021-06-28 ENCOUNTER — Ambulatory Visit (INDEPENDENT_AMBULATORY_CARE_PROVIDER_SITE_OTHER): Payer: Medicaid Other | Admitting: Clinical

## 2021-06-28 DIAGNOSIS — F3181 Bipolar II disorder: Secondary | ICD-10-CM | POA: Diagnosis not present

## 2021-06-28 NOTE — Plan of Care (Signed)
Client was in agreement with plan. °

## 2021-06-28 NOTE — Progress Notes (Signed)
? ?THERAPIST PROGRESS NOTE ? ?Session Time: 45 minutes ? ?Participation Level: Active ? ?Behavioral Response: CasualAlertEuthymic ? ?Type of Therapy: Individual Therapy ? ?Treatment Goals addressed: Client will score less than a 10 on the patient health questionnaire ? ?ProgressTowards Goals: Progressing ? ?Interventions: CBT and Supportive ? ?Summary:  ?Jade [name] is a 46 y.o. female who presents for the scheduled session oriented x5, appropriately dressed, and friendly.  Client denied hallucinations and delusions. ?Client reported on today she has been doing fairly well.  Client reported that she was last seen she was let go from her previous job.  Client reported shortly after she continued to have worsening physical pain with her back.  Client reported she was diagnosed with sciatica.  Client reported majority of the time she rates her pain as an 8 or 9 on a scale of 10 for severity.  Client reported she is going through trial and error with her doctors between physical therapy, pain management, and ongoing testing on how to best treat her condition. Client reported oftentimes she ends up sleeping on the floor and has a difficulty sitting and/or standing for extended periods of time.  Client reported feeling depressed due to the chronic pain.  Client reported having some episodes of lack of motivation and depressed mood while laying in the bed for approximately 3 days.  Client reported she tries to keep herself upbeat because her son likes to sometimes go outside and/or engage her in activities together such as playing cards or showing him things that he is interested in.  Client reported some days are hard for her to do it because she does not have the energy to get up.  Client reported she tries to utilize her time while her son is at school by keeping her mind occupied with devotional's, TV shows, or games on her phone.  Client reported she has applied for disability and is awaiting to hear back about  their decision.  Client reported her social interaction is limited by her mother checks in with her during the week to help get her out of the house. ?Evidence of progress towards goal: Client reported she has identified coping skills that she enjoys during the day at least 5 out of 7 days during the week.  Client reported she works 2 reframe her negative thoughts at least 5 out of 7 days a week coming to acceptance of things that are and are not in her control.  Client's PHQ score is now an 8. ? ? ?  06/28/2021  ?  2:22 PM 12/30/2020  ?  8:58 AM 10/02/2020  ?  8:31 AM 09/07/2020  ?  1:46 PM  ?GAD 7 : Generalized Anxiety Score  ?Nervous, Anxious, on Edge 0 0 0 1  ?Control/stop worrying 0 1 2 1   ?Worry too much - different things 0 1 2 1   ?Trouble relaxing 2 2 0 1  ?Restless 1 0 0 0  ?Easily annoyed or irritable 0 0 0 1  ?Afraid - awful might happen 0 0 0 1  ?Total GAD 7 Score 3 4 4 6   ?Anxiety Difficulty Not difficult at all Not difficult at all Not difficult at all Somewhat difficult  ? ?  ?Flowsheet Row Counselor from 06/28/2021 in [facility]  ?PHQ-9 Total Score 8  ? ?  ?  ? ?Suicidal/Homicidal: Nowithout intent/plan ? ?Therapist Response:  ?Therapist began the appointment asking the client how she has been doing since last seen. ?Therapist  used CBT to engage using active listening and positive emotional support towards her thoughts and feelings. ?Therapist used CBT to ask the client about stressors that caused depressive or anxiety symptoms. ?Therapist used CBT to normalize the clients emotional reaction and to discuss mindfulness skills. ?Therapist used CBT ask the client to identify her progress with frequency of use with coping skills with continued practice in her daily activity.    ?Therapist assigned client homework to continue practicing reframing thoughts and engaging in positive coping activities. ?Client was scheduled for next appointment. ? ? ? ?Plan: Return again in 5  weeks. ? ?Diagnosis: Bipolar 2, major depressive episode ? ?Collaboration of Care: Patient refused AEB none requested by the client at this time. ? ?Patient/Guardian was advised Release of Information must be obtained prior to any record release in order to collaborate their care with an outside provider. Patient/Guardian was advised if they have not already done so to contact the registration department to sign all necessary forms in order for [place] to release information regarding their care.  ? ?Consent: Patient/Guardian gives verbal consent for treatment and assignment of benefits for services provided during this visit. Patient/Guardian expressed understanding and agreed to proceed.  ? ?Neena Rhymes Jakhia Buxton, LCSW ?06/28/2021 ? ?

## 2021-07-15 ENCOUNTER — Encounter (HOSPITAL_COMMUNITY): Payer: Self-pay

## 2021-07-15 ENCOUNTER — Ambulatory Visit (HOSPITAL_COMMUNITY): Payer: Medicaid Other | Admitting: Clinical

## 2021-09-14 ENCOUNTER — Telehealth (HOSPITAL_COMMUNITY): Payer: Medicaid Other | Admitting: Psychiatry

## 2021-09-16 ENCOUNTER — Ambulatory Visit (INDEPENDENT_AMBULATORY_CARE_PROVIDER_SITE_OTHER): Payer: Medicaid Other | Admitting: Clinical

## 2021-09-16 DIAGNOSIS — F3181 Bipolar II disorder: Secondary | ICD-10-CM | POA: Diagnosis not present

## 2021-09-16 NOTE — Progress Notes (Unsigned)
   THERAPIST PROGRESS NOTE Virtual Visit via Video Note  I connected with Jade [name] on 09/16/21 at  9:00 AM EDT by a video enabled telemedicine application and verified that I am speaking with the correct person using two identifiers.  Location: Patient: home Provider: office   I discussed the limitations of evaluation and management by telemedicine and the availability of in person appointments. The patient expressed understanding and agreed to proceed.   Follow Up Instructions: I discussed the assessment and treatment plan with the patient. The patient was provided an opportunity to ask questions and all were answered. The patient agreed with the plan and demonstrated an understanding of the instructions.   The patient was advised to call back or seek an in-person evaluation if the symptoms worsen or if the condition fails to improve as anticipated.   Session Time: ***  Participation Level: {BHH PARTICIPATION LEVEL:22264}  Behavioral Response: {Appearance:22683}{BHH LEVEL OF CONSCIOUSNESS:22305}{BHH MOOD:22306}  Type of Therapy: {CHL AMB BH Type of Therapy:21022741}  Treatment Goals addressed: ***  ProgressTowards Goals: {Progress Towards Goals:21014066}  Interventions: {CHL AMB BH Type of Intervention:21022753}  Summary:  Jade [name] is a 46 y.o. female who presents for the scheduled session oriented x5, appropriately dressed, and friendly.  Client denied hallucinations and delusions. Client reported since she was last seen the lack of changes has occurred in her life.  Client reported due to her inability to work in relation to her chronic physical pain she has had to move out of her home and move in with her mother.  Client reported she and her son have been living with her mother since April 2023.  Client reported she has also since applied for disability.  Client reported her mobility to do daily activities has been very limited.  Client reported to transition to  living with her mother questions some difficulty due to differences of opinion on different things. Client reported otherwise she has ben compliant with her psychiatric medications but does not feel it has been effective to help manage her symptoms. Client reported she does not have a "happy medium". Client reported feeling at a constant low described as constant worry, depressed mood, irritability. Evidence of progress towards goal:  client reported medication compliance 7 out of 7 days per week.      Suicidal/Homicidal: Nowithout intent/plan  Therapist Response:  Therapist began the appointment asking client how she has been doing since last seen. Therapist used CBT to engage using active listening and positive emotional support towards her thoughts and feelings.     Plan: Return again in *** weeks.  Diagnosis: No diagnosis found.  Collaboration of Care: {BH OP Collaboration of Care:21014065}  Patient/Guardian was advised Release of Information must be obtained prior to any record release in order to collaborate their care with an outside provider. Patient/Guardian was advised if they have not already done so to contact the registration department to sign all necessary forms in order for [place] to release information regarding their care.   Consent: Patient/Guardian gives verbal consent for treatment and assignment of benefits for services provided during this visit. Patient/Guardian expressed understanding and agreed to proceed.   Neena Rhymes Sinclair Alligood, LCSW 09/16/2021

## 2021-09-24 ENCOUNTER — Ambulatory Visit (INDEPENDENT_AMBULATORY_CARE_PROVIDER_SITE_OTHER): Payer: Medicaid Other | Admitting: Clinical

## 2021-09-24 DIAGNOSIS — F3181 Bipolar II disorder: Secondary | ICD-10-CM

## 2021-09-25 NOTE — Plan of Care (Signed)
  Problem: Depression CCP Problem  1  Goal: STG: Clint WILL PARTICIPATE IN AT LEAST 80% OF SCHEDULED INDIVIDUAL PSYCHOTHERAPY SESSIONS Outcome: Progressing Goal: STG: Marvella WILL COMPLETE AT LEAST 80% OF ASSIGNED HOMEWORK Outcome: Progressing   Problem: Depression CCP Problem  1  Goal: LTG: Julia WILL SCORE LESS THAN 10 ON THE PATIENT HEALTH QUESTIONNAIRE (PHQ-9) Outcome: Not Progressing

## 2021-09-25 NOTE — Progress Notes (Signed)
THERAPIST PROGRESS NOTE Virtual Visit via Video Note  I connected with Jade [name] on 09/24/2021 at  9:00 AM EDT by a video enabled telemedicine application and verified that I am speaking with the correct person using two identifiers.  Location: Patient: home Provider: office   I discussed the limitations of evaluation and management by telemedicine and the availability of in person appointments. The patient expressed understanding and agreed to proceed.  Follow Up Instructions: I discussed the assessment and treatment plan with the patient. The patient was provided an opportunity to ask questions and all were answered. The patient agreed with the plan and demonstrated an understanding of the instructions.   The patient was advised to call back or seek an in-person evaluation if the symptoms worsen or if the condition fails to improve as anticipated.    Session Time: 16 minutes  Participation Level: Active  Behavioral Response: CasualAlertDepressed  Type of Therapy: Individual Therapy  Treatment Goals addressed: Client will complete 80% of assigned homework  ProgressTowards Goals: Progressing  Interventions: CBT and Supportive  Summary:  Jade [name] is a 46 y.o. female who presents for the scheduled session oriented x5, appropriately dressed, and friendly.  Client denies hallucinations and delusions. Client reported on today she has been maintaining fairly well.  Client reported since her last appointment she has been attending other medical appointments regarding her disability.  Client reported can feel overwhelming keeping up with everything but she is doing her best.  Client reported she spends most of her time at home due to limited mobility but will leave the house if she needs to and no essential task.  Client reported she continues to be medication compliant until she sees a psychiatrist for her next medication checkup.  Client reported she is experiencing highs  and lows does not think that medication is as effective as it once was.  Client reported she is able to maintain emotional stability by redirecting her focus to spend time with her child or other activities within the house. Evidence of progress towards goal: Client is medication compliant 7 out of 7 days a week.   Suicidal/Homicidal: Nowithout intent/plan  Therapist Response:  Therapist began the appointment asking client how she has been doing since last week. Therapist used CBT to engage using active listening and positive emotional support. Therapist used CBT to ask client open-ended questions about how she is managing her stressors and how it affects her emotional wellbeing. Therapist used CBT to engage the client discussed behavioral activation to help manage her symptoms. Therapist used CBT ask the client to identify her progress with frequency of use with coping skills with continued practice in her daily activity.    Therapist assigned client homework to discuss her medication side effects or lack of effectiveness with the psychiatrist at the next scheduled appointment as well as continuing self-care. Client was scheduled for next appointment.    Plan: Return again in 5 weeks.  Diagnosis: Bipolar 2 disorder, major depressive episode  Collaboration of Care: Patient refused AEB none requested by the client.  Patient/Guardian was advised Release of Information must be obtained prior to any record release in order to collaborate their care with an outside provider. Patient/Guardian was advised if they have not already done so to contact the registration department to sign all necessary forms in order for [place] to release information regarding their care.   Consent: Patient/Guardian gives verbal consent for treatment and assignment of benefits for services provided during this  visit. Patient/Guardian expressed understanding and agreed to proceed.   Neena Rhymes Maddoxx Burkitt, LCSW 09/24/2021

## 2021-09-27 ENCOUNTER — Telehealth (INDEPENDENT_AMBULATORY_CARE_PROVIDER_SITE_OTHER): Payer: Medicaid Other | Admitting: Psychiatry

## 2021-09-27 ENCOUNTER — Encounter (HOSPITAL_COMMUNITY): Payer: Self-pay | Admitting: Psychiatry

## 2021-09-27 DIAGNOSIS — F3181 Bipolar II disorder: Secondary | ICD-10-CM | POA: Diagnosis not present

## 2021-09-27 DIAGNOSIS — F419 Anxiety disorder, unspecified: Secondary | ICD-10-CM | POA: Diagnosis not present

## 2021-09-27 MED ORDER — GABAPENTIN 300 MG PO CAPS: 300.0000 mg | ORAL_CAPSULE | Freq: Three times a day (TID) | ORAL | 3 refills | Status: DC

## 2021-09-27 MED ORDER — TRAZODONE HCL 50 MG PO TABS: 50.0000 mg | ORAL_TABLET | Freq: Every day | ORAL | 3 refills | Status: DC

## 2021-09-27 MED ORDER — LAMOTRIGINE 100 MG PO TABS: 100.0000 mg | ORAL_TABLET | Freq: Every day | ORAL | 3 refills | Status: DC

## 2021-09-27 MED ORDER — BUPROPION HCL ER (XL) 150 MG PO TB24: 150.0000 mg | ORAL_TABLET | ORAL | 3 refills | Status: DC

## 2021-09-27 NOTE — Progress Notes (Signed)
BH MD/PA/NP OP Progress Note Virtual Visit via Video Note  I connected with Jade [name] on 09/27/21 at  2:30 PM EDT by a video enabled telemedicine application and verified that I am speaking with the correct person using two identifiers.  Location: Patient: Home Provider: Clinic   I discussed the limitations of evaluation and management by telemedicine and the availability of in person appointments. The patient expressed understanding and agreed to proceed.  I provided 30 minutes of non-face-to-face time during this encounter.   09/27/2021 2:45 PM Jade [name]  MRN:  [id number]  Chief Complaint: "I am not sure if my medications are working"  HPI: 46 year old female seen today for follow up psychiatry evaluation.  She has a psychiatric history of PTSD, Depression, Bipolar 2, Anxiety, and SI. She currently managed on Lamictal 100 mg daily, Wellbutrin XL 150 daily, and hydroxyzine 10 mg 3 times daily as needed. She notes her medications are somewhat effective in managing her psychiatric conditions.    Today, she is pleasant, cooperative, engaged in conversation, and maintained eye contact. She informed Clinical research associate that she is unsure if her medications are working.  She informed Clinical research associate that she has been experiencing symptoms of hypomania such as distractibility, irritability, fluctuations in mood, and racing thoughts.  She also notes that her anxiety and depression has worsened since her last visit.  She notes that she worries about her finances and approval for disability.  Patient informed Clinical research associate that she is been unable to return to work and is nervous about returning if her disability is not approved.  Today provider conducted a GAD-7 and patient scored a 16.  Provider also conducted PHQ-9 and patient scored a 20.  She endorses fluctuations in appetite and notes that her weight has to been fluctuating.  Today she denies SI/HI/AVH, mania, paranoia.  She inform writer that her sleep has been poor  noting that she sleeps 4 hours most  At this time patient reports that she would also like to discontinue hydroxyzine.  She is agreeable to restarting trazodone 50 mg nightly to help manage sleep.  She is also agreeable to starting gabapentin 300 mg 3 times daily to help manage mood, anxiety, and sleep.  Potential side effects of medication and risks vs benefits of treatment vs non-treatment were explained and discussed. All questions were answered.Provider recommended increasing Lamictal in next visit if mood does not improve.  She endorsed understanding and agreed.  She will follow-up with outpatient counseling for therapy.  No other burns noted at this time   Visit Diagnosis:    ICD-10-CM   1. Anxiety  F41.9 gabapentin (NEURONTIN) 300 MG capsule    traZODone (DESYREL) 50 MG tablet    2. Bipolar 2 disorder, major depressive episode ([name])  F31.81 gabapentin (NEURONTIN) 300 MG capsule    traZODone (DESYREL) 50 MG tablet    buPROPion (WELLBUTRIN XL) 150 MG 24 hr tablet    lamoTRIgine (LAMICTAL) 100 MG tablet      Past Psychiatric History: PTSD, Depression, Bipolar 2, Anxiety, and SI  Past Medical History:  Past Medical History:  Diagnosis Date   Abnormal Pap smear of cervix    Anxiety    Depression    Vitamin D deficiency 04/2019    Past Surgical History:  Procedure Laterality Date   CERVICAL BIOPSY  W/ LOOP ELECTRODE EXCISION  03-2013   CESAREAN SECTION     x 1   COLPOSCOPY     TUBAL LIGATION  Family Psychiatric History:  Mom- depression, anxiety  Family History:  Family History  Problem Relation Age of Onset   Hypertension Mother    Diabetes Mother    Hyperlipidemia Brother    Pancreatic cancer Maternal Grandmother    Diabetes Maternal Grandmother     Social History:  Social History   Socioeconomic History   Marital status: Single    Spouse name: Not on file   Number of children: 2   Years of education: 14   Highest education level: Some college, no degree   Occupational History   Not on file  Tobacco Use   Smoking status: Never   Smokeless tobacco: Never  Vaping Use   Vaping Use: Never used  Substance and Sexual Activity   Alcohol use: No    Comment: socially    Drug use: No   Sexual activity: Not Currently    Birth control/protection: Surgical  Other Topics Concern   Not on file  Social History Narrative   Marital status: single; dating seriously x 7 years.      Children:  2 children (14, 6)      Lives: with two sons      Employment:  Conservation officer, nature at [employer]; first shift for 8 years.      Tobacco: none      Alcohol:  4-6 drinks per month      Drugs: none      Exercise:  Sporadic; twice weekly; walking      Seatbelt: 100%      Guns: none      Sexual activity:  Total partners = 6; history of Chlamydia in 2002; last STD screening 2016; males only   Social Determinants of Health   Financial Resource Strain: Not on file  Food Insecurity: Not on file  Transportation Needs: Not on file  Physical Activity: Not on file  Stress: Not on file  Social Connections: Not on file    Allergies: No Known Allergies  Metabolic Disorder Labs: Lab Results  Component Value Date   HGBA1C 6.3 (H) 08/31/2020   MPG 134 08/31/2020   MPG 120 (H) 11/17/2014   No results found for: "PROLACTIN" Lab Results  Component Value Date   CHOL 195 08/31/2020   TRIG 92 08/31/2020   HDL 49 08/31/2020   CHOLHDL 4.0 08/31/2020   VLDL 18 08/31/2020   LDLCALC 128 (H) 08/31/2020   LDLCALC 113 (H) 05/24/2019   Lab Results  Component Value Date   TSH 1.315 08/31/2020   TSH 0.572 05/24/2019    Therapeutic Level Labs: No results found for: "LITHIUM" No results found for: "VALPROATE" No results found for: "CBMZ"  Current Medications: Current Outpatient Medications  Medication Sig Dispense Refill   gabapentin (NEURONTIN) 300 MG capsule Take 1 capsule (300 mg total) by mouth 3 (three) times daily. 90 capsule 3   traZODone (DESYREL) 50 MG tablet Take 1  tablet (50 mg total) by mouth at bedtime. 30 tablet 3   buPROPion (WELLBUTRIN XL) 150 MG 24 hr tablet Take 1 tablet (150 mg total) by mouth every morning. 30 tablet 3   diclofenac (VOLTAREN) 75 MG EC tablet Take 75 mg by mouth 2 (two) times daily.     HYDROcodone-acetaminophen (NORCO) 10-325 MG tablet Take 1 tablet by mouth every 6 (six) hours as needed.     lamoTRIgine (LAMICTAL) 100 MG tablet Take 1 tablet (100 mg total) by mouth daily. For mood stabilization 30 tablet 3   Vitamin D, Ergocalciferol, (DRISDOL) 1.25 MG (  50000 UNIT) CAPS capsule Take 50,000 Units by mouth every 7 (seven) days.     No current facility-administered medications for this visit.     Musculoskeletal: Strength & Muscle Tone: within normal limits and  telehealth visit Gait & Station: normal,  telehealth visit Patient leans: N/A  Psychiatric Specialty Exam: Review of Systems  There were no vitals taken for this visit.There is no height or weight on file to calculate BMI.  General Appearance: Well Groomed  Eye Contact:  Good  Speech:  Clear and Coherent and Normal Rate  Volume:  Normal  Mood:  Anxious and Depressed  Affect:  Appropriate and Congruent  Thought Process:  Coherent, Goal Directed, and NA  Orientation:  Full (Time, Place, and Person)  Thought Content: WDL and Logical   Suicidal Thoughts:  No  Homicidal Thoughts:  No  Memory:  Immediate;   Good Recent;   Good Remote;   Good  Judgement:  Good  Insight:  Good  Psychomotor Activity:  Normal  Concentration:  Concentration: Good and Attention Span: Good  Recall:  Good  Fund of Knowledge: Good  Language: Good  Akathisia:  No  Handed:  Right  AIMS (if indicated): not done  Assets:  Communication Skills Desire for Improvement Financial Resources/Insurance Housing Leisure Time Social Support  ADL's:  Intact  Cognition: WNL  Sleep:  Fair   Screenings: GAD-7    Flowsheet Row Video Visit from 09/27/2021 in [facility] Counselor from 06/28/2021 in [facility] Video Visit from 12/30/2020 in [facility] Video Visit from 10/02/2020 in [facility] Video Visit from 09/07/2020 in [facility]  Total GAD-7 Score 16 3 4 4 6       PHQ2-9    Flowsheet Row Video Visit from 09/27/2021 in [facility] Counselor from 06/28/2021 in [facility] Video Visit from 12/30/2020 in [facility] Video Visit from 10/02/2020 in [facility] Video Visit from 09/07/2020 in [facility]  PHQ-2 Total Score 6 1 0 1 2  PHQ-9 Total Score 20 8 4 5 8       Flowsheet Row Video Visit from 10/02/2020 in [facility] Video Visit from 09/07/2020 in [facility] Admission (Discharged) [facility]  C-SSRS RISK CATEGORY Error: Question 6 not populated Error: Q3, 4, or 5 should not be populated when Q2 is No Error: Q3, 4, or 5 should not be populated when Q2 is No        Assessment and Plan: Patient endorses symptoms of anxiety, insomnia, and bipolar disorder.At this time patient reports that she would also like to discontinue hydroxyzine.  She is agreeable to restarting trazodone 50 mg nightly to help manage sleep.  She is also agreeable to starting gabapentin 300 mg 3 times daily to help manage mood, anxiety, and sleep.  Provider recommended increasing Lamictal in next visit if mood does not improve.  She endorsed understanding and agreed.   1. Bipolar 2 disorder, major depressive episode ([name])  Start- gabapentin (NEURONTIN) 300 MG capsule; Take 1 capsule (300 mg total) by mouth 3 (three) times daily.  Dispense: 90 capsule; Refill: 3 Restart- traZODone (DESYREL) 50 MG tablet; Take 1 tablet (50 mg total)  by mouth at bedtime.  Dispense: 30 tablet; Refill: 3 Continue- buPROPion (WELLBUTRIN XL) 150  MG 24 hr tablet; Take 1 tablet (150 mg total) by mouth every morning.  Dispense: 30 tablet; Refill: 3 Continue- lamoTRIgine (LAMICTAL) 100 MG tablet; Take 1 tablet (100 mg total) by mouth daily. For mood stabilization  Dispense: 30 tablet; Refill: 3  2. Anxiety  Start- gabapentin (NEURONTIN) 300 MG capsule; Take 1 capsule (300 mg total) by mouth 3 (three) times daily.  Dispense: 90 capsule; Refill: 3 Restart- traZODone (DESYREL) 50 MG tablet; Take 1 tablet (50 mg total) by mouth at bedtime.  Dispense: 30 tablet; Refill: 3  Follow up in 3 months Follow up with therapy  Shanna Cisco, NP 09/27/2021, 2:45 PM

## 2021-11-16 ENCOUNTER — Telehealth (HOSPITAL_COMMUNITY): Payer: Self-pay | Admitting: *Deleted

## 2021-11-16 NOTE — Telephone Encounter (Signed)
PATIENT CALLED STATED SHE LEFT PAPER WORK @ [employer] FOR [employer] PARSONS --- CALLED PATIENT TO F/U ON WHAT TYPE OF FORMS WAS LEFT -- & PATIENT STATED THAT FORMS ARE FOR LIFE INSURANCE COVERAGE & ASKED FOR A DIAGNOSIS

## 2021-11-17 NOTE — Telephone Encounter (Signed)
Provider called patient and was informed that she is still in the process of getting disability.  She request that provider fill out a disability claims form which provider was agreeable to.  Provider filled form out and informed patient that she could pick it up at her convenience.  No other concerns at this time.

## 2021-11-30 ENCOUNTER — Ambulatory Visit (HOSPITAL_COMMUNITY): Payer: Medicaid Other | Admitting: Clinical

## 2021-12-30 ENCOUNTER — Telehealth (INDEPENDENT_AMBULATORY_CARE_PROVIDER_SITE_OTHER): Payer: Medicaid Other | Admitting: Psychiatry

## 2021-12-30 ENCOUNTER — Encounter (HOSPITAL_COMMUNITY): Payer: Self-pay | Admitting: Psychiatry

## 2021-12-30 DIAGNOSIS — F3181 Bipolar II disorder: Secondary | ICD-10-CM

## 2021-12-30 DIAGNOSIS — F419 Anxiety disorder, unspecified: Secondary | ICD-10-CM | POA: Diagnosis not present

## 2021-12-30 MED ORDER — BUPROPION HCL ER (XL) 150 MG PO TB24: 150.0000 mg | ORAL_TABLET | ORAL | 3 refills | Status: DC

## 2021-12-30 MED ORDER — GABAPENTIN 400 MG PO CAPS: 400.0000 mg | ORAL_CAPSULE | Freq: Four times a day (QID) | ORAL | 3 refills | Status: DC

## 2021-12-30 MED ORDER — LAMOTRIGINE 150 MG PO TABS: 150.0000 mg | ORAL_TABLET | Freq: Every day | ORAL | 3 refills | Status: DC

## 2021-12-30 MED ORDER — TRAZODONE HCL 100 MG PO TABS: 100.0000 mg | ORAL_TABLET | Freq: Every day | ORAL | 3 refills | Status: DC

## 2021-12-30 NOTE — Progress Notes (Signed)
BH MD/PA/NP OP Progress Note Virtual Visit via Telephone Note  I connected with Jade [name] on 12/30/21 at  2:00 PM EDT by telephone and verified that I am speaking with the correct person using two identifiers.  Location: Patient: home Provider: Clinic   I discussed the limitations, risks, security and privacy concerns of performing an evaluation and management service by telephone and the availability of in person appointments. I also discussed with the patient that there may be a patient responsible charge related to this service. The patient expressed understanding and agreed to proceed.   I provided 30 minutes of non-face-to-face time during this encounter.    12/30/2021 11:53 AM Jade [name]  MRN:  [id number]  Chief Complaint: "I have been having a hard time the last two to three weeks"  HPI: 46 year old female seen today for follow up psychiatry evaluation.  She has a psychiatric history of PTSD, Depression, Bipolar 2, Anxiety, and SI. She currently managed on Lamictal 100 mg daily, gabapentin 300 mg three times daily, Wellbutrin XL 150 daily, and Trazodone 50 mg nightly as needed. She notes her medications are somewhat effective in managing her psychiatric conditions.    Today, she was unable to login virtually so her assessment was done over the phone. During exam she was pleasant, cooperative, and engaged in conversation. She informed Clinical research associate that that last two to three weeks things has been hectic. She notes that she has had to move back in with her mother and their relationship is not the best. Patient also notes that she is waiting on her disability to be approved which she reports is stressful.  She continues to endorse symptoms of hypomania such as distractibility, irritability, racing thoughts, and fluctuations in mood.  Patient notes that her sleep is poor.  She notes that she sleeps 4 to 6 hours nightly.  Patient endorses passive SI but denies wanting to harm herself  today.  She denies SI/HI/AVH or paranoia.    Patient reports that her anxiety continues to be problematic.  Provider conducted a GAD-7 and patient scored a 19, at her last visit she scored a 16.  Provider also conducted PHQ-9 and patient scored a 25, at her last visit she scored a 20.  She endorses poor appetite but denies weight loss.    Patient reports that she continues to be in pain.  She notes that gabapentin, Voltaren, and hydrocodone are somewhat effective in managing her pain.  She informed Clinical research associate that she will be following up with her PCP for pain management soon.  Today she is agreeable to increasing Lamictal 100 mg to 150 mg to help stabilize mood.  She is also agreeable to increasing gabapentin 300 mg 3 times daily to 400 mg 3 times daily to help manage mood and anxiety.  Patient is agreeable to increasing trazodone 50 mg nightly as needed to 100 mg nightly as needed to help with sleep.  She will continue her other medications as prescribed.  No other concerns at this time.  Visit Diagnosis:    ICD-10-CM   1. Bipolar 2 disorder, major depressive episode ([name])  F31.81 buPROPion (WELLBUTRIN XL) 150 MG 24 hr tablet    gabapentin (NEURONTIN) 400 MG capsule    lamoTRIgine (LAMICTAL) 150 MG tablet    traZODone (DESYREL) 100 MG tablet    2. Anxiety  F41.9 gabapentin (NEURONTIN) 400 MG capsule    traZODone (DESYREL) 100 MG tablet      Past Psychiatric History: PTSD,  Depression, Bipolar 2, Anxiety, and SI  Past Medical History:  Past Medical History:  Diagnosis Date   Abnormal Pap smear of cervix    Anxiety    Depression    Vitamin D deficiency 04/2019    Past Surgical History:  Procedure Laterality Date   CERVICAL BIOPSY  W/ LOOP ELECTRODE EXCISION  03-2013   CESAREAN SECTION     x 1   COLPOSCOPY     TUBAL LIGATION      Family Psychiatric History:  Mom- depression, anxiety  Family History:  Family History  Problem Relation Age of Onset   Hypertension Mother     Diabetes Mother    Hyperlipidemia Brother    Pancreatic cancer Maternal Grandmother    Diabetes Maternal Grandmother     Social History:  Social History   Socioeconomic History   Marital status: Single    Spouse name: Not on file   Number of children: 2   Years of education: 14   Highest education level: Some college, no degree  Occupational History   Not on file  Tobacco Use   Smoking status: Never   Smokeless tobacco: Never  Vaping Use   Vaping Use: Never used  Substance and Sexual Activity   Alcohol use: No    Comment: socially    Drug use: No   Sexual activity: Not Currently    Birth control/protection: Surgical  Other Topics Concern   Not on file  Social History Narrative   Marital status: single; dating seriously x 7 years.      Children:  2 children (14, 6)      Lives: with two sons      Employment:  Conservation officer, nature at [employer]; first shift for 8 years.      Tobacco: none      Alcohol:  4-6 drinks per month      Drugs: none      Exercise:  Sporadic; twice weekly; walking      Seatbelt: 100%      Guns: none      Sexual activity:  Total partners = 6; history of Chlamydia in 2002; last STD screening 2016; males only   Social Determinants of Health   Financial Resource Strain: Not on file  Food Insecurity: Not on file  Transportation Needs: Not on file  Physical Activity: Not on file  Stress: Not on file  Social Connections: Not on file    Allergies: No Known Allergies  Metabolic Disorder Labs: Lab Results  Component Value Date   HGBA1C 6.3 (H) 08/31/2020   MPG 134 08/31/2020   MPG 120 (H) 11/17/2014   No results found for: "PROLACTIN" Lab Results  Component Value Date   CHOL 195 08/31/2020   TRIG 92 08/31/2020   HDL 49 08/31/2020   CHOLHDL 4.0 08/31/2020   VLDL 18 08/31/2020   LDLCALC 128 (H) 08/31/2020   LDLCALC 113 (H) 05/24/2019   Lab Results  Component Value Date   TSH 1.315 08/31/2020   TSH 0.572 05/24/2019    Therapeutic Level Labs: No  results found for: "LITHIUM" No results found for: "VALPROATE" No results found for: "CBMZ"  Current Medications: Current Outpatient Medications  Medication Sig Dispense Refill   buPROPion (WELLBUTRIN XL) 150 MG 24 hr tablet Take 1 tablet (150 mg total) by mouth every morning. 30 tablet 3   diclofenac (VOLTAREN) 75 MG EC tablet Take 75 mg by mouth 2 (two) times daily.     gabapentin (NEURONTIN) 400 MG capsule Take  1 capsule (400 mg total) by mouth 4 (four) times daily. 90 capsule 3   HYDROcodone-acetaminophen (NORCO) 10-325 MG tablet Take 1 tablet by mouth every 6 (six) hours as needed.     lamoTRIgine (LAMICTAL) 150 MG tablet Take 1 tablet (150 mg total) by mouth daily. For mood stabilization 30 tablet 3   traZODone (DESYREL) 100 MG tablet Take 1 tablet (100 mg total) by mouth at bedtime. 30 tablet 3   Vitamin D, Ergocalciferol, (DRISDOL) 1.25 MG (50000 UNIT) CAPS capsule Take 50,000 Units by mouth every 7 (seven) days.     No current facility-administered medications for this visit.     Musculoskeletal: Strength & Muscle Tone:  Unable to assess due to telephone visit Gait & Station:  Unable to assess due to telephone visit Patient leans: N/A  Psychiatric Specialty Exam: Review of Systems  There were no vitals taken for this visit.There is no height or weight on file to calculate BMI.  General Appearance:  Unable to assess due to telephone visit  Eye Contact:   Unable to assess due to telephone visit  Speech:  Clear and Coherent and Normal Rate  Volume:  Normal  Mood:  Anxious and Depressed  Affect:  Appropriate and Congruent  Thought Process:  Coherent, Goal Directed, and NA  Orientation:  Full (Time, Place, and Person)  Thought Content: WDL and Logical   Suicidal Thoughts:  No  Homicidal Thoughts:  No  Memory:  Immediate;   Good Recent;   Good Remote;   Good  Judgement:  Good  Insight:  Good  Psychomotor Activity:   Unable to assess due to telephone visit   Concentration:  Concentration: Good and Attention Span: Good  Recall:  Good  Fund of Knowledge: Good  Language: Good  Akathisia:  No  Handed:  Right  AIMS (if indicated): not done  Assets:  Communication Skills Desire for Improvement Financial Resources/Insurance Housing Leisure Time Social Support  ADL's:  Intact  Cognition: WNL  Sleep:  Fair   Screenings: GAD-7    Flowsheet Row Video Visit from 12/30/2021 in [facility] Video Visit from 09/27/2021 in [facility] Counselor from 06/28/2021 in [facility] Video Visit from 12/30/2020 in [facility] Video Visit from 10/02/2020 in [facility]  Total GAD-7 Score 19 16 3 4 4       PHQ2-9    Flowsheet Row Video Visit from 12/30/2021 in [facility] Video Visit from 09/27/2021 in [facility] Counselor from 06/28/2021 in [facility] Video Visit from 12/30/2020 in [facility] Video Visit from 10/02/2020 in [place]  PHQ-2 Total Score 6 6 1  0 1  PHQ-9 Total Score 25 20 8 4 5       Flowsheet Row Video Visit from 12/30/2021 in [facility] Video Visit from 10/02/2020 in [facility] Video Visit from 09/07/2020 in [place] Error: Q7 should not be populated when Q6 is No Error: Question 6 not populated Error: Q3, 4, or 5 should not be populated when Q2 is No        Assessment and Plan: Patient endorses symptoms of anxiety, insomnia, and hypomania.  Today she is agreeable to increasing Lamictal 100 mg to 150 mg to help stabilize mood.  She is also agreeable to increasing  gabapentin 300 mg 3 times daily to 400 mg 3 times daily to help manage mood and anxiety.  Patient  is agreeable to increasing trazodone 50 mg nightly as needed to 100 mg nightly as needed to help with sleep.  She will continue her other medications as prescribed.  No other concerns at this time.  1. Bipolar 2 disorder, major depressive episode ([name])  Continue- buPROPion (WELLBUTRIN XL) 150 MG 24 hr tablet; Take 1 tablet (150 mg total) by mouth every morning.  Dispense: 30 tablet; Refill: 3 Increased- gabapentin (NEURONTIN) 400 MG capsule; Take 1 capsule (400 mg total) by mouth 4 (four) times daily.  Dispense: 90 capsule; Refill: 3 Increased- lamoTRIgine (LAMICTAL) 150 MG tablet; Take 1 tablet (150 mg total) by mouth daily. For mood stabilization  Dispense: 30 tablet; Refill: 3 Increased- traZODone (DESYREL) 100 MG tablet; Take 1 tablet (100 mg total) by mouth at bedtime.  Dispense: 30 tablet; Refill: 3  2. Anxiety  Increased- gabapentin (NEURONTIN) 400 MG capsule; Take 1 capsule (400 mg total) by mouth 4 (four) times daily.  Dispense: 90 capsule; Refill: 3 Increased- traZODone (DESYREL) 100 MG tablet; Take 1 tablet (100 mg total) by mouth at bedtime.  Dispense: 30 tablet; Refill: 3  Follow up in 3 months Follow up with therapy  Shanna Cisco, NP 12/30/2021, 11:53 AM

## 2022-01-01 ENCOUNTER — Other Ambulatory Visit: Payer: Self-pay | Admitting: Nurse Practitioner

## 2022-01-01 DIAGNOSIS — Z1231 Encounter for screening mammogram for malignant neoplasm of breast: Secondary | ICD-10-CM

## 2022-01-03 ENCOUNTER — Ambulatory Visit (INDEPENDENT_AMBULATORY_CARE_PROVIDER_SITE_OTHER): Payer: Medicaid Other | Admitting: Clinical

## 2022-01-03 DIAGNOSIS — F3181 Bipolar II disorder: Secondary | ICD-10-CM

## 2022-01-03 NOTE — Progress Notes (Addendum)
THERAPIST PROGRESS NOTE Virtual Visit via Video Note  I connected with Jade [name] on 01/03/2022 at  9:00 AM EDT by a video enabled telemedicine application and verified that I am speaking with the correct person using two identifiers.  Location: Patient: home Provider: office   I discussed the limitations of evaluation and management by telemedicine and the availability of in person appointments. The patient expressed understanding and agreed to proceed.   Follow Up Instructions: I discussed the assessment and treatment plan with the patient. The patient was provided an opportunity to ask questions and all were answered. The patient agreed with the plan and demonstrated an understanding of the instructions.   The patient was advised to call back or seek an in-person evaluation if the symptoms worsen or if the condition fails to improve as anticipated.   Session Time: 40 minutes  Participation Level: Active  Behavioral Response: CasualAlertDepressed  Type of Therapy: Individual Therapy  Treatment Goals addressed: client will complete 80% of assigned homework  ProgressTowards Goals: Progressing  Interventions: CBT and Supportive  Summary:  Jade [name] is a 46 y.o. female who presents for the scheduled appointment oriented times five, appropriately dressed, and friendly. Client denied hallucinations and delusions. Client reported on today she has not been doing good mentally over the past month.  Client reported she was denied disability which she was expecting.  Client reported she put an application for them to review her paperwork over again to see if they will change the decision.  Client reported otherwise that she has been dealing with the dilemma of wanting to proceed with the disability because she mentally and physically has challenges that keep her from sustaining a job or trying to figure out a way to generate some income for herself so she can have her own housing  and provide for her son. Client reported she has been having a lot of stress dealing with the lack of support from family and/or friends. Client reported there have been a few times where she considered seeking inpatient treatment. Client reported she feels like she cannot "breakdown properly" because of negative opinions and expectations put on her by others. Client reported having passive suicidal ideations but no plan or intent. Client reported it has been tough having to cut off so many people but she knows she will be better for it. Client reported she overlooked a lot of things about people to please them ut has been told she's "crazy" and "needs to get over it" by others who she thought were friends. Evidence of progress towards goal:  Client reported use of 1 positive skill which is cognitive reframing to help with depressive symptoms at least 3 out of 7 days per week.  Suicidal/Homicidal: Nowithout intent/plan  Therapist Response:  Therapist began the appointment asking the client how she has been doing. Therapist used active listening and positive emotional support to engage with the client. Therapist used CBT to ask open ended questions for the client to describe the source of her depressive thoughts and emotions. Therapist used CBT to engage and normalize the clients emotional response. Therapist to discuss emotional boundaries and reframing negative thoughts. Therapist used CBT ask the client to identify her progress with frequency of use with coping skills with continued practice in her daily activity.    Therapist assigned the client homework to practice boundaries and positive behavioral activation.   Plan: Return again in 3 weeks.  Diagnosis: bipolar 2 disorder, major depressive episode  Collaboration of Care: Other client asked for the therapist/ psychiatrist to sign her paperwork for [facility] she was referred to by her PCP.  Patient/Guardian was advised  Release of Information must be obtained prior to any record release in order to collaborate their care with an outside provider. Patient/Guardian was advised if they have not already done so to contact the registration department to sign all necessary forms in order for [place] to release information regarding their care.   Consent: Patient/Guardian gives verbal consent for treatment and assignment of benefits for services provided during this visit. Patient/Guardian expressed understanding and agreed to proceed.   [place], LCSW 01/03/2022

## 2022-01-16 NOTE — Plan of Care (Signed)
  Problem: Depression CCP Problem  1  Goal: LTG: Jade [name] WILL SCORE LESS THAN 10 ON THE PATIENT HEALTH QUESTIONNAIRE (PHQ-9) Outcome: Progressing Goal: STG: Jade [name] WILL PARTICIPATE IN AT LEAST 80% OF SCHEDULED INDIVIDUAL PSYCHOTHERAPY SESSIONS Outcome: Progressing Goal: STG: Jade [name] WILL COMPLETE AT LEAST 80% OF ASSIGNED HOMEWORK Outcome: Progressing

## 2022-01-19 ENCOUNTER — Ambulatory Visit (INDEPENDENT_AMBULATORY_CARE_PROVIDER_SITE_OTHER): Payer: Medicaid Other | Admitting: Clinical

## 2022-01-19 DIAGNOSIS — F3181 Bipolar II disorder: Secondary | ICD-10-CM | POA: Diagnosis not present

## 2022-01-23 ENCOUNTER — Other Ambulatory Visit (HOSPITAL_COMMUNITY): Payer: Self-pay | Admitting: Psychiatry

## 2022-01-23 DIAGNOSIS — F3181 Bipolar II disorder: Secondary | ICD-10-CM

## 2022-01-24 ENCOUNTER — Other Ambulatory Visit (HOSPITAL_COMMUNITY): Payer: Self-pay | Admitting: Psychiatry

## 2022-01-24 DIAGNOSIS — F3181 Bipolar II disorder: Secondary | ICD-10-CM

## 2022-01-24 DIAGNOSIS — F419 Anxiety disorder, unspecified: Secondary | ICD-10-CM

## 2022-01-24 NOTE — Progress Notes (Signed)
THERAPIST PROGRESS NOTE Virtual Visit via Video Note  I connected with Jade [name] on 01/19/2022 at  1:00 PM EDT by a video enabled telemedicine application and verified that I am speaking with the correct person using two identifiers.  Location: Patient: home Provider: office   I discussed the limitations of evaluation and management by telemedicine and the availability of in person appointments. The patient expressed understanding and agreed to proceed.   Follow Up Instructions: I discussed the assessment and treatment plan with the patient. The patient was provided an opportunity to ask questions and all were answered. The patient agreed with the plan and demonstrated an understanding of the instructions.   The patient was advised to call back or seek an in-person evaluation if the symptoms worsen or if the condition fails to improve as anticipated.   Session Time: 25 minutes  Participation Level: Active  Behavioral Response: CasualAlertDepressed  Type of Therapy: Individual Therapy  Treatment Goals addressed: Client will participate in at least 80% of scheduled individual psychotherapy sessions  ProgressTowards Goals: Progressing  Interventions: CBT and Supportive  Summary:  Jade [name] is a 46 y.o. female who presents for the scheduled appointment oriented x5, appropriately dressed, and friendly.  Client denies hallucinations and delusions. Client reported on today she is doing pretty well.  Client reported she has been doing her best to "keep it together".  Client reported her youngest son will be going in to have an MRI completed next week.  Client reported out of the blue a few months ago he had an unexplained seizure at school and he has no prior history or pre-existing health concerns that are known of.  Client reported it has been stressful being concerned about him which she is taking things 1 step at a time.  Client reported otherwise she decided to interview  for job position.  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  Client reported she knows this with her decision with disability or even have her case thrown out but she is in a financial predicament where she has to figure out how to provide for her and her family.  Client reported her 53 year old son stated that he would come back home to help her and his younger brother out due to his medical issues.  Client reported one of the things that has been going on her is her youngest son because although he can go to his father side of the family they just want to care for him like she does. Evidence of progress towards goal: Client reported she uses 1 skill of attempting to positively reframe negative thoughts.  Client reported she is compliant with her medications 7 days out of the week.  Suicidal/Homicidal: Nowithout intent/plan  Therapist Response:  Therapist began the appointment asking client how she is been doing since last seen. Therapist used CBT to engage using active listening and positive emotional support. Therapist used CBT to ask client open-ended questions about how she is dealing with stressors related to family and other matters. Therapist used CBT to reinforce positively the client's ability to break things down in a way that manageable for her to handle. Therapist used CBT ask the client to identify her progress with frequency of use with coping skills with continued practice in her daily activity.    Therapist assigned client homework to practice self-care.   Plan: Return again in 3 weeks.  Diagnosis: Bipolar 2 disorder, major depressive episode  Collaboration of Care: Patient refused AEB none requested by the client.  Patient/Guardian was advised Release of Information must be obtained prior to any record release in order to collaborate their care with an outside provider. Patient/Guardian was  advised if they have not already done so to contact the registration department to sign all necessary forms in order for [place] to release information regarding their care.   Consent: Patient/Guardian gives verbal consent for treatment and assignment of benefits for services provided during this visit. Patient/Guardian expressed understanding and agreed to proceed.   [place], LCSW 01/19/2022

## 2022-01-24 NOTE — Plan of Care (Signed)
  Problem: Depression CCP Problem  1  Goal: LTG: Solace WILL SCORE LESS THAN 10 ON THE PATIENT HEALTH QUESTIONNAIRE (PHQ-9) Outcome: Progressing Goal: STG: Jade [name] WILL PARTICIPATE IN AT LEAST 80% OF SCHEDULED INDIVIDUAL PSYCHOTHERAPY SESSIONS Outcome: Progressing Goal: STG: Jade [name] WILL COMPLETE AT LEAST 80% OF ASSIGNED HOMEWORK Outcome: Progressing   

## 2022-02-01 ENCOUNTER — Other Ambulatory Visit: Payer: Self-pay | Admitting: *Deleted

## 2022-02-01 ENCOUNTER — Other Ambulatory Visit (HOSPITAL_COMMUNITY)
Admission: RE | Admit: 2022-02-01 | Discharge: 2022-02-01 | Disposition: A | Discharge status: Home / Self Care | Payer: Medicaid Other | Source: Ambulatory Visit | Attending: Obstetrics | Admitting: Obstetrics

## 2022-02-01 ENCOUNTER — Ambulatory Visit (INDEPENDENT_AMBULATORY_CARE_PROVIDER_SITE_OTHER): Payer: Medicaid Other | Admitting: Obstetrics

## 2022-02-01 ENCOUNTER — Encounter: Payer: Self-pay | Admitting: Obstetrics

## 2022-02-01 VITALS — BP 110/68 | HR 87 | Ht 62.0 in | Wt 263.0 lb

## 2022-02-01 DIAGNOSIS — Z0000 Encounter for general adult medical examination without abnormal findings: Secondary | ICD-10-CM

## 2022-02-01 DIAGNOSIS — Z01419 Encounter for gynecological examination (general) (routine) without abnormal findings: Secondary | ICD-10-CM | POA: Diagnosis present

## 2022-02-01 DIAGNOSIS — Z6841 Body Mass Index (BMI) 40.0 and over, adult: Secondary | ICD-10-CM

## 2022-02-01 DIAGNOSIS — N898 Other specified noninflammatory disorders of vagina: Secondary | ICD-10-CM | POA: Insufficient documentation

## 2022-02-01 DIAGNOSIS — Z113 Encounter for screening for infections with a predominantly sexual mode of transmission: Secondary | ICD-10-CM | POA: Diagnosis not present

## 2022-02-01 MED ORDER — FLUCONAZOLE 200 MG PO TABS: 200.0000 mg | ORAL_TABLET | ORAL | 2 refills | Status: AC

## 2022-02-01 MED ORDER — FLUCONAZOLE 200 MG PO TABS: 200.0000 mg | ORAL_TABLET | ORAL | 2 refills | Status: DC

## 2022-02-01 NOTE — Progress Notes (Signed)
Subjective:        Jade [name] is a 46 y.o. female here for a routine exam.  Current complaints: Vaginal discharge with itching and burning.  Personal health questionnaire:  Is patient [name], have a family history of breast and/or ovarian cancer: no Is there a family history of uterine cancer diagnosed at age < 47, gastrointestinal cancer, urinary tract cancer, family member who is a Field seismologist syndrome-associated carrier: no Is the patient overweight and hypertensive, family history of diabetes, personal history of gestational diabetes, preeclampsia or PCOS: yes Is patient over 46, have PCOS,  family history of premature CHD under age 54, diabetes, smoke, have hypertension or peripheral artery disease:  no At any time, has a partner hit, kicked or otherwise hurt or frightened you?: no Over the past 2 weeks, have you felt down, depressed or hopeless?: no Over the past 2 weeks, have you felt little interest or pleasure in doing things?:no   Gynecologic History No LMP recorded. (Menstrual status: Irregular Periods). Contraception: tubal ligation Last Pap: 2019. Results were: normal Last mammogram: 2023. Results were: normal  Obstetric History OB History  Gravida Para Term Preterm AB Living  3 2 2  0 1 2  SAB IAB Ectopic Multiple Live Births  0 1 0 0 2    # Outcome Date GA Lbr Len/2nd Weight Sex Delivery Anes PTL Lv  3 Term      CS-Unspec     2 Term      Vag-Spont     1 IAB             Past Medical History:  Diagnosis Date   Abnormal Pap smear of cervix    Anxiety    Depression    Vitamin D deficiency 04/2019    Past Surgical History:  Procedure Laterality Date   CERVICAL BIOPSY  W/ LOOP ELECTRODE EXCISION  03-2013   CESAREAN SECTION     x 1   COLPOSCOPY     TUBAL LIGATION       Current Outpatient Medications:    fluconazole (DIFLUCAN) 200 MG tablet, Take 1 tablet (200 mg total) by mouth every 3 (three) days., Disp: 3 tablet, Rfl: 2   buPROPion  (WELLBUTRIN XL) 150 MG 24 hr tablet, TAKE 1 TABLET BY MOUTH EVERY DAY IN THE MORNING, Disp: 90 tablet, Rfl: 2   diclofenac (VOLTAREN) 75 MG EC tablet, Take 75 mg by mouth 2 (two) times daily., Disp: , Rfl:    gabapentin (NEURONTIN) 400 MG capsule, Take 1 capsule (400 mg total) by mouth 4 (four) times daily., Disp: 90 capsule, Rfl: 3   HYDROcodone-acetaminophen (NORCO) 10-325 MG tablet, Take 1 tablet by mouth every 6 (six) hours as needed., Disp: , Rfl:    lamoTRIgine (LAMICTAL) 150 MG tablet, Take 1 tablet (150 mg total) by mouth daily. For mood stabilization, Disp: 30 tablet, Rfl: 3   traZODone (DESYREL) 100 MG tablet, TAKE 1 TABLET BY MOUTH EVERYDAY AT BEDTIME, Disp: 90 tablet, Rfl: 2   Vitamin D, Ergocalciferol, (DRISDOL) 1.25 MG (50000 UNIT) CAPS capsule, Take 50,000 Units by mouth every 7 (seven) days., Disp: , Rfl:  No Known Allergies  Social History   Tobacco Use   Smoking status: Never   Smokeless tobacco: Never  Substance Use Topics   Alcohol use: No    Comment: socially     Family History  Problem Relation Age of Onset   Hypertension Mother    Diabetes Mother    Hyperlipidemia Brother  Pancreatic cancer Maternal Grandmother    Diabetes Maternal Grandmother       Review of Systems  Constitutional: negative for fatigue and weight loss Respiratory: negative for cough and wheezing Cardiovascular: negative for chest pain, fatigue and palpitations Gastrointestinal: negative for abdominal pain and change in bowel habits Musculoskeletal:negative for myalgias Neurological: negative for gait problems and tremors Behavioral/Psych: negative for abusive relationship, depression Endocrine: negative for temperature intolerance    Genitourinary:positive for vaginal discharge with burning.  negative for abnormal menstrual periods, genital lesions, hot flashes, sexual problems  Integument/breast: negative for breast lump, breast tenderness, nipple discharge and skin lesion(s)     Objective:       BP 110/68   Pulse 87   Ht 5\' 2"  (1.575 m)   Wt 263 lb (119.3 kg)   BMI 48.10 kg/m  General:   Alert and no distress  Skin:   no rash or abnormalities  Lungs:   clear to auscultation bilaterally  Heart:   regular rate and rhythm, S1, S2 normal, no murmur, click, rub or gallop  Breasts:   normal without suspicious masses, skin or nipple changes or axillary nodes  Abdomen:  normal findings: no organomegaly, soft, non-tender and no hernia  Pelvis:  External genitalia: normal general appearance Urinary system: urethral meatus normal and bladder without fullness, nontender Vaginal: normal without tenderness, induration or masses Cervix: normal appearance Adnexa: normal bimanual exam Uterus: anteverted and non-tender, normal size   Lab Review Urine pregnancy test Labs reviewed yes Radiologic studies reviewed yes  I have spent a total of 20 minutes of face-to-face time, excluding clinical staff time, reviewing notes and preparing to see patient, ordering tests and/or medications, and counseling the patient.   Assessment:    1. Encounter for gynecological examination with Papanicolaou smear of cervix Rx: - Cytology - PAP( [name])  2. Vaginal discharge Rx: - Cervicovaginal ancillary only( [name]) - fluconazole (DIFLUCAN) 200 MG tablet; Take 1 tablet (200 mg total) by mouth every 3 (three) days.  Dispense: 3 tablet; Refill: 2  3. Screening examination for STD (sexually transmitted disease) Rx: - HIV Antibody (routine testing w rflx) - RPR - Hepatitis B Surface AntiGEN - Hepatitis C antibody  4. Class 3 severe obesity without serious comorbidity with body mass index (BMI) of 45.0 to 49.9 in adult, unspecified obesity type ([name]) - weight reduction recommended     Plan:    Education reviewed: calcium supplements, depression evaluation, low fat, low cholesterol diet, safe sex/STD prevention, self breast exams, and weight bearing exercise. Follow up  in: 1 year.   Meds ordered this encounter  Medications   fluconazole (DIFLUCAN) 200 MG tablet    Sig: Take 1 tablet (200 mg total) by mouth every 3 (three) days.    Dispense:  3 tablet    Refill:  2   Orders Placed This Encounter  Procedures   HIV Antibody (routine testing w rflx)   RPR   Hepatitis B Surface AntiGEN   Hepatitis C antibody    , MD 02/01/2022 2:21 PM

## 2022-02-01 NOTE — Progress Notes (Signed)
RX Diflucan resent to pharmacy. Original e transaction failed.

## 2022-02-02 LAB — CERVICOVAGINAL ANCILLARY ONLY
Bacterial Vaginitis (gardnerella): NEGATIVE
Candida Glabrata: NEGATIVE
Candida Vaginitis: POSITIVE — AB
Chlamydia: NEGATIVE
Comment: NEGATIVE
Comment: NEGATIVE
Comment: NEGATIVE
Comment: NEGATIVE
Comment: NEGATIVE
Comment: NORMAL
Neisseria Gonorrhea: NEGATIVE
Trichomonas: NEGATIVE

## 2022-02-02 LAB — HEPATITIS C ANTIBODY: Hep C Virus Ab: NONREACTIVE

## 2022-02-02 LAB — HIV ANTIBODY (ROUTINE TESTING W REFLEX): HIV Screen 4th Generation wRfx: NONREACTIVE

## 2022-02-02 LAB — HEPATITIS B SURFACE ANTIGEN: Hepatitis B Surface Ag: NEGATIVE

## 2022-02-02 LAB — RPR: RPR Ser Ql: NONREACTIVE

## 2022-02-03 ENCOUNTER — Telehealth: Payer: Self-pay | Admitting: Emergency Medicine

## 2022-02-03 ENCOUNTER — Other Ambulatory Visit: Payer: Self-pay | Admitting: Obstetrics

## 2022-02-03 LAB — CYTOLOGY - PAP
Comment: NEGATIVE
Diagnosis: NEGATIVE
High risk HPV: NEGATIVE

## 2022-02-03 NOTE — Telephone Encounter (Signed)
TC to patient to discuss results and Rx. 

## 2022-02-09 ENCOUNTER — Telehealth (HOSPITAL_COMMUNITY): Payer: Self-pay | Admitting: Psychiatry

## 2022-02-09 NOTE — Telephone Encounter (Signed)
Patient had disability forms that she needed filled out.  Provider was agreeable to this and filled forms out.  Provider informed patient that they were completed and she could pick them up at her convenience.  She endorsed understanding and agreed.

## 2022-02-22 ENCOUNTER — Ambulatory Visit (INDEPENDENT_AMBULATORY_CARE_PROVIDER_SITE_OTHER): Payer: No Payment, Other | Admitting: Clinical

## 2022-02-22 DIAGNOSIS — F3181 Bipolar II disorder: Secondary | ICD-10-CM | POA: Diagnosis not present

## 2022-02-22 NOTE — Progress Notes (Signed)
   THERAPIST PROGRESS NOTE Virtual Visit via Video Note  I connected with Jade [name] on 02/22/22 at 10:00 AM EST by a video enabled telemedicine application and verified that I am speaking with the correct person using two identifiers.  Location: Patient: home Provider: office   I discussed the limitations of evaluation and management by telemedicine and the availability of in person appointments. The patient expressed understanding and agreed to proceed.  Follow Up Instructions: I discussed the assessment and treatment plan with the patient. The patient was provided an opportunity to ask questions and all were answered. The patient agreed with the plan and demonstrated an understanding of the instructions.   The patient was advised to call back or seek an in-person evaluation if the symptoms worsen or if the condition fails to improve as anticipated.   Session Time: 20 minutes  Participation Level: Active  Behavioral Response: CasualAlertDepressed  Type of Therapy: Individual Therapy  Treatment Goals addressed: client will participate in 80% of the scheduled individual psychotherapy appointments  ProgressTowards Goals: Progressing  Interventions: CBT and Supportive  Summary:  Jade [name] is a 46 y.o. female who presents for the scheduled appointment oriented x 5, appropriately dressed, and friendly.  Client denied hallucinations and delusions. Client reported on today she has been managing fairly well.  Client reported she had a small Thanksgiving meal at home did not go anywhere.  Client reported ongoing symptoms of depression and anxiety.  Client reported she is compliant with her psychiatric medications which helped to manage but she also relies on her religious believes to help keep her grounded during the process.  Client reported she has been in limbo about how to figure out a way to provide for her her child and herself and also needing to find separate housing from  living with her mother.  Client reported her oldest son decided to find his own place to stay better figuring out a way to get an apartment with her and figure out how to cover her rent.  Evidence of progress towards goal:   client is medication compliant 7 days per week.  Suicidal/Homicidal: Nowithout intent/plan  Therapist Response:  Therapist began the appointment asking the client how she has been doing since last seen. Therapist used CBT to engage using active listening and positive emotional support. Therapist used CBT to engage asking the client to describe psychosocial stressors that are impacting her. Therapist used CBT to reinforce the use of problem solving skills and positive coping skills. Therapist used CBT ask the client to identify her progress with frequency of use with coping skills with continued practice in her daily activity.    Therapist assigned the client homework for self care.   Plan: Return again in 3 weeks.  Diagnosis: Bipolar 2 disorder, major depressive episode  Collaboration of Care: Patient refused AEB none requested by the client.  Patient/Guardian was advised Release of Information must be obtained prior to any record release in order to collaborate their care with an outside provider. Patient/Guardian was advised if they have not already done so to contact the registration department to sign all necessary forms in order for [place] to release information regarding their care.   Consent: Patient/Guardian gives verbal consent for treatment and assignment of benefits for services provided during this visit. Patient/Guardian expressed understanding and agreed to proceed.   Neena Rhymes Arshdeep Bolger, LCSW 02/22/2022

## 2022-02-28 NOTE — Plan of Care (Signed)
  Problem: Depression CCP Problem  1  Goal: LTG: Satrina WILL SCORE LESS THAN 10 ON THE PATIENT HEALTH QUESTIONNAIRE (PHQ-9) Outcome: Progressing Goal: STG: Fidencia WILL PARTICIPATE IN AT LEAST 80% OF SCHEDULED INDIVIDUAL PSYCHOTHERAPY SESSIONS Outcome: Progressing Goal: STG: Maureen WILL COMPLETE AT LEAST 80% OF ASSIGNED HOMEWORK Outcome: Progressing

## 2022-03-15 ENCOUNTER — Ambulatory Visit (INDEPENDENT_AMBULATORY_CARE_PROVIDER_SITE_OTHER): Payer: No Payment, Other | Admitting: Clinical

## 2022-03-15 DIAGNOSIS — F3181 Bipolar II disorder: Secondary | ICD-10-CM | POA: Diagnosis not present

## 2022-03-16 NOTE — Progress Notes (Signed)
THERAPIST PROGRESS NOTE Virtual Visit via Video Note  I connected with Jade [name] on 03/15/2022 at 10:00 AM EST by a video enabled telemedicine application and verified that I am speaking with the correct person using two identifiers.  Location: Patient: home Provider: office   I discussed the limitations of evaluation and management by telemedicine and the availability of in person appointments. The patient expressed understanding and agreed to proceed.   Follow Up Instructions: I discussed the assessment and treatment plan with the patient. The patient was provided an opportunity to ask questions and all were answered. The patient agreed with the plan and demonstrated an understanding of the instructions.   The patient was advised to call back or seek an in-person evaluation if the symptoms worsen or if the condition fails to improve as anticipated.   Session Time: 25 minutes  Participation Level: Active  Behavioral Response: CasualAlertDepressed  Type of Therapy: Individual Therapy  Treatment Goals addressed: client will complete 80% of assigned homework  ProgressTowards Goals: Progressing  Interventions: CBT and Supportive  Summary:  Jade [name] is a 46 y.o. female who presents for the scheduled appointment oriented times five, appropriately dressed, and friendly. Client denied hallucinations and delusions. Client reported on today se has been trying to manage. Client reported she has been trying to keep the holidays positive for her son. Client reported her son went for MRI due to previous unexplained seizure and everything looked clear from what doctors saw. Client reported it is a relief for her. Client reported she has gone to her PCP for regular follow up and has found that the pain in her legs in worse. Client reported her PCP said it is probably coming from her back. Client reported living with her mother is not getting better. Client reported her mother asked  her when she and her son will be moving out. Client reported she did not respond because she did not want to say something that was too mean. Client reported when the time comes that she is able to move out she will probably not keep a relationship with her mother because of how things have been between them by history. Client reported she has been compliant with medications but she is still unable to sleep. Client reported the increased Trazadone has not done anything as she can still stay up for 24 hours or longer.  Evidence of progress towards goal:  client reported medication compliance 7 days per week. Client reported reframing negative thoughts at least 3x per week.  Suicidal/Homicidal: Nowithout intent/plan  Therapist Response:  Therapist began the appointment asking the client how she has been doing since last seen. Therapist used CBT to engage using active listening and positive emotional support. Therapist used CBT to engage and ask the client about her symptoms of depression as it relates to family and other psychosocial stressors. Therapist used CBT to discuss positive coping skills. Therapist used CBT ask the client to identify her progress with frequency of use with coping skills with continued practice in her daily activity.    Therapist assigned the client homework to practice self care.    Plan: Return again in 3 weeks.  Diagnosis: bipolar 2 disorder, major depressive episode  Collaboration of Care: Patient refused AEB none requested by the client.  Patient/Guardian was advised Release of Information must be obtained prior to any record release in order to collaborate their care with an outside provider. Patient/Guardian was advised if they have not already done  so to contact the registration department to sign all necessary forms in order for [place] to release information regarding their care.   Consent: Patient/Guardian gives verbal consent for treatment and assignment of  benefits for services provided during this visit. Patient/Guardian expressed understanding and agreed to proceed.   Neena Rhymes Jade Nordquist, LCSW 03/15/2022

## 2022-04-01 ENCOUNTER — Telehealth (INDEPENDENT_AMBULATORY_CARE_PROVIDER_SITE_OTHER): Payer: No Payment, Other | Admitting: Psychiatry

## 2022-04-01 DIAGNOSIS — F3181 Bipolar II disorder: Secondary | ICD-10-CM | POA: Diagnosis not present

## 2022-04-01 DIAGNOSIS — F419 Anxiety disorder, unspecified: Secondary | ICD-10-CM | POA: Diagnosis not present

## 2022-04-01 NOTE — Progress Notes (Signed)
BH MD/PA/NP OP Progress Note Virtual Visit via Video Note  I connected with Jade [name] on 04/01/22 at  1:30 PM EST by a video enabled telemedicine application and verified that I am speaking with the correct person using two identifiers.  Location: Patient: Home Provider: Clinic   I discussed the limitations of evaluation and management by telemedicine and the availability of in person appointments. The patient expressed understanding and agreed to proceed.   I discussed the assessment and treatment plan with the patient. The patient was provided an opportunity to ask questions and all were answered. The patient agreed with the plan and demonstrated an understanding of the instructions.   The patient was advised to call back or seek an in-person evaluation if the symptoms worsen or if the condition fails to improve as anticipated.  I provided 20 minutes of non-face-to-face time during this encounter.   Armando Reichert, MD   04/01/2022 1:50 PM Jade [name]  MRN:  [id number]  Chief Complaint: Follow up  HPI: 47 year old female seen today for follow up psychiatry evaluation.  She has a psychiatric history of PTSD, Depression, Bipolar 2, Anxiety, and SI. She currently managed on Lamictal 150 mg daily, gabapentin 400 mg three times daily, Wellbutrin XL 150 daily, and Trazodone 100 mg nightly as needed. She notes her medications are somewhat effective in managing her psychiatric conditions.    Pt reports that her mood is "depressed". She reports that a lot has been going on and she is back and forth with the government for her disability.  It has not approved yet and she is feeling overwhelmed about the process.  She is afraid that if it is not approved, it would be hard for her to work because of her scoliosis and chronic pain due to sciatica.  She sometimes have difficulty in sleeping but other times sleeps well with trazodone. She reports good appetite.  Currently, she denies any suicidal  ideations, homicidal ideations, auditory and visual hallucinations.  She denies paranoia.  She denies any medication side effects and has been tolerating it well.  She reports that she has not been taking Neurontin regularly and only takes it twice in a week.  Discussed to start taking it regularly at night.  Discussed that if she had not been taking it regularly then she should not start 4 times daily and only take at night.  She verbalizes understanding.  She also reports that her periods are Irregular and she feels like she is going in menopause.  Discussed that she should talk with her gynecologist about it.  She verbalizes understanding   She is currently unemployed and applying for disability. She is currently living with her mom.  She is planning to move out soon. She denies drinking alcohol and using any illicit drugs.  She does not smoke cigarettes.  Patient denies any need for change in medication and medication dosages and wants to continue same meds.  She denies any other concerns. Patient is alert and oriented x 4, anxious, cooperative, and fully engaged in conversation during the encounter.  Her thought process is linear with coherent speech . She does not appear to be responding to internal/external stimuli .   Marland Kitchen  Visit Diagnosis:    ICD-10-CM   1. Anxiety  F41.9     2. Bipolar 2 disorder, major depressive episode ([place])  F31.81       Past Psychiatric History: PTSD, Depression, Bipolar 2, Anxiety, and SI  Past  Medical History:  Past Medical History:  Diagnosis Date   Abnormal Pap smear of cervix    Anxiety    Depression    Vitamin D deficiency 04/2019    Past Surgical History:  Procedure Laterality Date   CERVICAL BIOPSY  W/ LOOP ELECTRODE EXCISION  03-2013   CESAREAN SECTION     x 1   COLPOSCOPY     TUBAL LIGATION      Family Psychiatric History:  Mom- depression, anxiety  Family History:  Family History  Problem Relation Age of Onset   Hypertension Mother     Diabetes Mother    Hyperlipidemia Brother    Pancreatic cancer Maternal Grandmother    Diabetes Maternal Grandmother     Social History:  Social History   Socioeconomic History   Marital status: Single    Spouse name: Not on file   Number of children: 2   Years of education: 14   Highest education level: Some college, no degree  Occupational History   Not on file  Tobacco Use   Smoking status: Never   Smokeless tobacco: Never  Vaping Use   Vaping Use: Never used  Substance and Sexual Activity   Alcohol use: No    Comment: socially    Drug use: No   Sexual activity: Not Currently    Birth control/protection: Surgical  Other Topics Concern   Not on file  Social History Narrative   Marital status: single; dating seriously x 7 years.      Children:  2 children (14, 6)      Lives: with two sons      Employment:  Conservation officer, nature at [employer]; first shift for 8 years.      Tobacco: none      Alcohol:  4-6 drinks per month      Drugs: none      Exercise:  Sporadic; twice weekly; walking      Seatbelt: 100%      Guns: none      Sexual activity:  Total partners = 6; history of Chlamydia in 2002; last STD screening 2016; males only   Social Determinants of Health   Financial Resource Strain: Not on file  Food Insecurity: Not on file  Transportation Needs: Not on file  Physical Activity: Not on file  Stress: Not on file  Social Connections: Not on file    Allergies: No Known Allergies  Metabolic Disorder Labs: Lab Results  Component Value Date   HGBA1C 6.3 (H) 08/31/2020   MPG 134 08/31/2020   MPG 120 (H) 11/17/2014   No results found for: "PROLACTIN" Lab Results  Component Value Date   CHOL 195 08/31/2020   TRIG 92 08/31/2020   HDL 49 08/31/2020   CHOLHDL 4.0 08/31/2020   VLDL 18 08/31/2020   LDLCALC 128 (H) 08/31/2020   LDLCALC 113 (H) 05/24/2019   Lab Results  Component Value Date   TSH 1.315 08/31/2020   TSH 0.572 05/24/2019    Therapeutic Level Labs: No  results found for: "LITHIUM" No results found for: "VALPROATE" No results found for: "CBMZ"  Current Medications: Current Outpatient Medications  Medication Sig Dispense Refill   buPROPion (WELLBUTRIN XL) 150 MG 24 hr tablet TAKE 1 TABLET BY MOUTH EVERY DAY IN THE MORNING 90 tablet 2   diclofenac (VOLTAREN) 75 MG EC tablet Take 75 mg by mouth 2 (two) times daily.     fluconazole (DIFLUCAN) 200 MG tablet Take 1 tablet (200 mg total) by mouth every  3 (three) days. 3 tablet 2   gabapentin (NEURONTIN) 400 MG capsule Take 1 capsule (400 mg total) by mouth 4 (four) times daily. 90 capsule 3   HYDROcodone-acetaminophen (NORCO) 10-325 MG tablet Take 1 tablet by mouth every 6 (six) hours as needed.     lamoTRIgine (LAMICTAL) 150 MG tablet Take 1 tablet (150 mg total) by mouth daily. For mood stabilization 30 tablet 3   traZODone (DESYREL) 100 MG tablet TAKE 1 TABLET BY MOUTH EVERYDAY AT BEDTIME 90 tablet 2   Vitamin D, Ergocalciferol, (DRISDOL) 1.25 MG (50000 UNIT) CAPS capsule Take 50,000 Units by mouth every 7 (seven) days.     No current facility-administered medications for this visit.     Musculoskeletal: Strength & Muscle Tone: Unable to assess due to video visit Gait & Station:  Unable to assess due to video visit Patient leans: N/A  Psychiatric Specialty Exam: Review of Systems  There were no vitals taken for this visit.There is no height or weight on file to calculate BMI.  General Appearance:  casual  Eye Contact: Fair  Speech:  Clear and Coherent and Normal Rate  Volume:  Normal  Mood:  Anxious and Depressed  Affect:  Constricted  Thought Process:  Coherent and Goal Directed  Orientation:  Full (Time, Place, and Person)  Thought Content: WDL and Logical   Suicidal Thoughts:  No  Homicidal Thoughts:  No  Memory:  Immediate;   Good Recent;   Good Remote;   Good  Judgement:  Good  Insight:  Good  Psychomotor Activity: Normal  Concentration:  Concentration: Good and  Attention Span: Good  Recall:  Good  Fund of Knowledge: Good  Language: Good  Akathisia:  No  Handed:  Right  AIMS (if indicated): not done  Assets:  Communication Skills Desire for Improvement Financial Resources/Insurance Housing Leisure Time Social Support  ADL's:  Intact  Cognition: WNL  Sleep:  Fair   Screenings: GAD-7    Flowsheet Row Video Visit from 12/30/2021 in [facility] Video Visit from 09/27/2021 in [facility] Counselor from 06/28/2021 in [facility] Video Visit from 12/30/2020 in [facility] Video Visit from 10/02/2020 in [facility]  Total GAD-7 Score 19 16 3 4 4       PHQ2-9    Flowsheet Row Video Visit from 12/30/2021 in [facility] Video Visit from 09/27/2021 in [facility] Counselor from 06/28/2021 in [facility] Video Visit from 12/30/2020 in [facility] Video Visit from 10/02/2020 in [facility]  PHQ-2 Total Score 6 6 1  0 1  PHQ-9 Total Score 25 20 8 4 5       Flowsheet Row Video Visit from 12/30/2021 in [facility] Video Visit from 10/02/2020 in [facility] Video Visit from 09/07/2020 in [facility]  C-SSRS RISK CATEGORY Error: Q7 should not be populated when Q6 is No Error: Question 6 not populated Error: Q3, 4, or 5 should not be populated when Q2 is No        Assessment and Plan: Patient is reporting increase stress due to disability application and reporting poor sleep.  She is also complaining of chronic pain due to sciatica and currently applying for disability.  She has not been taking gabapentin regularly.  Discussed to start taking gabapentin regularly at night  only.  Discussed that she should  not start gabapentin 4 times daily as she has not been taking it.  She will continue her other medications as prescribed.  No other concerns at this time.  Bipolar 2 disorder, major depressive episode ([name]) Anxiety Continue- buPROPion (WELLBUTRIN XL) 150 MG 24 hr tablet; Take 1 tablet (150 mg total) by mouth every morning.   Start taking gabapentin (NEURONTIN) 400 MG capsule ; Take 1 capsule (400 mg total) by mouth nightly for anxiety, insomnia and pain Continue lamoTRIgine (LAMICTAL) 150 MG tablet; Take 1 tablet (150 mg total) by mouth daily. For mood stabilization   Continue- traZODone (DESYREL) 100 MG tablet; Take 1 tablet (100 mg total) by mouth at bedtime.    Follow up in 3 months Follow up with therapy  Armando Reichert, MD 04/01/2022, 1:50 PM

## 2022-04-05 ENCOUNTER — Encounter (HOSPITAL_COMMUNITY): Payer: Self-pay | Admitting: Psychiatry

## 2022-04-07 ENCOUNTER — Encounter (HOSPITAL_COMMUNITY): Payer: Self-pay

## 2022-04-07 ENCOUNTER — Ambulatory Visit (HOSPITAL_COMMUNITY): Payer: No Payment, Other | Admitting: Clinical

## 2022-04-21 ENCOUNTER — Ambulatory Visit (INDEPENDENT_AMBULATORY_CARE_PROVIDER_SITE_OTHER): Payer: Medicaid Other | Admitting: Clinical

## 2022-04-21 DIAGNOSIS — F3181 Bipolar II disorder: Secondary | ICD-10-CM

## 2022-04-21 NOTE — Progress Notes (Signed)
   THERAPIST PROGRESS NOTE Virtual Visit via Video Note  I connected with Jade [name] on 04/21/22 at 11:00 AM EST by a video enabled telemedicine application and verified that I am speaking with the correct person using two identifiers.  Location: Patient: home Provider: office   I discussed the limitations of evaluation and management by telemedicine and the availability of in person appointments. The patient expressed understanding and agreed to proceed.   Follow Up Instructions: I discussed the assessment and treatment plan with the patient. The patient was provided an opportunity to ask questions and all were answered. The patient agreed with the plan and demonstrated an understanding of the instructions.   The patient was advised to call back or seek an in-person evaluation if the symptoms worsen or if the condition fails to improve as anticipated.    Session Time: 25 minutes  Participation Level: Active  Behavioral Response: CasualAlertDepressed  Type of Therapy: Individual Therapy  Treatment Goals addressed: client will complete 80% of assigned homework  ProgressTowards Goals: Progressing  Interventions: CBT and Supportive  Summary:  Jade [name] is a 47 y.o. female who presents for the scheduled appointment oriented times five, appropriately dressed, and friendly. Client denied hallucinations and delusions. Client reported she is doing okay. Client she is sick and trying to recover. Client reported she is going to go to her son school today to sit in his class. Client reported he was involved in a incident with bullying another student. Client reported otherwise she has applied for two apartments. Client reported she is is trying to move out quicker sooner than later because of her. Client reported her mother went behind her back and asked the leasing office if she applied for an apartment and how soon is she close to being selected for a place in her complex. Client  reported she felt like her privacy was violated and weird her mother did that. Client reported she is waiting to hear back from the places she applied. Client reported she wants to move out and have peace. Evidence of progress towards goal:  client reported 1 step to accomplishing her goal or having her own place.   Suicidal/Homicidal: Nowithout intent/plan  Therapist Response:  Therapist began the appointment asking the client how she has been doing since last seen. Therapist used CBT to engage using active listening and positive emotional support. Therapist used CBT to engage and ask the client about stressors and how they are improving. Therapist used CBT to discuss coping skills and positive acknowledgement of her progress. Therapist used CBT ask the client to identify her progress with frequency of use with coping skills with continued practice in her daily activity.    Therapist assigned the client homework to practice self care.   Plan: Return again in 3 weeks.  Diagnosis: bipolar 2 disorder, major depressive episode  Collaboration of Care: Patient refused AEB none requested by the client.  Patient/Guardian was advised Release of Information must be obtained prior to any record release in order to collaborate their care with an outside provider. Patient/Guardian was advised if they have not already done so to contact the registration department to sign all necessary forms in order for [place] to release information regarding their care.   Consent: Patient/Guardian gives verbal consent for treatment and assignment of benefits for services provided during this visit. Patient/Guardian expressed understanding and agreed to proceed.   [place], LCSW 04/21/2022

## 2022-05-16 ENCOUNTER — Other Ambulatory Visit (HOSPITAL_COMMUNITY): Payer: Self-pay | Admitting: Psychiatry

## 2022-05-16 DIAGNOSIS — F3181 Bipolar II disorder: Secondary | ICD-10-CM

## 2022-05-19 ENCOUNTER — Telehealth (HOSPITAL_COMMUNITY): Payer: Self-pay | Admitting: *Deleted

## 2022-05-19 ENCOUNTER — Ambulatory Visit: Payer: Self-pay

## 2022-05-19 NOTE — Telephone Encounter (Signed)
[facility] requested a new rx for this patients Escitalopram 20 mg. Chart indicates she has not had a rx since oct and fax says not filled since 08/22/21. She does not have a future appt scheduled, she last saw Dr Rosita Kea on 04/01/2022.

## 2022-05-23 ENCOUNTER — Ambulatory Visit (INDEPENDENT_AMBULATORY_CARE_PROVIDER_SITE_OTHER): Payer: Medicaid Other | Admitting: Clinical

## 2022-05-23 DIAGNOSIS — F3181 Bipolar II disorder: Secondary | ICD-10-CM

## 2022-05-23 NOTE — Progress Notes (Unsigned)
THERAPIST PROGRESS NOTE Virtual Visit via Video Note  I connected with Jade [name] on 05/23/2022 at  8:00 AM EST by a video enabled telemedicine application and verified that I am speaking with the correct person using two identifiers.  Location: Patient: home Provider: office   I discussed the limitations of evaluation and management by telemedicine and the availability of in person appointments. The patient expressed understanding and agreed to proceed.   Follow Up Instructions: I discussed the assessment and treatment plan with the patient. The patient was provided an opportunity to ask questions and all were answered. The patient agreed with the plan and demonstrated an understanding of the instructions.   The patient was advised to call back or seek an in-person evaluation if the symptoms worsen or if the condition fails to improve as anticipated.    Session Time: 45 minutes  Participation Level: Active  Behavioral Response: CasualAlertDepressed  Type of Therapy: Individual Therapy  Treatment Goals addressed: client will complete at least 80% of assigned homework   ProgressTowards Goals: Progressing  Interventions: CBT and Supportive  Summary:  Jade [name] is a 47 y.o. female who presents for the scheduled appointment oriented times five, appropriately dressed, and friendly. Client denied hallucinations and delusions. Client reported she is feeling depressed and not in a good space. Client reported she is not happy and has been trying to push through the feeling. Client reported since her grandmother passed she was the only one who truly loved her. Client reported it is a hard reality to think of and does not know how to cope. Client reported she has previously changed her number, blocked people from her phone and stopped talking to people but they all keep trying to find ways to communicate with her. Client reported those previous friends used her and even admitted  to not treating her right but keep trying to manipulate her into being their friend. Client reported the same occurs with men that she has met. Client reported men have not been any better with how poorly she has been treated. Client reported she feels like she takes a hit emotionally because of it. Client reported it upsets her that people see value in her but do not treat her with the respect she deserves.  Evidence of progress towards goal:  client reported 1 negative belief/ thought pattern about herself that contributes to depression.   Suicidal/Homicidal: Nowithout intent/plan  Therapist Response:  Therapist began the appointment asking the client how she has been doing. Therapist used CBT to engage using active listening and positive emotional support. Therapist used CBT to engage and ask the client to identify the source of her depressive thoughts and emotions. Therapist used CBT to engage and normalize the clients emotional response. Therapist used CBT to engage with the client to brainstorm how she can reframe negative thoughts about herself. Therapist used CBT ask the client to identify her progress with frequency of use with coping skills with continued practice in her daily activity.    Therapist assigned the client homework to practice self care and being consistent with boundaries.   Plan: Return again in 3 weeks.  Diagnosis: bipolar 2 disorder, major depressive episode  Collaboration of Care: Patient refused AEB none requested by the client.  Patient/Guardian was advised Release of Information must be obtained prior to any record release in order to collaborate their care with an outside provider. Patient/Guardian was advised if they have not already done so to contact the registration  department to sign all necessary forms in order for [place] to release information regarding their care.   Consent: Patient/Guardian gives verbal consent for treatment and assignment of benefits for  services provided during this visit. Patient/Guardian expressed understanding and agreed to proceed.   [place], LCSW 05/23/2022

## 2022-06-09 ENCOUNTER — Ambulatory Visit (INDEPENDENT_AMBULATORY_CARE_PROVIDER_SITE_OTHER): Payer: Medicaid Other | Admitting: Clinical

## 2022-06-09 DIAGNOSIS — F3181 Bipolar II disorder: Secondary | ICD-10-CM

## 2022-06-09 NOTE — Progress Notes (Signed)
THERAPIST PROGRESS NOTE Virtual Visit via Video Note  I connected with Jade [name] on 06/09/22 at  8:00 AM EDT by a video enabled telemedicine application and verified that I am speaking with the correct person using two identifiers.  Location: Patient: home Provider: office   I discussed the limitations of evaluation and management by telemedicine and the availability of in person appointments. The patient expressed understanding and agreed to proceed.   Follow Up Instructions: I discussed the assessment and treatment plan with the patient. The patient was provided an opportunity to ask questions and all were answered. The patient agreed with the plan and demonstrated an understanding of the instructions.   The patient was advised to call back or seek an in-person evaluation if the symptoms worsen or if the condition fails to improve as anticipated.    Session Time: 20 minutes  Participation Level: Active  Behavioral Response: CasualAlertEuthymic  Type of Therapy: Individual Therapy  Treatment Goals addressed: client will participate in 80% of scheduled individual psychotherapy sessions  ProgressTowards Goals: Progressing  Interventions: CBT and Supportive  Summary:  Jade [name] is a 47 y.o. female who presents for the scheduled appointment oriented times five, appropriately dressed, and friendly. Client reported on today she is doing well. Client reported she forgot to discuss that she moved into her own apartment a couple of weeks ago. Client reported she moved to a complex separate from her mother. Client reported she is feeling at peace being away from her mother. Client reported in that aspect she did not realize how much her mother was adding to her depression. Client reported after seeing her to son to school she would go back to bed. Client reported since being in her own space she has not taken a nap during the day. Client reported she has improved motivation to  get things done although she is still limited due to her physical ailments. Client reported otherwise she is taking medications every few hours for her back and other pains. Client reported has another medical appointment she will need to leave for within the hour. Client reported she will spend some time with her eldest son today as it is his birthday. Evidence of progress towards goal:  client reported 1 improvement of not taking a nap during the day 7 out of 7 days per week for the past 2 weeks.   Suicidal/Homicidal: Nowithout intent/plan  Therapist Response:  Therapist began the appointment asking the client how she has been doing since last seen. Therapist used CBT to engage using active listening and positive emotional support. Therapist used CBT to engage Client about the current severity of her depressive symptoms and contributing factors. Therapist used CBT to positively reinforce the clients reframing of thoughts and problem-solving solutions. Therapist used CBT ask the client to identify her progress with frequency of use with coping skills with continued practice in her daily activity.    Therapist assigned client homework to practice self-care.    Plan: Return again in 3 weeks.  Diagnosis: bipolar 2 disorder, major depressive episode  Collaboration of Care: Patient refused AEB none requested by the client.  Patient/Guardian was advised Release of Information must be obtained prior to any record release in order to collaborate their care with an outside provider. Patient/Guardian was advised if they have not already done so to contact the registration department to sign all necessary forms in order for [place] to release information regarding their care.   Consent: Patient/Guardian gives verbal  consent for treatment and assignment of benefits for services provided during this visit. Patient/Guardian expressed understanding and agreed to proceed.   [place], LCSW 06/09/2022

## 2022-07-07 ENCOUNTER — Telehealth (HOSPITAL_COMMUNITY): Payer: Self-pay | Admitting: Psychiatry

## 2022-07-07 NOTE — Telephone Encounter (Signed)
Received paperwork from patient for disability. I saw patient only once virtually in January and during that visit I never recommended her to take time off for mental health reason. At that time, she reported to me that she was applying for disability due to sciatica but having difficulty getting it.  Called patient today - She reports that she applied for disability for sciatica and mental health reasons. I told patient that I don't recommend disability for mental health reasons but if she is unable to work due to sciatica then she can talk to her medical provider for disability.  She states that [place] was signing her disability forms previously. She will follow-up with Sherril Cong NP to get disability. Front desk staff  was informed. They will call patient to schedule F/u with Karen Kitchens NP.   Karsten Ro, MD PGY3 Psychiatry Resident  [facility]

## 2022-08-03 ENCOUNTER — Telehealth (HOSPITAL_COMMUNITY): Payer: Self-pay | Admitting: *Deleted

## 2022-08-03 NOTE — Telephone Encounter (Signed)
Patient called asking for return call from her provider due to paperwork that needs to be filed.

## 2022-08-04 ENCOUNTER — Encounter (HOSPITAL_COMMUNITY): Payer: Self-pay | Admitting: Psychiatry

## 2022-08-04 ENCOUNTER — Telehealth (INDEPENDENT_AMBULATORY_CARE_PROVIDER_SITE_OTHER): Payer: Medicaid Other | Admitting: Psychiatry

## 2022-08-04 DIAGNOSIS — F419 Anxiety disorder, unspecified: Secondary | ICD-10-CM

## 2022-08-04 DIAGNOSIS — F3181 Bipolar II disorder: Secondary | ICD-10-CM

## 2022-08-04 MED ORDER — LAMOTRIGINE 150 MG PO TABS: 150.0000 mg | ORAL_TABLET | Freq: Every day | ORAL | 3 refills | Status: DC

## 2022-08-04 MED ORDER — BUPROPION HCL ER (XL) 150 MG PO TB24: ORAL_TABLET | ORAL | 2 refills | Status: DC

## 2022-08-04 MED ORDER — GABAPENTIN 300 MG PO CAPS
300.0000 mg | ORAL_CAPSULE | Freq: Three times a day (TID) | ORAL | 3 refills | Status: DC
Start: 2022-08-04 — End: 2022-10-13

## 2022-08-04 MED ORDER — ARIPIPRAZOLE 5 MG PO TABS
5.0000 mg | ORAL_TABLET | Freq: Every day | ORAL | 3 refills | Status: DC
Start: 2022-08-04 — End: 2022-08-30

## 2022-08-04 MED ORDER — TRAZODONE HCL 100 MG PO TABS: ORAL_TABLET | ORAL | 2 refills | Status: DC

## 2022-08-04 NOTE — Progress Notes (Signed)
BH MD/PA/NP OP Progress Note Virtual Visit via Video Note  I connected with Jade [name] on 08/04/22 at  3:30 PM EDT by a video enabled telemedicine application and verified that I am speaking with the correct person using two identifiers.  Location: Patient: Home Provider: Clinic   I discussed the limitations of evaluation and management by telemedicine and the availability of in person appointments. The patient expressed understanding and agreed to proceed.  I provided 30 minutes of non-face-to-face time during this encounter.      08/04/2022 8:59 AM Jade [name]  MRN:  [id number]  Chief Complaint: "I the past few days has been alot"  HPI: 47 year old female seen today for follow up psychiatry evaluation.  She has a psychiatric history of PTSD, Depression, Bipolar 2, Anxiety, and SI. She currently managed on Lamictal 150 mg daily, gabapentin 400 mg four  times daily, Wellbutrin XL 150 daily, and Trazodone 50 mg nightly as needed. She notes that she has not had gabapentin for months but would like to restart it.Today she notes her medications are somewhat effective in managing her psychiatric conditions.    Today, she was well groomed, pleasant, cooperative, engaged in conversation, and maintained eye contact. She notes that for the past few days things has been stressful. She notes that she is concerned about her 93 years son who has special needs. She reports that he has been having a difficult time in school. She is also concerned about his older son. She reports that she and her mother are not speaking. She also reports that her friends has been going through a lot and not been able to support her. She reports that she copes by isolating. She notes isolating herself helps manage her irritability. She continues to endorse symptoms of hypomania such as distractibility, irritability, racing thoughts, and fluctuations in mood.  Patient reports that she has been having AH. She reports that  she hears her son calling her or a faint sound in the distance. She denies paranoia. Patient notes that her sleep is adequate.  She notes that she sleeps 6 to 8 hours nightly.  Patient endorses passive SI but denies wanting to harm herself today.  She denies SI/HI/AVH or paranoia.    Patient reports that her anxiety continues to be problematic.  Provider conducted a GAD-7 and patient scored a 15, at her last visit she scored a 19.  Provider also conducted PHQ-9 and patient scored a 15, at her last visit she scored a 25.  She endorses adequate appetite and notes that she has lost 10 pounds since her last visit. Patient report that she is looking forward to having weight loss surgery.     Patient reports that she continues to be in pain in her back. She she has not had gabapentin in over 4 months.  She notes that she has  been prescribed oxycodone by her PCP.  Patient reports that she is still concerned about getting her disability approved. She notes that between her chronic pain and mental health issues she finds work challenging.  Today she is agreeable to restarting gabapentin 300 mg three times daily to help manage mood. She is also agreeable to Starting Abilify 5 mg to help manage psychosis and depression. She will continue all other medications as prescribed. No other concerns at this time.  Visit Diagnosis:    ICD-10-CM   1. Bipolar 2 disorder, major depressive episode ([name])  F31.81 buPROPion (WELLBUTRIN XL) 150 MG 24 hr  tablet    gabapentin (NEURONTIN) 300 MG capsule    lamoTRIgine (LAMICTAL) 150 MG tablet    traZODone (DESYREL) 100 MG tablet    ARIPiprazole (ABILIFY) 5 MG tablet    2. Anxiety  F41.9 gabapentin (NEURONTIN) 300 MG capsule    traZODone (DESYREL) 100 MG tablet      Past Psychiatric History: PTSD, Depression, Bipolar 2, Anxiety, and SI  Past Medical History:  Past Medical History:  Diagnosis Date   Abnormal Pap smear of cervix    Anxiety    Depression    Vitamin D  deficiency 04/2019    Past Surgical History:  Procedure Laterality Date   CERVICAL BIOPSY  W/ LOOP ELECTRODE EXCISION  03-2013   CESAREAN SECTION     x 1   COLPOSCOPY     TUBAL LIGATION      Family Psychiatric History:  Mom- depression, anxiety  Family History:  Family History  Problem Relation Age of Onset   Hypertension Mother    Diabetes Mother    Hyperlipidemia Brother    Pancreatic cancer Maternal Grandmother    Diabetes Maternal Grandmother     Social History:  Social History   Socioeconomic History   Marital status: Single    Spouse name: Not on file   Number of children: 2   Years of education: 14   Highest education level: Some college, no degree  Occupational History   Not on file  Tobacco Use   Smoking status: Never   Smokeless tobacco: Never  Vaping Use   Vaping Use: Never used  Substance and Sexual Activity   Alcohol use: No    Comment: socially    Drug use: No   Sexual activity: Not Currently    Birth control/protection: Surgical  Other Topics Concern   Not on file  Social History Narrative   Marital status: single; dating seriously x 7 years.      Children:  2 children (14, 6)      Lives: with two sons      Employment:  Conservation officer, nature at [employer]; first shift for 8 years.      Tobacco: none      Alcohol:  4-6 drinks per month      Drugs: none      Exercise:  Sporadic; twice weekly; walking      Seatbelt: 100%      Guns: none      Sexual activity:  Total partners = 6; history of Chlamydia in 2002; last STD screening 2016; males only   Social Determinants of Health   Financial Resource Strain: Not on file  Food Insecurity: Not on file  Transportation Needs: Not on file  Physical Activity: Not on file  Stress: Not on file  Social Connections: Not on file    Allergies: No Known Allergies  Metabolic Disorder Labs: Lab Results  Component Value Date   HGBA1C 6.3 (H) 08/31/2020   MPG 134 08/31/2020   MPG 120 (H) 11/17/2014   No results found  for: "PROLACTIN" Lab Results  Component Value Date   CHOL 195 08/31/2020   TRIG 92 08/31/2020   HDL 49 08/31/2020   CHOLHDL 4.0 08/31/2020   VLDL 18 08/31/2020   LDLCALC 128 (H) 08/31/2020   LDLCALC 113 (H) 05/24/2019   Lab Results  Component Value Date   TSH 1.315 08/31/2020   TSH 0.572 05/24/2019    Therapeutic Level Labs: No results found for: "LITHIUM" No results found for: "VALPROATE" No results found for: "  CBMZ"  Current Medications: Current Outpatient Medications  Medication Sig Dispense Refill   ARIPiprazole (ABILIFY) 5 MG tablet Take 1 tablet (5 mg total) by mouth daily. 30 tablet 3   buPROPion (WELLBUTRIN XL) 150 MG 24 hr tablet TAKE 1 TABLET BY MOUTH EVERY DAY IN THE MORNING 90 tablet 2   diclofenac (VOLTAREN) 75 MG EC tablet Take 75 mg by mouth 2 (two) times daily.     fluconazole (DIFLUCAN) 200 MG tablet Take 1 tablet (200 mg total) by mouth every 3 (three) days. 3 tablet 2   gabapentin (NEURONTIN) 300 MG capsule Take 1 capsule (300 mg total) by mouth 3 (three) times daily. 90 capsule 3   HYDROcodone-acetaminophen (NORCO) 10-325 MG tablet Take 1 tablet by mouth every 6 (six) hours as needed.     lamoTRIgine (LAMICTAL) 150 MG tablet Take 1 tablet (150 mg total) by mouth daily. For mood stabilization 30 tablet 3   traZODone (DESYREL) 100 MG tablet TAKE 1 TABLET BY MOUTH EVERYDAY AT BEDTIME 90 tablet 2   Vitamin D, Ergocalciferol, (DRISDOL) 1.25 MG (50000 UNIT) CAPS capsule Take 50,000 Units by mouth every 7 (seven) days.     No current facility-administered medications for this visit.     Musculoskeletal: Strength & Muscle Tone: within normal limits and Telehealth visit Gait & Station: normal, Telehealth visit Patient leans: N/A  Psychiatric Specialty Exam: Review of Systems  There were no vitals taken for this visit.There is no height or weight on file to calculate BMI.  General Appearance: Well Groomed  Eye Contact:  Good  Speech:  Clear and Coherent and  Normal Rate  Volume:  Normal  Mood:  Anxious and Depressed  Affect:  Appropriate and Congruent  Thought Process:  Coherent, Goal Directed, and NA  Orientation:  Full (Time, Place, and Person)  Thought Content: WDL and Logical   Suicidal Thoughts:  No  Homicidal Thoughts:  No  Memory:  Immediate;   Good Recent;   Good Remote;   Good  Judgement:  Good  Insight:  Good  Psychomotor Activity:  Normal  Concentration:  Concentration: Good and Attention Span: Good  Recall:  Good  Fund of Knowledge: Good  Language: Good  Akathisia:  No  Handed:  Right  AIMS (if indicated): not done  Assets:  Communication Skills Desire for Improvement Financial Resources/Insurance Housing Leisure Time Social Support  ADL's:  Intact  Cognition: WNL  Sleep:  Good   Screenings: GAD-7    Flowsheet Row Video Visit from 08/04/2022 in [facility] Video Visit from 12/30/2021 in [facility] Video Visit from 09/27/2021 in [facility] Counselor from 06/28/2021 in [facility] Video Visit from 12/30/2020 in [facility]  Total GAD-7 Score 15 19 16 3 4       PHQ2-9    Flowsheet Row Video Visit from 08/04/2022 in [facility] Video Visit from 12/30/2021 in [place] Video Visit from 09/27/2021 in [facility] Counselor from 06/28/2021 in [facility] Video Visit from 12/30/2020 in [facility]  PHQ-2 Total Score 5 6 6 1  0  PHQ-9 Total Score 15 25 20 8 4       Flowsheet Row Video Visit from 08/04/2022 in [facility] Video Visit from 12/30/2021 in [facility] Video Visit from 10/02/2020 in [facility]  C-SSRS RISK CATEGORY Error: Q7 should not be populated when Q6  is No Error: Q7 should not be populated when Q6 is No Error: Question 6 not populated        Assessment and Plan: Patient endorses symptoms of anxiety, AH and hypomania.  Today she is agreeable to restarting gabapentin 300 mg three times daily to help manage mood. She is also agreeable to Starting Abilify 5 mg to help manage psychosis and depression. She will continue all other medications as prescribed.   1. Bipolar 2 disorder, major depressive episode ([name])  Continue- buPROPion (WELLBUTRIN XL) 150 MG 24 hr tablet; TAKE 1 TABLET BY MOUTH EVERY DAY IN THE MORNING  Dispense: 90 tablet; Refill: 2 Restart- gabapentin (NEURONTIN) 300 MG capsule; Take 1 capsule (300 mg total) by mouth 3 (three) times daily.  Dispense: 90 capsule; Refill: 3 Continue- lamoTRIgine (LAMICTAL) 150 MG tablet; Take 1 tablet (150 mg total) by mouth daily. For mood stabilization  Dispense: 30 tablet; Refill: 3 Continue- traZODone (DESYREL) 100 MG tablet; TAKE 1 TABLET BY MOUTH EVERYDAY AT BEDTIME  Dispense: 90 tablet; Refill: 2 Start- ARIPiprazole (ABILIFY) 5 MG tablet; Take 1 tablet (5 mg total) by mouth daily.  Dispense: 30 tablet; Refill: 3  2. Anxiety  Restart- gabapentin (NEURONTIN) 300 MG capsule; Take 1 capsule (300 mg total) by mouth 3 (three) times daily.  Dispense: 90 capsule; Refill: 3 Continue- traZODone (DESYREL) 100 MG tablet; TAKE 1 TABLET BY MOUTH EVERYDAY AT BEDTIME  Dispense: 90 tablet; Refill: 2  Follow up in 2.5 months Follow up with therapy  Shanna Cisco, NP 08/04/2022, 8:59 AM

## 2022-08-27 ENCOUNTER — Other Ambulatory Visit (HOSPITAL_COMMUNITY): Payer: Self-pay | Admitting: Psychiatry

## 2022-08-27 DIAGNOSIS — F3181 Bipolar II disorder: Secondary | ICD-10-CM

## 2022-09-08 ENCOUNTER — Encounter (HOSPITAL_COMMUNITY): Payer: Self-pay

## 2022-09-08 ENCOUNTER — Ambulatory Visit (HOSPITAL_COMMUNITY): Payer: Medicaid Other | Admitting: Clinical

## 2022-09-21 ENCOUNTER — Ambulatory Visit
Admission: RE | Admit: 2022-09-21 | Discharge: 2022-09-21 | Disposition: A | Discharge status: Home / Self Care | Payer: Medicaid Other | Source: Ambulatory Visit | Attending: Nurse Practitioner | Admitting: Nurse Practitioner

## 2022-09-21 DIAGNOSIS — Z1231 Encounter for screening mammogram for malignant neoplasm of breast: Secondary | ICD-10-CM

## 2022-09-23 ENCOUNTER — Ambulatory Visit (INDEPENDENT_AMBULATORY_CARE_PROVIDER_SITE_OTHER): Payer: Medicaid Other | Admitting: Clinical

## 2022-09-23 DIAGNOSIS — F3181 Bipolar II disorder: Secondary | ICD-10-CM

## 2022-09-23 NOTE — Progress Notes (Unsigned)
   THERAPIST PROGRESS NOTE Virtual Visit via Video Note  I connected with Jade [name] on 09/23/2022 at  8:00 AM EDT by a video enabled telemedicine application and verified that I am speaking with the correct person using two identifiers.  Location: Patient: home Provider: office   I discussed the limitations of evaluation and management by telemedicine and the availability of in person appointments. The patient expressed understanding and agreed to proceed.   Follow Up Instructions: I discussed the assessment and treatment plan with the patient. The patient was provided an opportunity to ask questions and all were answered. The patient agreed with the plan and demonstrated an understanding of the instructions.   The patient was advised to call back or seek an in-person evaluation if the symptoms worsen or if the condition fails to improve as anticipated.    Session Time: 20 minutes  Participation Level: Active  Behavioral Response: CasualAlertEuthymic  Type of Therapy: Individual Therapy  Treatment Goals addressed: client will participate in 80% of scheduled individual psychotherapy sessions  ProgressTowards Goals: Progressing  Interventions: CBT and Supportive  Summary:  Jade [name] is a 47 y.o. female who presents  for the scheduled appointment oriented times five, appropriately dressed and friendly. Client denied hallucinations and delusions. Client reported she is doing very well today. Client reported she has been busy going to a lot of appointments. Client reported she is preparing to finish her appointments at the weight management clinic. Client reported she had to do psychological testing with the doctor before being scheduled for the procedure. Client reported she would just be anxious about her after care and making sure she does right but that is normal. Client reported her weight has been a part of her depression so having this done would improve her mood.  Client reported she has been doing well otherwise and maintaining her place well. Client reported she had a medication added to her regimen but she is adjusting well to that. Client reported no other concerns at this time. Evidence of progress towards goal:  client reported 1 positive of medication compliance daily.  Suicidal/Homicidal: Nowithout intent/plan  Therapist Response:  Therapist began the appointment asking the client how she has been doing since last seen. Therapist used CBT to engage using active listening and positive emotional support. Therapist used CBT to engage and ask the client about her depressive symptoms and medication compliance. Therapist used CBT to engage and ask the client about her thoughts on weight loss surgery and preparation. Therapist used CBT ask the client to identify her progress with frequency of use with coping skills with continued practice in her daily activity.    Therapist assigned the client homework to practice self care.   Plan: Return again in 4 weeks.  Diagnosis: bipolar 2 disorder, major depressive episode  Collaboration of Care: Patient refused AEB none requested by the client.  Patient/Guardian was advised Release of Information must be obtained prior to any record release in order to collaborate their care with an outside provider. Patient/Guardian was advised if they have not already done so to contact the registration department to sign all necessary forms in order for [place] to release information regarding their care.   Consent: Patient/Guardian gives verbal consent for treatment and assignment of benefits for services provided during this visit. Patient/Guardian expressed understanding and agreed to proceed.   Jade Rhymes [place], LCSW 09/23/2022

## 2022-09-24 ENCOUNTER — Emergency Department (HOSPITAL_COMMUNITY)
Admission: EM | Admit: 2022-09-24 | Discharge: 2022-09-24 | Disposition: A | Discharge status: Home / Self Care | Payer: Medicaid Other | Attending: Emergency Medicine | Admitting: Emergency Medicine

## 2022-09-24 ENCOUNTER — Other Ambulatory Visit: Payer: Self-pay

## 2022-09-24 DIAGNOSIS — K6289 Other specified diseases of anus and rectum: Secondary | ICD-10-CM | POA: Insufficient documentation

## 2022-09-24 DIAGNOSIS — R1084 Generalized abdominal pain: Secondary | ICD-10-CM | POA: Insufficient documentation

## 2022-09-24 DIAGNOSIS — R198 Other specified symptoms and signs involving the digestive system and abdomen: Secondary | ICD-10-CM

## 2022-09-24 LAB — CBC
HCT: 40.8 % (ref 36.0–46.0)
Hemoglobin: 13 g/dL (ref 12.0–15.0)
MCH: 28.4 pg (ref 26.0–34.0)
MCHC: 31.9 g/dL (ref 30.0–36.0)
MCV: 89.1 fL (ref 80.0–100.0)
Platelets: 268 10*3/uL (ref 150–400)
RBC: 4.58 MIL/uL (ref 3.87–5.11)
RDW: 13.5 % (ref 11.5–15.5)
WBC: 7.1 10*3/uL (ref 4.0–10.5)
nRBC: 0 % (ref 0.0–0.2)

## 2022-09-24 LAB — URINALYSIS, ROUTINE W REFLEX MICROSCOPIC
Bilirubin Urine: NEGATIVE
Glucose, UA: NEGATIVE mg/dL
Hgb urine dipstick: NEGATIVE
Ketones, ur: NEGATIVE mg/dL
Leukocytes,Ua: NEGATIVE
Nitrite: NEGATIVE
Protein, ur: NEGATIVE mg/dL
Specific Gravity, Urine: 1.02 (ref 1.005–1.030)
pH: 5 (ref 5.0–8.0)

## 2022-09-24 LAB — COMPREHENSIVE METABOLIC PANEL
ALT: 17 U/L (ref 0–44)
AST: 15 U/L (ref 15–41)
Albumin: 3.3 g/dL — ABNORMAL LOW (ref 3.5–5.0)
Alkaline Phosphatase: 51 U/L (ref 38–126)
Anion gap: 7 (ref 5–15)
BUN: 10 mg/dL (ref 6–20)
CO2: 25 mmol/L (ref 22–32)
Calcium: 8.3 mg/dL — ABNORMAL LOW (ref 8.9–10.3)
Chloride: 107 mmol/L (ref 98–111)
Creatinine, Ser: 1.05 mg/dL — ABNORMAL HIGH (ref 0.44–1.00)
GFR, Estimated: >60 mL/min (ref >60)
Glucose, Bld: 95 mg/dL (ref 70–99)
Potassium: 3.9 mmol/L (ref 3.5–5.1)
Sodium: 139 mmol/L (ref 135–145)
Total Bilirubin: 0.5 mg/dL (ref 0.3–1.2)
Total Protein: 6 g/dL — ABNORMAL LOW (ref 6.5–8.1)

## 2022-09-24 LAB — HCG, SERUM, QUALITATIVE: Preg, Serum: NEGATIVE

## 2022-09-24 LAB — LIPASE, BLOOD: Lipase: 35 U/L (ref 11–51)

## 2022-09-24 NOTE — ED Notes (Signed)
Pt reports rectal pressure at rest and pain with BMs. Pt LBM was described as runny. Denies rectal itching, or blood on stool.

## 2022-09-24 NOTE — ED Provider Notes (Signed)
[place] EMERGENCY DEPARTMENT AT [facility] Provider Note   CSN: [id number] Arrival date & time: 09/24/22  1727     History  Chief Complaint  Patient presents with   Rectal Pain    Jade [name] is a 47 y.o. female with past medical history significant for obesity, menometrorrhagia, bipolar, anxiety who presents with concern for generalized diffuse abdominal pain on and off for 3 weeks, as well as some pressure around the rectum separate from bowel movements for the last several weeks worse over the last 4 days.  Patient reports that she has had a similar pressure in the past associated with when she was ovulating, because of her irregular menstrual cycles she is not sure when she can expect to be feeling these kinds of sensations.  Reports her last regular period was in September of last year.  She endorses tubal ligation at the time.  She denies any fever, chills, reports that she did try to take a laxative and has been having normal bowel movements, denies excessive diarrhea, constipation.  She reports that the rectal sensation is more of a pressure than a pain.  Not significantly relieved after a bowel movement.  She denies any dysuria, hematuria, vaginal discharge.  HPI     Home Medications Prior to Admission medications   Medication Sig Start Date End Date Taking? Authorizing Provider  ARIPiprazole (ABILIFY) 5 MG tablet TAKE 1 TABLET (5 MG TOTAL) BY MOUTH DAILY. 08/30/22   Shanna Cisco, NP  buPROPion (WELLBUTRIN XL) 150 MG 24 hr tablet TAKE 1 TABLET BY MOUTH EVERY DAY IN THE MORNING 08/04/22   Toy Cookey E, NP  diclofenac (VOLTAREN) 75 MG EC tablet Take 75 mg by mouth 2 (two) times daily.    [provider]  fluconazole (DIFLUCAN) 200 MG tablet Take 1 tablet (200 mg total) by mouth every 3 (three) days. 02/01/22   Brock Bad, MD  gabapentin (NEURONTIN) 300 MG capsule Take 1 capsule (300 mg total) by mouth 3 (three) times daily. 08/04/22    Shanna Cisco, NP  HYDROcodone-acetaminophen (NORCO) 10-325 MG tablet Take 1 tablet by mouth every 6 (six) hours as needed.    [provider]  lamoTRIgine (LAMICTAL) 150 MG tablet Take 1 tablet (150 mg total) by mouth daily. For mood stabilization 08/04/22   Toy Cookey E, NP  traZODone (DESYREL) 100 MG tablet TAKE 1 TABLET BY MOUTH EVERYDAY AT BEDTIME 08/04/22   Toy Cookey E, NP  Vitamin D, Ergocalciferol, (DRISDOL) 1.25 MG (50000 UNIT) CAPS capsule Take 50,000 Units by mouth every 7 (seven) days.    [provider]      Allergies    Patient has no known allergies.    Review of Systems   Review of Systems  All other systems reviewed and are negative.   Physical Exam Updated Vital Signs BP 118/89 (BP Location: Left Arm)   Pulse 75   Temp 98.7 F (37.1 C) (Oral)   Resp 18   Ht 5\' 2"  (1.575 m)   Wt 112.5 kg   LMP 11/26/2021   SpO2 98%   BMI 45.36 kg/m  Physical Exam Vitals and nursing note reviewed.  Constitutional:      General: She is not in acute distress.    Appearance: Normal appearance.  HENT:     Head: Normocephalic and atraumatic.  Eyes:     General:        Right eye: No discharge.  Left eye: No discharge.  Cardiovascular:     Rate and Rhythm: Normal rate and regular rhythm.  Pulmonary:     Effort: Pulmonary effort is normal. No respiratory distress.  Abdominal:     Comments: No significant tenderness to palpation throughout abdomen, no rebound, rigidity, guarding  Genitourinary:    Comments: Normal appearance of the external rectum, no evidence of internal hemorrhoid or other rectal mass on digital rectal exam, normal appearance of stool Musculoskeletal:        General: No deformity.  Skin:    General: Skin is warm and dry.  Neurological:     Mental Status: She is alert and oriented to person, place, and time.  Psychiatric:        Mood and Affect: Mood normal.        Behavior: Behavior normal.     ED Results /  Procedures / Treatments   Labs (all labs ordered are listed, but only abnormal results are displayed) Labs Reviewed  COMPREHENSIVE METABOLIC PANEL - Abnormal; Notable for the following components:      Result Value   Creatinine, Ser 1.05 (*)    Calcium 8.3 (*)    Total Protein 6.0 (*)    Albumin 3.3 (*)    All other components within normal limits  LIPASE, BLOOD  CBC  URINALYSIS, ROUTINE W REFLEX MICROSCOPIC  HCG, SERUM, QUALITATIVE    EKG None  Radiology No results found.  Procedures Procedures    Medications Ordered in ED Medications - No data to display  ED Course/ Medical Decision Making/ A&P                             Medical Decision Making Amount and/or Complexity of Data Reviewed Labs: ordered.   This patient is a 47 y.o. female who presents to the ED for concern of generalized abdominal pain, rectal pressure.   Differential diagnoses prior to evaluation: The causes of generalized abdominal pain include but are not limited to AAA, mesenteric ischemia, appendicitis, diverticulitis, DKA, gastritis, gastroenteritis, AMI, nephrolithiasis, pancreatitis, peritonitis, adrenal insufficiency,lead poisoning, iron toxicity, intestinal ischemia, constipation, UTI,SBO/LBO, splenic rupture, biliary disease, IBD, IBS, PUD, or hepatitis. Considered rectal mass, rectal cancer, internal or external hemorrhoids, versus other  Past Medical History / Social History / Additional history: Chart reviewed. Pertinent results include: obesity, menometrorrhagia, bipolar, anxiety  Physical Exam: Physical exam performed. The pertinent findings include: Normal appearance of the external rectum, no evidence of internal hemorrhoid or other rectal mass on digital rectal exam, normal appearance of stool  Benign abdominal exam, stable vital signs  I independently interpreted lab work including CBC, CMP, lipase, UA, and serum pregnancy test which were all overall unremarkable with no notable  cause of her abdominal pain or rectal pressure.  Medications / Treatment: Discussed with patient that I do not see an emergent, surgical, or infectious cause of her generalized abdominal pain for several weeks and rectal pressure, she is in no acute distress and stable for discharge at this time, encourage close PCP follow-up as well as GI follow-up for colonoscopy or further evaluation of rectal pressure if this sensation persists   Disposition: After consideration of the diagnostic results and the patients response to treatment, I feel that patient stable for discharge with plan as above.   emergency department workup does not suggest an emergent condition requiring admission or immediate intervention beyond what has been performed at this time. The plan is: as above.  The patient is safe for discharge and has been instructed to return immediately for worsening symptoms, change in symptoms or any other concerns.  Final Clinical Impression(s) / ED Diagnoses Final diagnoses:  Generalized abdominal pain  Rectal pressure    Rx / DC Orders ED Discharge Orders     None         [place] 09/24/22 2324    Ernie Avena, MD 09/24/22 2340

## 2022-09-24 NOTE — Discharge Instructions (Signed)
Please call and make an appointment with the gastroenterologist for further of your care.  I do not see any acute emergent or surgical cause of your abdominal pain or rectal pressure today.  Your rectal exam was reassuring, but if the pressure persists I recommend further evaluation

## 2022-09-24 NOTE — ED Notes (Signed)
Pt provided discharge instructions and prescription information. Pt was given the opportunity to ask questions and questions were answered.   

## 2022-09-24 NOTE — ED Triage Notes (Signed)
Pt arrived via POV. C/o gen abd pain, with the feeling of "pressure in their bottom" for several weeks that has worsened over the past 4x days. Normal Bms.  C-section in 2009

## 2022-09-26 ENCOUNTER — Telehealth: Payer: Self-pay

## 2022-09-26 NOTE — Transitions of Care (Post Inpatient/ED Visit) (Cosign Needed)
   09/26/2022  Name: Jade [name] MRN: [id number] DOB: December 01, 1975  Today's TOC FU Call Status: Today's TOC FU Call Status:: Successful TOC FU Call Competed TOC FU Call Complete Date: 09/26/22  Transition Care Management Follow-up Telephone Call Date of Discharge: 09/24/22 Discharge Facility: [facility]) Type of Discharge: Emergency Department Reason for ED Visit: Other: How have you been since you were released from the hospital?: Better Any questions or concerns?: No  Items Reviewed: Did you receive and understand the discharge instructions provided?: Yes Medications obtained,verified, and reconciled?: Yes (Medications Reviewed) Any new allergies since your discharge?: No Dietary orders reviewed?: NA Do you have support at home?: Yes People in Home: child(ren), adult  Medications Reviewed Today: Medications Reviewed Today     Reviewed by Renelda Loma, RMA (Registered Medical Assistant) on 09/26/22 at 1549  Med List Status: <None>   Medication Order Taking? Sig Documenting Provider Last Dose Status Informant  ARIPiprazole (ABILIFY) 5 MG tablet [id number] Yes TAKE 1 TABLET (5 MG TOTAL) BY MOUTH DAILY. Shanna Cisco, NP Taking Active   buPROPion (WELLBUTRIN XL) 150 MG 24 hr tablet [id number] Yes TAKE 1 TABLET BY MOUTH EVERY DAY IN THE MORNING Toy Cookey E, NP Taking Active   diclofenac (VOLTAREN) 75 MG EC tablet [id number] Yes Take 75 mg by mouth 2 (two) times daily. [provider] Taking Active   fluconazole (DIFLUCAN) 200 MG tablet [id number] No Take 1 tablet (200 mg total) by mouth every 3 (three) days.  Patient not taking: Reported on 09/26/2022   Brock Bad, MD Not Taking Active   gabapentin (NEURONTIN) 300 MG capsule [id number] Yes Take 1 capsule (300 mg total) by mouth 3 (three) times daily. Shanna Cisco, NP Taking Active   HYDROcodone-acetaminophen [facility]) 10-325 MG tablet [id number] Yes Take 1 tablet by mouth every 6 (six) hours as  needed. [provider] Taking Active   lamoTRIgine (LAMICTAL) 150 MG tablet [id number] Yes Take 1 tablet (150 mg total) by mouth daily. For mood stabilization Toy Cookey E, NP Taking Active   traZODone (DESYREL) 100 MG tablet [id number] Yes TAKE 1 TABLET BY MOUTH EVERYDAY AT BEDTIME Toy Cookey E, NP Taking Active   Vitamin D, Ergocalciferol, (DRISDOL) 1.25 MG (50000 UNIT) CAPS capsule [id number] Yes Take 50,000 Units by mouth every 7 (seven) days. [provider] Taking Active             Home Care and Equipment/Supplies: Were Home Health Services Ordered?: NA Any new equipment or medical supplies ordered?: NA  Functional Questionnaire: Do you need assistance with bathing/showering or dressing?: No Do you need assistance with meal preparation?: No Do you need assistance with eating?: No Do you have difficulty maintaining continence: No Do you need assistance with getting out of bed/getting out of a chair/moving?: No Do you have difficulty managing or taking your medications?: No  Follow up appointments reviewed: PCP Follow-up appointment confirmed?: Yes Date of PCP follow-up appointment?: 10/11/22 Follow-up Provider: [facility] Follow-up appointment confirmed?: NA Do you need transportation to your follow-up appointment?: No Do you understand care options if your condition(s) worsen?: Yes-patient verbalized understanding  SDOH Interventions Today    Flowsheet Row Most Recent Value  SDOH Interventions   Food Insecurity Interventions Intervention Not Indicated  Housing Interventions Intervention Not Indicated  Utilities Interventions Intervention Not Indicated       SIGNATURE  Renelda Loma RMA

## 2022-10-11 ENCOUNTER — Inpatient Hospital Stay: Payer: Self-pay | Admitting: Nurse Practitioner

## 2022-10-13 ENCOUNTER — Telehealth (HOSPITAL_COMMUNITY): Payer: MEDICAID | Admitting: Psychiatry

## 2022-10-13 ENCOUNTER — Encounter (HOSPITAL_COMMUNITY): Payer: Self-pay | Admitting: Psychiatry

## 2022-10-13 DIAGNOSIS — F3181 Bipolar II disorder: Secondary | ICD-10-CM | POA: Diagnosis not present

## 2022-10-13 DIAGNOSIS — F419 Anxiety disorder, unspecified: Secondary | ICD-10-CM | POA: Diagnosis not present

## 2022-10-13 MED ORDER — TRAZODONE HCL 100 MG PO TABS: ORAL_TABLET | ORAL | 3 refills | Status: DC

## 2022-10-13 MED ORDER — GABAPENTIN 300 MG PO CAPS
300.0000 mg | ORAL_CAPSULE | Freq: Three times a day (TID) | ORAL | 3 refills | Status: DC
Start: 2022-10-13 — End: 2023-01-11

## 2022-10-13 MED ORDER — LAMOTRIGINE 150 MG PO TABS: 150.0000 mg | ORAL_TABLET | Freq: Every day | ORAL | 3 refills | Status: DC

## 2022-10-13 MED ORDER — BUPROPION HCL ER (XL) 150 MG PO TB24: ORAL_TABLET | ORAL | 3 refills | Status: DC

## 2022-10-13 MED ORDER — ARIPIPRAZOLE 5 MG PO TABS: 5.0000 mg | ORAL_TABLET | Freq: Every day | ORAL | 3 refills | Status: DC

## 2022-10-13 NOTE — Progress Notes (Signed)
BH MD/PA/NP OP Progress Note Virtual Visit via Video Note  I connected with Jade [name] on 10/13/22 at 11:00 AM EDT by a video enabled telemedicine application and verified that I am speaking with the correct person using two identifiers.  Location: Patient: Home Provider: Clinic   I discussed the limitations of evaluation and management by telemedicine and the availability of in person appointments. The patient expressed understanding and agreed to proceed.  I provided 30 minutes of non-face-to-face time during this encounter.      10/13/2022 11:08 AM Jade [name]  MRN:  [id number]  Chief Complaint: "Things are going good"  HPI: 47 year old female seen today for follow up psychiatry evaluation.  She has a psychiatric history of PTSD, Depression, Bipolar 2, Anxiety, and SI. She currently managed on Abilify 5 mg daily, Lamictal 150 mg daily, gabapentin 300 mg three  times daily, Wellbutrin XL 150 daily, and Trazodone 100 mg nightly as needed. She notes that her medications are effective in managing her psychiatric conditions.    Today, she was well groomed, pleasant, cooperative, engaged in conversation, and maintained eye contact. She informed Clinical research associate that things have been going good.  She reports that her mood is more stable and notes that her anxiety and depression has improved.  Today provider conducted GAD-7 and patient scored a 1, at her last visit she scored a 15.  Provider also conducted PHQ-9 and she scored a 1, at her last visit she scored a 15.  She endorses adequate sleep and appetite.  Today she denies SI/HI/VH, mania, paranoia.  Patient reports that she experienced a few auditory hallucinations of her son calling her when he is not there.  She however notes that this happens infrequently.   Patient reports that her youngest son will be going to ninth grade and is proud of his accomplishments.  She notes that her older son is doing well and no longer lives in the home.     No medication changes made today.  Patient agreeable to continue medication as prescribed.  No other concerns noted at this time.   Visit Diagnosis:    ICD-10-CM   1. Bipolar 2 disorder, major depressive episode ([name])  F31.81 ARIPiprazole (ABILIFY) 5 MG tablet    buPROPion (WELLBUTRIN XL) 150 MG 24 hr tablet    gabapentin (NEURONTIN) 300 MG capsule    lamoTRIgine (LAMICTAL) 150 MG tablet    traZODone (DESYREL) 100 MG tablet    2. Anxiety  F41.9 gabapentin (NEURONTIN) 300 MG capsule    traZODone (DESYREL) 100 MG tablet       Past Psychiatric History: PTSD, Depression, Bipolar 2, Anxiety, and SI  Past Medical History:  Past Medical History:  Diagnosis Date   Abnormal Pap smear of cervix    Anxiety    Depression    Vitamin D deficiency 04/2019    Past Surgical History:  Procedure Laterality Date   CERVICAL BIOPSY  W/ LOOP ELECTRODE EXCISION  03-2013   CESAREAN SECTION     x 1   COLPOSCOPY     TUBAL LIGATION      Family Psychiatric History:  Mom- depression, anxiety  Family History:  Family History  Problem Relation Age of Onset   Hypertension Mother    Diabetes Mother    Pancreatic cancer Maternal Grandmother    Diabetes Maternal Grandmother    Colon cancer Maternal Grandmother    Hyperlipidemia Brother     Social History:  Social History   Socioeconomic  History   Marital status: Single    Spouse name: Not on file   Number of children: 2   Years of education: 14   Highest education level: Some college, no degree  Occupational History   Not on file  Tobacco Use   Smoking status: Never   Smokeless tobacco: Never  Vaping Use   Vaping status: Never Used  Substance and Sexual Activity   Alcohol use: No    Comment: socially    Drug use: No   Sexual activity: Not Currently    Birth control/protection: Surgical  Other Topics Concern   Not on file  Social History Narrative   Marital status: single; dating seriously x 7 years.      Children:  2 children  (14, 6)      Lives: with two sons      Employment:  Conservation officer, nature at [employer]; first shift for 8 years.      Tobacco: none      Alcohol:  4-6 drinks per month      Drugs: none      Exercise:  Sporadic; twice weekly; walking      Seatbelt: 100%      Guns: none      Sexual activity:  Total partners = 6; history of Chlamydia in 2002; last STD screening 2016; males only   Social Determinants of Health   Financial Resource Strain: Not on file  Food Insecurity: No Food Insecurity (09/26/2022)   Hunger Vital Sign    Worried About Running Out of Food in the Last Year: Never true    Ran Out of Food in the Last Year: Never true  Transportation Needs: Not on file  Physical Activity: Not on file  Stress: Not on file  Social Connections: Not on file    Allergies: No Known Allergies  Metabolic Disorder Labs: Lab Results  Component Value Date   HGBA1C 6.3 (H) 08/31/2020   MPG 134 08/31/2020   MPG 120 (H) 11/17/2014   No results found for: "PROLACTIN" Lab Results  Component Value Date   CHOL 195 08/31/2020   TRIG 92 08/31/2020   HDL 49 08/31/2020   CHOLHDL 4.0 08/31/2020   VLDL 18 08/31/2020   LDLCALC 128 (H) 08/31/2020   LDLCALC 113 (H) 05/24/2019   Lab Results  Component Value Date   TSH 1.315 08/31/2020   TSH 0.572 05/24/2019    Therapeutic Level Labs: No results found for: "LITHIUM" No results found for: "VALPROATE" No results found for: "CBMZ"  Current Medications: Current Outpatient Medications  Medication Sig Dispense Refill   ARIPiprazole (ABILIFY) 5 MG tablet Take 1 tablet (5 mg total) by mouth daily. 90 tablet 3   buPROPion (WELLBUTRIN XL) 150 MG 24 hr tablet TAKE 1 TABLET BY MOUTH EVERY DAY IN THE MORNING 90 tablet 3   diclofenac (VOLTAREN) 75 MG EC tablet Take 75 mg by mouth 2 (two) times daily.     fluconazole (DIFLUCAN) 200 MG tablet Take 1 tablet (200 mg total) by mouth every 3 (three) days. (Patient not taking: Reported on 09/26/2022) 3 tablet 2   gabapentin  (NEURONTIN) 300 MG capsule Take 1 capsule (300 mg total) by mouth 3 (three) times daily. 90 capsule 3   HYDROcodone-acetaminophen (NORCO) 10-325 MG tablet Take 1 tablet by mouth every 6 (six) hours as needed.     lamoTRIgine (LAMICTAL) 150 MG tablet Take 1 tablet (150 mg total) by mouth daily. For mood stabilization 30 tablet 3   traZODone (DESYREL) 100  MG tablet TAKE 1 TABLET BY MOUTH EVERYDAY AT BEDTIME 90 tablet 3   Vitamin D, Ergocalciferol, (DRISDOL) 1.25 MG (50000 UNIT) CAPS capsule Take 50,000 Units by mouth every 7 (seven) days.     No current facility-administered medications for this visit.     Musculoskeletal: Strength & Muscle Tone: within normal limits and Telehealth visit Gait & Station: normal, Telehealth visit Patient leans: N/A  Psychiatric Specialty Exam: Review of Systems  Last menstrual period 11/26/2021.There is no height or weight on file to calculate BMI.  General Appearance: Well Groomed  Eye Contact:  Good  Speech:  Clear and Coherent and Normal Rate  Volume:  Normal  Mood:  Euthymic  Affect:  Appropriate and Congruent  Thought Process:  Coherent, Goal Directed, and NA  Orientation:  Full (Time, Place, and Person)  Thought Content: Logical and Hallucinations: Auditory Reports that she has auditory hallucinations but reports that they are not frequent    Suicidal Thoughts:  No  Homicidal Thoughts:  No  Memory:  Immediate;   Good Recent;   Good Remote;   Good  Judgement:  Good  Insight:  Good  Psychomotor Activity:  Normal  Concentration:  Concentration: Good and Attention Span: Good  Recall:  Good  Fund of Knowledge: Good  Language: Good  Akathisia:  No  Handed:  Right  AIMS (if indicated): not done  Assets:  Communication Skills Desire for Improvement Financial Resources/Insurance Housing Leisure Time Social Support  ADL's:  Intact  Cognition: WNL  Sleep:  Good   Screenings: GAD-7    Flowsheet Row Video Visit from 10/13/2022 in [facility] Video Visit from 08/04/2022 in [facility] Video Visit from 12/30/2021 in [facility] Video Visit from 09/27/2021 in [facility] Counselor from 06/28/2021 in [facility]  Total GAD-7 Score 1 15 19 16 3       PHQ2-9    Flowsheet Row Video Visit from 10/13/2022 in [facility] Video Visit from 08/04/2022 in [facility] Video Visit from 12/30/2021 in [facility] Video Visit from 09/27/2021 in [facility] Counselor from 06/28/2021 in [facility]  PHQ-2 Total Score 0 5 6 6 1   PHQ-9 Total Score 1 15 25 20 8       Flowsheet Row Video Visit from 10/13/2022 in [facility] ED from 09/24/2022 in [facility] Emergency Department at [facility] Video Visit from 08/04/2022 in [facility]  C-SSRS RISK CATEGORY No Risk No Risk Error: Q7 should not be populated when Q6 is No        Assessment and Plan: Patient notes that she is doing well on her current medication regimen.  No medication changes made today.  Patient agreeable to medications as prescribed.   1. Bipolar 2 disorder, major depressive episode ([name])  Continue- ARIPiprazole (ABILIFY) 5 MG tablet; Take 1 tablet (5 mg total) by mouth daily.  Dispense: 90 tablet; Refill: 3 Continue- buPROPion (WELLBUTRIN XL) 150 MG 24 hr tablet; TAKE 1 TABLET BY MOUTH EVERY DAY IN THE MORNING  Dispense: 90 tablet; Refill: 3 Continue- gabapentin (NEURONTIN) 300 MG capsule; Take 1 capsule (300 mg total) by mouth 3 (three) times daily.  Dispense: 90 capsule; Refill: 3 Continue- lamoTRIgine (LAMICTAL) 150 MG tablet; Take 1 tablet (150 mg total) by mouth daily. For mood stabilization  Dispense: 30 tablet; Refill: 3 Continue- traZODone  (DESYREL) 100 MG tablet; TAKE 1 TABLET BY MOUTH EVERYDAY AT BEDTIME  Dispense: 90 tablet; Refill: 3  2. Anxiety  Continue- gabapentin (NEURONTIN) 300 MG capsule; Take 1 capsule (300 mg total) by mouth 3 (three) times daily.  Dispense: 90 capsule; Refill: 3 Continue- traZODone (DESYREL) 100 MG tablet; TAKE 1 TABLET BY MOUTH EVERYDAY AT BEDTIME  Dispense: 90 tablet; Refill: 3  Follow up in 3 months Follow up with therapy  Shanna Cisco, NP 10/13/2022, 11:08 AM

## 2022-11-10 ENCOUNTER — Ambulatory Visit (INDEPENDENT_AMBULATORY_CARE_PROVIDER_SITE_OTHER): Payer: MEDICAID | Admitting: Clinical

## 2022-11-10 DIAGNOSIS — F3181 Bipolar II disorder: Secondary | ICD-10-CM

## 2022-12-01 ENCOUNTER — Ambulatory Visit (HOSPITAL_COMMUNITY): Payer: MEDICAID | Admitting: Clinical

## 2022-12-04 NOTE — Progress Notes (Signed)
THERAPIST PROGRESS NOTE Virtual Visit via Video Note  I connected with Jade [name] on 11/10/2022 at  4:00 PM EDT by a video enabled telemedicine application and verified that I am speaking with the correct person using two identifiers.  Location: Patient: home Provider: office   I discussed the limitations of evaluation and management by telemedicine and the availability of in person appointments. The patient expressed understanding and agreed to proceed.   Follow Up Instructions: I discussed the assessment and treatment plan with the patient. The patient was provided an opportunity to ask questions and all were answered. The patient agreed with the plan and demonstrated an understanding of the instructions.   The patient was advised to call back or seek an in-person evaluation if the symptoms worsen or if the condition fails to improve as anticipated.   Session Time: 20 minutes  Participation Level: Active  Behavioral Response: CasualAlertEuthymic  Type of Therapy: Individual Therapy  Treatment Goals addressed: Jade [name] WILL PARTICIPATE IN AT LEAST 80% OF SCHEDULED INDIVIDUAL PSYCHOTHERAPY SESSIONS   ProgressTowards Goals: Progressing  Interventions: CBT and Supportive  Summary:  Jade [name] is a 47 y.o. female who presents for the scheduled appointment oriented x 5, appropriately dressed, and friendly.  Client denied hallucinations and delusions. Client reported on today she is doing well.  Client reported things are continuing to go well at her home.  Client reported she will be scheduled for weight loss surgery September 16.  Client reported she is excited.  Client reported she will be staying with her mother for short period of time during her immediate recovery.  Client reported otherwise she received noticed that she was denied from disability for the second time.  Client reported she has a lawyer that she is going to get to help her.  Client reported she has had some  stress trying to get her son enrolled back into school.  Client reported otherwise both of her sons are doing well.  Client reported the medications that the psychiatrist had prescribed for her depression seem to be effective so far. Evidence of progress towards goal: Client reported medication compliant 7 days a week.  Suicidal/Homicidal: Nowithout intent/plan  Therapist Response:  Therapist began the appointment asking the client how she has been doing since last seen. Therapist used CBT to engage using active listening and positive emotional support. Therapist used CBT to engage and asked client how she has been coping with psychosocial stressors. Therapist used CBT to engage in the client asked her to identify the severity of her depression compared to ongoing psychiatric medication regimen. Therapist used CBT to teach the client about reinforcing clients very negative thoughts and engaging in activities that help to alleviate depression. Therapist used CBT ask the client to identify her progress with frequency of use with coping skills with continued practice in her daily activity.       Plan: Return again in 4 weeks.  Diagnosis: Bipolar 2 disorder, major depressive episode  Collaboration of Care: Patient refused AEB none requested by the client.  Patient/Guardian was advised Release of Information must be obtained prior to any record release in order to collaborate their care with an outside provider. Patient/Guardian was advised if they have not already done so to contact the registration department to sign all necessary forms in order for [place] to release information regarding their care.   Consent: Patient/Guardian gives verbal consent for treatment and assignment of benefits for services provided during this visit. Patient/Guardian expressed understanding  and agreed to proceed.   Neena Rhymes Analys Ryden, LCSW 11/10/2022

## 2022-12-15 ENCOUNTER — Encounter (HOSPITAL_COMMUNITY): Payer: Self-pay

## 2022-12-15 ENCOUNTER — Ambulatory Visit (HOSPITAL_COMMUNITY): Payer: MEDICAID | Admitting: Clinical

## 2022-12-25 ENCOUNTER — Telehealth (HOSPITAL_COMMUNITY): Payer: Self-pay | Admitting: Clinical

## 2022-12-25 NOTE — Telephone Encounter (Signed)
Therapist made contact with the client via MyChart video for the scheduled appointment time.  Client reported she completed the weight loss surgery on Monday of this past week.  Client reported she was able to go home Tuesday.  Client reported she has been at home.  Client reported she is able to do her ADLs just fine and is somewhat sore which is to be expected.  Client reported her mother is helping her get her son back and forth to school.  Client reported no concerns at this time.  Therapist did not talk to the client for long enough duration to bill for a progress note.

## 2022-12-26 NOTE — Progress Notes (Signed)
No show

## 2023-01-11 ENCOUNTER — Telehealth (INDEPENDENT_AMBULATORY_CARE_PROVIDER_SITE_OTHER): Payer: MEDICAID | Admitting: Psychiatry

## 2023-01-11 ENCOUNTER — Encounter (HOSPITAL_COMMUNITY): Payer: Self-pay | Admitting: Psychiatry

## 2023-01-11 DIAGNOSIS — F3181 Bipolar II disorder: Secondary | ICD-10-CM | POA: Diagnosis not present

## 2023-01-11 DIAGNOSIS — F419 Anxiety disorder, unspecified: Secondary | ICD-10-CM | POA: Diagnosis not present

## 2023-01-11 MED ORDER — LAMOTRIGINE 150 MG PO TABS: 150.0000 mg | ORAL_TABLET | Freq: Every day | ORAL | 3 refills | Status: DC

## 2023-01-11 MED ORDER — BUPROPION HCL ER (XL) 150 MG PO TB24: ORAL_TABLET | ORAL | 3 refills | Status: DC

## 2023-01-11 MED ORDER — GABAPENTIN 300 MG PO CAPS: 300.0000 mg | ORAL_CAPSULE | Freq: Three times a day (TID) | ORAL | 3 refills | Status: DC

## 2023-01-11 MED ORDER — ARIPIPRAZOLE 5 MG PO TABS: 5.0000 mg | ORAL_TABLET | Freq: Every day | ORAL | 3 refills | Status: DC

## 2023-01-11 MED ORDER — TRAZODONE HCL 150 MG PO TABS: ORAL_TABLET | ORAL | 1 refills | Status: DC

## 2023-01-11 NOTE — Progress Notes (Signed)
BH MD/PA/NP OP Progress Note Virtual Visit via Video Note  I connected with Jade [name] on 01/11/23 at 11:00 AM EDT by a video enabled telemedicine application and verified that I am speaking with the correct person using two identifiers.  Location: Patient: Home Provider: Clinic   I discussed the limitations of evaluation and management by telemedicine and the availability of in person appointments. The patient expressed understanding and agreed to proceed.  I provided 30 minutes of non-face-to-face time during this encounter.      01/11/2023 10:57 AM Jade [name]  MRN:  [id number]  Chief Complaint: "I have been a little irritable"  HPI: 47 year old female seen today for follow up psychiatry evaluation.  She has a psychiatric history of PTSD, Depression, Bipolar 2, Anxiety, and SI. She currently managed on Abilify 5 mg daily, Lamictal 150 mg daily, gabapentin 300 mg three  times daily, Wellbutrin XL 150 daily, and Trazodone 100 mg nightly as needed. She notes that her medications are effective in managing her psychiatric conditions.    Today, she was well groomed, pleasant, cooperative, engaged in conversation, and maintained eye contact. She informed Clinical research associate that she has been a little irritable. She also notes that her sleep has been poor. She notes that she sleeps 3-5 hours.  She does note that her anxiety and depression are well-managed.  Provider conducted a GAD-7 and patient scored a 5, at her last visit she scored a 1.  Provider also conducted a PHQ-9 of he scored a 6, at her last visit she scored a 1.  Today she endorses auditory hallucinations.  She informed Clinical research associate that she continues to hear her son called her name.  She denies SI/HI/VH mania or paranoia.   Patient notes that she has appealed her disability and has a hearing January 9th 2025. She is hopeful that it will be approved.  Patient reports that her weight loss surgery was successful.  She notes that she had it at  [facility] a little over a month ago.  Patient notes that she is lost 15 pounds since her surgery and denies adverse effects or pain.    Today trazodone 100 mg increased to 150 mg to help manage sleep.  Provider discussed the risk of being on 2 antidepressants.  She will continue other medications as prescribed.  No other concerns at this time. Visit Diagnosis:  No diagnosis found.    Past Psychiatric History: PTSD, Depression, Bipolar 2, Anxiety, and SI  Past Medical History:  Past Medical History:  Diagnosis Date   Abnormal Pap smear of cervix    Anxiety    Depression    Vitamin D deficiency 04/2019    Past Surgical History:  Procedure Laterality Date   CERVICAL BIOPSY  W/ LOOP ELECTRODE EXCISION  03-2013   CESAREAN SECTION     x 1   COLPOSCOPY     TUBAL LIGATION      Family Psychiatric History:  Mom- depression, anxiety  Family History:  Family History  Problem Relation Age of Onset   Hypertension Mother    Diabetes Mother    Pancreatic cancer Maternal Grandmother    Diabetes Maternal Grandmother    Colon cancer Maternal Grandmother    Hyperlipidemia Brother     Social History:  Social History   Socioeconomic History   Marital status: Single    Spouse name: Not on file   Number of children: 2   Years of education: 14   Highest education  level: Some college, no degree  Occupational History   Not on file  Tobacco Use   Smoking status: Never   Smokeless tobacco: Never  Vaping Use   Vaping status: Never Used  Substance and Sexual Activity   Alcohol use: No    Comment: socially    Drug use: No   Sexual activity: Not Currently    Birth control/protection: Surgical  Other Topics Concern   Not on file  Social History Narrative   Marital status: single; dating seriously x 7 years.      Children:  2 children (14, 6)      Lives: with two sons      Employment:  Conservation officer, nature at [employer]; first shift for 8 years.      Tobacco: none      Alcohol:  4-6 drinks  per month      Drugs: none      Exercise:  Sporadic; twice weekly; walking      Seatbelt: 100%      Guns: none      Sexual activity:  Total partners = 6; history of Chlamydia in 2002; last STD screening 2016; males only   Social Determinants of Health   Financial Resource Strain: Not on file  Food Insecurity: Low Risk  (12/12/2022)   Received from [facility]   Hunger Vital Sign    Worried About Running Out of Food in the Last Year: Never true    Ran Out of Food in the Last Year: Never true  Transportation Needs: No Transportation Needs (12/12/2022)   Received from [facility]    In the past 12 months, has lack of reliable transportation kept you from medical appointments, meetings, work or from getting things needed for daily living? : No  Physical Activity: Not on file  Stress: Not on file  Social Connections: Not on file    Allergies: No Known Allergies  Metabolic Disorder Labs: Lab Results  Component Value Date   HGBA1C 6.3 (H) 08/31/2020   MPG 134 08/31/2020   MPG 120 (H) 11/17/2014   No results found for: "PROLACTIN" Lab Results  Component Value Date   CHOL 195 08/31/2020   TRIG 92 08/31/2020   HDL 49 08/31/2020   CHOLHDL 4.0 08/31/2020   VLDL 18 08/31/2020   LDLCALC 128 (H) 08/31/2020   LDLCALC 113 (H) 05/24/2019   Lab Results  Component Value Date   TSH 1.315 08/31/2020   TSH 0.572 05/24/2019    Therapeutic Level Labs: No results found for: "LITHIUM" No results found for: "VALPROATE" No results found for: "CBMZ"  Current Medications: Current Outpatient Medications  Medication Sig Dispense Refill   ARIPiprazole (ABILIFY) 5 MG tablet Take 1 tablet (5 mg total) by mouth daily. 90 tablet 3   buPROPion (WELLBUTRIN XL) 150 MG 24 hr tablet TAKE 1 TABLET BY MOUTH EVERY DAY IN THE MORNING 90 tablet 3   diclofenac (VOLTAREN) 75 MG EC tablet Take 75 mg by mouth 2 (two) times daily.     fluconazole (DIFLUCAN) 200 MG tablet Take 1 tablet (200  mg total) by mouth every 3 (three) days. (Patient not taking: Reported on 09/26/2022) 3 tablet 2   gabapentin (NEURONTIN) 300 MG capsule Take 1 capsule (300 mg total) by mouth 3 (three) times daily. 90 capsule 3   HYDROcodone-acetaminophen (NORCO) 10-325 MG tablet Take 1 tablet by mouth every 6 (six) hours as needed.     lamoTRIgine (LAMICTAL) 150 MG tablet Take 1 tablet (150  mg total) by mouth daily. For mood stabilization 30 tablet 3   traZODone (DESYREL) 100 MG tablet TAKE 1 TABLET BY MOUTH EVERYDAY AT BEDTIME 90 tablet 3   Vitamin D, Ergocalciferol, (DRISDOL) 1.25 MG (50000 UNIT) CAPS capsule Take 50,000 Units by mouth every 7 (seven) days.     No current facility-administered medications for this visit.     Musculoskeletal: Strength & Muscle Tone: within normal limits and Telehealth visit Gait & Station: normal, Telehealth visit Patient leans: N/A  Psychiatric Specialty Exam: Review of Systems  There were no vitals taken for this visit.There is no height or weight on file to calculate BMI.  General Appearance: Well Groomed  Eye Contact:  Good  Speech:  Clear and Coherent and Normal Rate  Volume:  Normal  Mood:  Euthymic  Affect:  Appropriate and Congruent  Thought Process:  Coherent, Goal Directed, and NA  Orientation:  Full (Time, Place, and Person)  Thought Content: Logical and Hallucinations: Auditory Reports that she has auditory hallucinations but reports that they are not frequent    Suicidal Thoughts:  No  Homicidal Thoughts:  No  Memory:  Immediate;   Good Recent;   Good Remote;   Good  Judgement:  Good  Insight:  Good  Psychomotor Activity:  Normal  Concentration:  Concentration: Good and Attention Span: Good  Recall:  Good  Fund of Knowledge: Good  Language: Good  Akathisia:  No  Handed:  Right  AIMS (if indicated): not done  Assets:  Communication Skills Desire for Improvement Financial Resources/Insurance Housing Leisure Time Social Support  ADL's:   Intact  Cognition: WNL  Sleep:  Poor   Screenings: GAD-7    Flowsheet Row Video Visit from 10/13/2022 in [facility] Video Visit from 08/04/2022 in [facility] Video Visit from 12/30/2021 in [facility] Video Visit from 09/27/2021 in [facility] Counselor from 06[facility]28/2021 in [facility]  Total GAD-7 Score 1 15 19 16 3       PHQ2-9    Flowsheet Row Video Visit from 10/13/2022 in [facility] Video Visit from 08/04/2022 in [facility] Video Visit from 12/30/2021 in [facility] Video Visit from 09/27/2021 in [facility] Counselor from 06/28/2021 in [facility]  PHQ-2 Total Score 0 5 6 6 1   PHQ-9 Total Score 1 15 25 20 8       Flowsheet Row Video Visit from 10/13/2022 in [facility] ED from 09/24/2022 in [facility] Emergency Department at [facility] Video Visit from 08/04/2022 in [facility]  C-SSRS RISK CATEGORY No Risk No Risk Error: Q7 should not be populated when Q6 is No        Assessment and Plan: Patient notes that she has increased irritability, poor sleep, and AH (of her son calling her name).Today trazodone 100 mg increased to 150 mg to help manage sleep.  Provider discussed the risk of being on 2 antidepressants.  She will continue other medications as prescribed.     1. Bipolar 2 disorder, major depressive episode ([name])  Continue- ARIPiprazole (ABILIFY) 5 MG tablet; Take 1 tablet (5 mg total) by mouth daily.  Dispense: 90 tablet; Refill: 3 Continue- buPROPion (WELLBUTRIN XL) 150 MG 24 hr tablet; TAKE 1 TABLET BY MOUTH EVERY DAY IN THE MORNING  Dispense: 90 tablet; Refill: 3  Continue- gabapentin (NEURONTIN) 300 MG capsule; Take 1 capsule (300 mg  total) by mouth 3 (three) times daily.  Dispense: 90 capsule; Refill: 3 Continue- lamoTRIgine (LAMICTAL) 150 MG tablet; Take 1 tablet (150 mg total) by mouth daily. For mood stabilization  Dispense: 30 tablet; Refill: 3 Increased- traZODone (DESYREL) 150 MG tablet; TAKE 1 TABLET BY MOUTH EVERYDAY AT BEDTIME  Dispense: 90 tablet; Refill: 1  2. Anxiety  Continue- gabapentin (NEURONTIN) 300 MG capsule; Take 1 capsule (300 mg total) by mouth 3 (three) times daily.  Dispense: 90 capsule; Refill: 3 Increased- traZODone (DESYREL) 150 MG tablet; TAKE 1 TABLET BY MOUTH EVERYDAY AT BEDTIME  Dispense: 90 tablet; Refill: 1 Follow up in 3 months Follow up with therapy  Shanna Cisco, NP 01/11/2023, 10:57 AM

## 2023-02-13 ENCOUNTER — Telehealth (HOSPITAL_COMMUNITY): Payer: Self-pay | Admitting: Psychiatry

## 2023-02-16 ENCOUNTER — Other Ambulatory Visit (HOSPITAL_COMMUNITY): Payer: Self-pay

## 2023-02-16 ENCOUNTER — Encounter (HOSPITAL_COMMUNITY): Payer: Self-pay

## 2023-02-16 MED ORDER — WEGOVY 0.25 MG/0.5ML ~~LOC~~ SOAJ
0.2500 mg | SUBCUTANEOUS | 0 refills | Status: DC
  Filled 2023-02-16: qty 2, 28d supply, fill #0

## 2023-02-16 NOTE — Telephone Encounter (Signed)
Patient has not worked since 2022.  She asked Clinical research associate to fill out forms regarding her disability.  Provider was agreeable to this.  Patient informed writer that when she last worked she would be absent 10-15 times a month.  She notes that she had issues with concentration, completion of task, social interaction, and having to work for a full 8 hours.  Patient informed writer that while working she would be irritable, distracted, have fluctuations in mood, and hallucinations.  Patient does note that her anxiety and depression has improved.  She notes that her mood is somewhat better.  She does still notes that she has hallucinations.  Patient informed writer that at times she isolates herself and continues to be distracted and unable to complete tasks.

## 2023-02-17 ENCOUNTER — Other Ambulatory Visit (HOSPITAL_COMMUNITY): Payer: Self-pay

## 2023-03-07 ENCOUNTER — Other Ambulatory Visit: Payer: Self-pay

## 2023-03-07 ENCOUNTER — Other Ambulatory Visit (HOSPITAL_COMMUNITY): Payer: Self-pay

## 2023-03-07 MED ORDER — WEGOVY 0.5 MG/0.5ML ~~LOC~~ SOAJ
SUBCUTANEOUS | 0 refills | Status: DC
  Filled 2023-03-07 – 2023-03-16 (×3): qty 2, 28d supply, fill #0

## 2023-03-08 ENCOUNTER — Other Ambulatory Visit (HOSPITAL_COMMUNITY): Payer: Self-pay

## 2023-03-10 ENCOUNTER — Other Ambulatory Visit (HOSPITAL_COMMUNITY): Payer: Self-pay

## 2023-03-16 ENCOUNTER — Other Ambulatory Visit (HOSPITAL_COMMUNITY): Payer: Self-pay

## 2023-03-17 ENCOUNTER — Telehealth (HOSPITAL_COMMUNITY): Payer: MEDICAID | Admitting: Psychiatry

## 2023-03-17 ENCOUNTER — Encounter (HOSPITAL_COMMUNITY): Payer: Self-pay | Admitting: Psychiatry

## 2023-03-17 DIAGNOSIS — F419 Anxiety disorder, unspecified: Secondary | ICD-10-CM | POA: Diagnosis not present

## 2023-03-17 DIAGNOSIS — F3181 Bipolar II disorder: Secondary | ICD-10-CM

## 2023-03-17 MED ORDER — BUPROPION HCL ER (XL) 150 MG PO TB24: ORAL_TABLET | ORAL | 3 refills | Status: DC

## 2023-03-17 MED ORDER — TRAZODONE HCL 150 MG PO TABS: ORAL_TABLET | ORAL | 1 refills | Status: DC

## 2023-03-17 MED ORDER — LAMOTRIGINE 150 MG PO TABS: 150.0000 mg | ORAL_TABLET | Freq: Every day | ORAL | 3 refills | Status: DC

## 2023-03-17 MED ORDER — ARIPIPRAZOLE 5 MG PO TABS: 5.0000 mg | ORAL_TABLET | Freq: Every day | ORAL | 3 refills | Status: DC

## 2023-03-17 MED ORDER — GABAPENTIN 300 MG PO CAPS: 300.0000 mg | ORAL_CAPSULE | Freq: Three times a day (TID) | ORAL | 3 refills | Status: DC

## 2023-03-17 NOTE — Progress Notes (Signed)
BH MD/PA/NP OP Progress Note Virtual Visit via Video Note  I connected with Jade [name] on 03/17/23 at 11:00 AM EST by a video enabled telemedicine application and verified that I am speaking with the correct person using two identifiers.  Location: Patient: Home Provider: Clinic   I discussed the limitations of evaluation and management by telemedicine and the availability of in person appointments. The patient expressed understanding and agreed to proceed.  I provided 30 minutes of non-face-to-face time during this encounter.      03/17/2023 11:16 AM Jade [name]  MRN:  [id number]  Chief Complaint: "I have been trying to make it"  HPI: 47 year old female seen today for follow up psychiatry evaluation.  She has a psychiatric history of PTSD, Depression, Bipolar 2, Anxiety, and SI. She currently managed on Abilify 5 mg daily, Lamictal 150 mg daily, gabapentin 300 mg three  times daily, Wellbutrin XL 150 daily, and Trazodone 100 mg nightly as needed. She notes that her medications are effective in managing her psychiatric conditions.    Today she was well groomed, pleasant, cooperative, engaged in conversation, and maintained eye contact. She informed Clinical research associate that she is trying to make it. Patient note that she has back pain which she quantifies as 6/10. She reports that she went to have a cortisone injection yesterday. She notes that she also is followed by pain management.   Patient notes that at times her pain makes her irritable. She however notes that she is trying to cope. At times she notes that she becomes a little irritable. Despite the above she does note that her anxiety and depression are well-managed.  Provider conducted a GAD-7 and patient scored a 4, at her last visit she scored a 5.  Provider also conducted a PHQ-9 of he scored a 5, at her last visit she scored a 6.  She notes that her sleep has improved noting that she sleeps 6-7 hours nightly. Today she denies SI/HI/VH  mania or paranoia.  Patient notes that she continues to loss weight. Since her last visit she has lost 8 pounds.     Overall patient notes that she is doing well. No medication changes made today.  She will continue her  medications as prescribed.  No other concerns at this time. Visit Diagnosis:    ICD-10-CM   1. Bipolar 2 disorder, major depressive episode ([name])  F31.81 ARIPiprazole (ABILIFY) 5 MG tablet    buPROPion (WELLBUTRIN XL) 150 MG 24 hr tablet    gabapentin (NEURONTIN) 300 MG capsule    lamoTRIgine (LAMICTAL) 150 MG tablet    traZODone (DESYREL) 150 MG tablet    2. Anxiety  F41.9 gabapentin (NEURONTIN) 300 MG capsule    traZODone (DESYREL) 150 MG tablet        Past Psychiatric History: PTSD, Depression, Bipolar 2, Anxiety, and SI  Past Medical History:  Past Medical History:  Diagnosis Date   Abnormal Pap smear of cervix    Anxiety    Depression    Vitamin D deficiency 04/2019    Past Surgical History:  Procedure Laterality Date   CERVICAL BIOPSY  W/ LOOP ELECTRODE EXCISION  03-2013   CESAREAN SECTION     x 1   COLPOSCOPY     TUBAL LIGATION      Family Psychiatric History:  Mom- depression, anxiety  Family History:  Family History  Problem Relation Age of Onset   Hypertension Mother    Diabetes Mother    Pancreatic  cancer Maternal Grandmother    Diabetes Maternal Grandmother    Colon cancer Maternal Grandmother    Hyperlipidemia Brother     Social History:  Social History   Socioeconomic History   Marital status: Single    Spouse name: Not on file   Number of children: 2   Years of education: 14   Highest education level: Some college, no degree  Occupational History   Not on file  Tobacco Use   Smoking status: Never   Smokeless tobacco: Never  Vaping Use   Vaping status: Never Used  Substance and Sexual Activity   Alcohol use: No    Comment: socially    Drug use: No   Sexual activity: Not Currently    Birth control/protection:  Surgical  Other Topics Concern   Not on file  Social History Narrative   Marital status: single; dating seriously x 7 years.      Children:  2 children (14, 6)      Lives: with two sons      Employment:  Conservation officer, nature at [employer]; first shift for 8 years.      Tobacco: none      Alcohol:  4-6 drinks per month      Drugs: none      Exercise:  Sporadic; twice weekly; walking      Seatbelt: 100%      Guns: none      Sexual activity:  Total partners = 6; history of Chlamydia in 2002; last STD screening 2016; males only   Social Drivers of Corporate investment banker Strain: Not on file  Food Insecurity: Low Risk  (12/12/2022)   Received from [facility]   Hunger Vital Sign    Worried About Running Out of Food in the Last Year: Never true    Ran Out of Food in the Last Year: Never true  Transportation Needs: No Transportation Needs (12/12/2022)   Received from [facility]    In the past 12 months, has lack of reliable transportation kept you from medical appointments, meetings, work or from getting things needed for daily living? : No  Physical Activity: Not on file  Stress: Not on file  Social Connections: Not on file    Allergies: No Known Allergies  Metabolic Disorder Labs: Lab Results  Component Value Date   HGBA1C 6.3 (H) 08/31/2020   MPG 134 08/31/2020   MPG 120 (H) 11/17/2014   No results found for: "PROLACTIN" Lab Results  Component Value Date   CHOL 195 08/31/2020   TRIG 92 08/31/2020   HDL 49 08/31/2020   CHOLHDL 4.0 08/31/2020   VLDL 18 08/31/2020   LDLCALC 128 (H) 08/31/2020   LDLCALC 113 (H) 05/24/2019   Lab Results  Component Value Date   TSH 1.315 08/31/2020   TSH 0.572 05/24/2019    Therapeutic Level Labs: No results found for: "LITHIUM" No results found for: "VALPROATE" No results found for: "CBMZ"  Current Medications: Current Outpatient Medications  Medication Sig Dispense Refill   ARIPiprazole (ABILIFY) 5 MG tablet Take 1  tablet (5 mg total) by mouth daily. 90 tablet 3   buPROPion (WELLBUTRIN XL) 150 MG 24 hr tablet TAKE 1 TABLET BY MOUTH EVERY DAY IN THE MORNING 90 tablet 3   diclofenac (VOLTAREN) 75 MG EC tablet Take 75 mg by mouth 2 (two) times daily.     fluconazole (DIFLUCAN) 200 MG tablet Take 1 tablet (200 mg total) by mouth every 3 (three)  days. (Patient not taking: Reported on 09/26/2022) 3 tablet 2   gabapentin (NEURONTIN) 300 MG capsule Take 1 capsule (300 mg total) by mouth 3 (three) times daily. 90 capsule 3   HYDROcodone-acetaminophen (NORCO) 10-325 MG tablet Take 1 tablet by mouth every 6 (six) hours as needed.     lamoTRIgine (LAMICTAL) 150 MG tablet Take 1 tablet (150 mg total) by mouth daily. For mood stabilization 30 tablet 3   Semaglutide-Weight Management (WEGOVY) 0.25 MG/0.5ML SOAJ Inject 0.25 mg into the skin once a week. 2 mL 0   Semaglutide-Weight Management (WEGOVY) 0.5 MG/0.5ML SOAJ Inject 0.5 mL (0.5 mg total) under the skin every 7 days. 2 mL 0   traZODone (DESYREL) 150 MG tablet TAKE 1 TABLET BY MOUTH EVERYDAY AT BEDTIME 90 tablet 1   Vitamin D, Ergocalciferol, (DRISDOL) 1.25 MG (50000 UNIT) CAPS capsule Take 50,000 Units by mouth every 7 (seven) days.     No current facility-administered medications for this visit.     Musculoskeletal: Strength & Muscle Tone: within normal limits and Telehealth visit Gait & Station: normal, Telehealth visit Patient leans: N/A  Psychiatric Specialty Exam: Review of Systems  There were no vitals taken for this visit.There is no height or weight on file to calculate BMI.  General Appearance: Well Groomed  Eye Contact:  Good  Speech:  Clear and Coherent and Normal Rate  Volume:  Normal  Mood:  Euthymic  Affect:  Appropriate and Congruent  Thought Process:  Coherent, Goal Directed, and NA  Orientation:  Full (Time, Place, and Person)  Thought Content: Logical and Hallucinations: Auditory      Suicidal Thoughts:  No  Homicidal Thoughts:  No   Memory:  Immediate;   Good Recent;   Good Remote;   Good  Judgement:  Good  Insight:  Good  Psychomotor Activity:  Normal  Concentration:  Concentration: Good and Attention Span: Good  Recall:  Good  Fund of Knowledge: Good  Language: Good  Akathisia:  No  Handed:  Right  AIMS (if indicated): not done  Assets:  Communication Skills Desire for Improvement Financial Resources/Insurance Housing Leisure Time Social Support  ADL's:  Intact  Cognition: WNL  Sleep:  Good   Screenings: GAD-7    Flowsheet Row Video Visit from 03/17/2023 in [facility] Video Visit from 01/11/2023 in [facility] Video Visit from 10/13/2022 in [facility] Video Visit from 08/04/2022 in [facility] Video Visit from 12/30/2021 in [facility]  Total GAD-7 Score 4 5 1 15 19       PHQ2-9    Flowsheet Row Video Visit from 03/17/2023 in [facility] Video Visit from 01/11/2023 in [facility] Video Visit from 10/13/2022 in [facility] Video Visit from 08/04/2022 in [facility] Video Visit from 12/30/2021 in [facility]  PHQ-2 Total Score 2 0 0 5 6  PHQ-9 Total Score 5 6 1 15 25       Flowsheet Row Video Visit from 10/13/2022 in [facility] ED from 09/24/2022 in [facility] Emergency Department at [facility] Video Visit from 08/04/2022 in [place]  C-SSRS RISK CATEGORY No Risk No Risk Error: Q7 should not be populated when Q6 is No        Assessment and Plan: Patient notes that her sleep, anxiety, and depression  are well managed. Overall patient notes that she is doing well. No medication changes made today.  She will continue her  medications as prescribed   1.  Bipolar 2 disorder, major depressive episode ([name])  Continue- ARIPiprazole (ABILIFY) 5 MG tablet; Take 1 tablet (5 mg total) by mouth daily.  Dispense: 90 tablet; Refill: 3 Continue- buPROPion (WELLBUTRIN XL) 150 MG 24 hr tablet; TAKE 1 TABLET BY MOUTH EVERY DAY IN THE MORNING  Dispense: 90 tablet; Refill: 3 Continue- gabapentin (NEURONTIN) 300 MG capsule; Take 1 capsule (300 mg total) by mouth 3 (three) times daily.  Dispense: 90 capsule; Refill: 3 Continue- lamoTRIgine (LAMICTAL) 150 MG tablet; Take 1 tablet (150 mg total) by mouth daily. For mood stabilization  Dispense: 30 tablet; Refill: 3 Continue- traZODone (DESYREL) 150 MG tablet; TAKE 1 TABLET BY MOUTH EVERYDAY AT BEDTIME  Dispense: 90 tablet; Refill: 1  2. Anxiety  Continue- gabapentin (NEURONTIN) 300 MG capsule; Take 1 capsule (300 mg total) by mouth 3 (three) times daily.  Dispense: 90 capsule; Refill: 3 Continue- traZODone (DESYREL) 150 MG tablet; TAKE 1 TABLET BY MOUTH EVERYDAY AT BEDTIME  Dispense: 90 tablet; Refill: 1  Follow up in 2 months Follow up with therapy  Shanna Cisco, NP 03/17/2023, 11:16 AM

## 2023-04-12 ENCOUNTER — Other Ambulatory Visit (HOSPITAL_COMMUNITY): Payer: Self-pay

## 2023-04-12 ENCOUNTER — Other Ambulatory Visit: Payer: Self-pay

## 2023-04-12 MED ORDER — WEGOVY 1 MG/0.5ML ~~LOC~~ SOAJ
1.0000 mg | SUBCUTANEOUS | 0 refills | Status: DC
  Filled 2023-04-12: qty 2, 28d supply, fill #0

## 2023-04-14 ENCOUNTER — Other Ambulatory Visit (HOSPITAL_COMMUNITY): Payer: Self-pay

## 2023-05-09 ENCOUNTER — Other Ambulatory Visit (HOSPITAL_COMMUNITY): Payer: Self-pay

## 2023-05-12 ENCOUNTER — Encounter (HOSPITAL_COMMUNITY): Payer: Self-pay | Admitting: Psychiatry

## 2023-05-12 ENCOUNTER — Telehealth (HOSPITAL_COMMUNITY): Payer: MEDICAID | Admitting: Psychiatry

## 2023-05-12 DIAGNOSIS — F419 Anxiety disorder, unspecified: Secondary | ICD-10-CM

## 2023-05-12 DIAGNOSIS — F3181 Bipolar II disorder: Secondary | ICD-10-CM

## 2023-05-12 MED ORDER — TRAZODONE HCL 150 MG PO TABS: ORAL_TABLET | ORAL | 1 refills | Status: DC

## 2023-05-12 MED ORDER — ARIPIPRAZOLE 10 MG PO TABS: 10.0000 mg | ORAL_TABLET | Freq: Every day | ORAL | 3 refills | Status: DC

## 2023-05-12 MED ORDER — BUPROPION HCL ER (XL) 150 MG PO TB24: ORAL_TABLET | ORAL | 3 refills | Status: DC

## 2023-05-12 MED ORDER — GABAPENTIN 300 MG PO CAPS: 300.0000 mg | ORAL_CAPSULE | Freq: Three times a day (TID) | ORAL | 3 refills | Status: DC

## 2023-05-12 MED ORDER — LAMOTRIGINE 150 MG PO TABS: 150.0000 mg | ORAL_TABLET | Freq: Every day | ORAL | 3 refills | Status: DC

## 2023-05-12 NOTE — Progress Notes (Signed)
BH MD/PA/NP OP Progress Note Virtual Visit via Video Note  I connected with Jade [name] on 05/12/23 at 11:00 AM EST by a video enabled telemedicine application and verified that I am speaking with the correct person using two identifiers.  Location: Patient: Home Provider: Clinic   I discussed the limitations of evaluation and management by telemedicine and the availability of in person appointments. The patient expressed understanding and agreed to proceed.  I provided 30 minutes of non-face-to-face time during this encounter.      05/12/2023 11:04 AM Jade [name]  MRN:  [id number]  Chief Complaint: "I woke up nauseous and irritable"  HPI: 48 year old female seen today for follow up psychiatry evaluation.  She has a psychiatric history of PTSD, Depression, Bipolar 2, Anxiety, and SI. She currently managed on Abilify 5 mg daily, Lamictal 150 mg daily, gabapentin 300 mg three  times daily, Wellbutrin XL 150 daily, and Trazodone 100 mg nightly as needed. She notes that her medications are somewhat effective in managing her psychiatric conditions.    Today she was well groomed, pleasant, cooperative, engaged in conversation, and maintained eye contact. She informed Clinical research associate that she has been nauseous and irritable. She notes that she is hopeful she is not coming down with some illness. Patient describes symptoms of hypomania today. She reports that she is has been distracted, has fluctuations in mood, racing thoughts, and impulsive spending. Patient repots that she sleeps 6 hours nightly and reports that her appetite is adequate. Today she denies SI/HI/VAH or paranoia.  Since her last visit she notes that her anxiety and depression has been somewhat increased. She notes that she is worried about her disability hearing. Today provider conducted a GAD 7 and patient scored a 12, at her last visit she scored a 4. Provider also conducted a PHQ 9 and patient scored a 10, at her lat visit she  scored a 5.   Patient note that she continues to have back pain which she quantifies as 7/10. She reports that gabapentin and hydrocodone are somewhat effective. She is followed by pain management.    Today Abilify 5 mg increased to 10 mg to help manage mood. No medication other changes made today.  She will continue her  medications as prescribed.  No other concerns at this time. Visit Diagnosis:    ICD-10-CM   1. Bipolar 2 disorder, major depressive episode ([name])  F31.81 ARIPiprazole (ABILIFY) 10 MG tablet    buPROPion (WELLBUTRIN XL) 150 MG 24 hr tablet    lamoTRIgine (LAMICTAL) 150 MG tablet    traZODone (DESYREL) 150 MG tablet    gabapentin (NEURONTIN) 300 MG capsule    2. Anxiety  F41.9 traZODone (DESYREL) 150 MG tablet    gabapentin (NEURONTIN) 300 MG capsule         Past Psychiatric History: PTSD, Depression, Bipolar 2, Anxiety, and SI  Past Medical History:  Past Medical History:  Diagnosis Date   Abnormal Pap smear of cervix    Anxiety    Depression    Vitamin D deficiency 04/2019    Past Surgical History:  Procedure Laterality Date   CERVICAL BIOPSY  W/ LOOP ELECTRODE EXCISION  03-2013   CESAREAN SECTION     x 1   COLPOSCOPY     TUBAL LIGATION      Family Psychiatric History:  Mom- depression, anxiety  Family History:  Family History  Problem Relation Age of Onset   Hypertension Mother    Diabetes Mother  Pancreatic cancer Maternal Grandmother    Diabetes Maternal Grandmother    Colon cancer Maternal Grandmother    Hyperlipidemia Brother     Social History:  Social History   Socioeconomic History   Marital status: Single    Spouse name: Not on file   Number of children: 2   Years of education: 14   Highest education level: Some college, no degree  Occupational History   Not on file  Tobacco Use   Smoking status: Never   Smokeless tobacco: Never  Vaping Use   Vaping status: Never Used  Substance and Sexual Activity   Alcohol use: No     Comment: socially    Drug use: No   Sexual activity: Not Currently    Birth control/protection: Surgical  Other Topics Concern   Not on file  Social History Narrative   Marital status: single; dating seriously x 7 years.      Children:  2 children (14, 6)      Lives: with two sons      Employment:  Conservation officer, nature at [employer]; first shift for 8 years.      Tobacco: none      Alcohol:  4-6 drinks per month      Drugs: none      Exercise:  Sporadic; twice weekly; walking      Seatbelt: 100%      Guns: none      Sexual activity:  Total partners = 6; history of Chlamydia in 2002; last STD screening 2016; males only   Social Drivers of Corporate investment banker Strain: Not on file  Food Insecurity: Low Risk  (12/12/2022)   Received from [facility]   Hunger Vital Sign    Worried About Running Out of Food in the Last Year: Never true    Ran Out of Food in the Last Year: Never true  Transportation Needs: No Transportation Needs (12/12/2022)   Received from [facility]    In the past 12 months, has lack of reliable transportation kept you from medical appointments, meetings, work or from getting things needed for daily living? : No  Physical Activity: Not on file  Stress: Not on file  Social Connections: Not on file    Allergies: No Known Allergies  Metabolic Disorder Labs: Lab Results  Component Value Date   HGBA1C 6.3 (H) 08/31/2020   MPG 134 08/31/2020   MPG 120 (H) 11/17/2014   No results found for: "PROLACTIN" Lab Results  Component Value Date   CHOL 195 08/31/2020   TRIG 92 08/31/2020   HDL 49 08/31/2020   CHOLHDL 4.0 08/31/2020   VLDL 18 08/31/2020   LDLCALC 128 (H) 08/31/2020   LDLCALC 113 (H) 05/24/2019   Lab Results  Component Value Date   TSH 1.315 08/31/2020   TSH 0.572 05/24/2019    Therapeutic Level Labs: No results found for: "LITHIUM" No results found for: "VALPROATE" No results found for: "CBMZ"  Current Medications: Current  Outpatient Medications  Medication Sig Dispense Refill   ARIPiprazole (ABILIFY) 10 MG tablet Take 1 tablet (10 mg total) by mouth daily. 30 tablet 3   buPROPion (WELLBUTRIN XL) 150 MG 24 hr tablet TAKE 1 TABLET BY MOUTH EVERY DAY IN THE MORNING 90 tablet 3   diclofenac (VOLTAREN) 75 MG EC tablet Take 75 mg by mouth 2 (two) times daily.     fluconazole (DIFLUCAN) 200 MG tablet Take 1 tablet (200 mg total) by mouth every 3 (  three) days. (Patient not taking: Reported on 09/26/2022) 3 tablet 2   gabapentin (NEURONTIN) 300 MG capsule Take 1 capsule (300 mg total) by mouth 3 (three) times daily. 90 capsule 3   HYDROcodone-acetaminophen (NORCO) 10-325 MG tablet Take 1 tablet by mouth every 6 (six) hours as needed.     lamoTRIgine (LAMICTAL) 150 MG tablet Take 1 tablet (150 mg total) by mouth daily. For mood stabilization 30 tablet 3   traZODone (DESYREL) 150 MG tablet TAKE 1 TABLET BY MOUTH EVERYDAY AT BEDTIME 90 tablet 1   Vitamin D, Ergocalciferol, (DRISDOL) 1.25 MG (50000 UNIT) CAPS capsule Take 50,000 Units by mouth every 7 (seven) days.     No current facility-administered medications for this visit.     Musculoskeletal: Strength & Muscle Tone: within normal limits and Telehealth visit Gait & Station: normal, Telehealth visit Patient leans: N/A  Psychiatric Specialty Exam: Review of Systems  There were no vitals taken for this visit.There is no height or weight on file to calculate BMI.  General Appearance: Well Groomed  Eye Contact:  Good  Speech:  Clear and Coherent and Normal Rate  Volume:  Normal  Mood:  Euthymic  Affect:  Appropriate and Congruent  Thought Process:  Coherent, Goal Directed, and NA  Orientation:  Full (Time, Place, and Person)  Thought Content: WDL and Logical   Suicidal Thoughts:  No  Homicidal Thoughts:  No  Memory:  Immediate;   Good Recent;   Good Remote;   Good  Judgement:  Good  Insight:  Good  Psychomotor Activity:  Normal  Concentration:   Concentration: Good and Attention Span: Good  Recall:  Good  Fund of Knowledge: Good  Language: Good  Akathisia:  No  Handed:  Right  AIMS (if indicated): not done  Assets:  Communication Skills Desire for Improvement Financial Resources/Insurance Housing Leisure Time Social Support  ADL's:  Intact  Cognition: WNL  Sleep:  Good   Screenings: GAD-7    Flowsheet Row Video Visit from 05/12/2023 in [facility] Video Visit from 03/17/2023 in [facility] Video Visit from 01/11/2023 in [facility] Video Visit from 10/13/2022 in [facility] Video Visit from 08/04/2022 in [facility]  Total GAD-7 Score 12 4 5 1 15       PHQ2-9    Flowsheet Row Video Visit from 05/12/2023 in [facility] Video Visit from 03/17/2023 in [name] Video Visit from 01/11/2023 in [facility] Video Visit from 10/13/2022 in [facility] Video Visit from 08/04/2022 in [facility]  PHQ-2 Total Score 5 2 0 0 5  PHQ-9 Total Score 10 5 6 1 15       Flowsheet Row Video Visit from 10/13/2022 in [facility] ED from 09/24/2022 in [facility] Emergency Department at [facility] Video Visit from 08/04/2022 in [facility]  C-SSRS RISK CATEGORY No Risk No Risk Error: Q7 should not be populated when Q6 is No        Assessment and Plan: Patient endorses symptoms of hypomania, anxiety, and depression. Today Abilify 5 mg increased to 10 mg to help manage mood. No medication other changes made today.  She will continue her  medications as prescribed.   1. Bipolar 2 disorder, major depressive episode ([name])  Increased- ARIPiprazole (ABILIFY) 10 MG tablet; Take 1  tablet (10 mg total) by  mouth daily.  Dispense: 30 tablet; Refill: 3 Continue- buPROPion (WELLBUTRIN XL) 150 MG 24 hr tablet; TAKE 1 TABLET BY MOUTH EVERY DAY IN THE MORNING  Dispense: 90 tablet; Refill: 3 Continue- lamoTRIgine (LAMICTAL) 150 MG tablet; Take 1 tablet (150 mg total) by mouth daily. For mood stabilization  Dispense: 30 tablet; Refill: 3 Continue- traZODone (DESYREL) 150 MG tablet; TAKE 1 TABLET BY MOUTH EVERYDAY AT BEDTIME  Dispense: 90 tablet; Refill: 1 Continue- gabapentin (NEURONTIN) 300 MG capsule; Take 1 capsule (300 mg total) by mouth 3 (three) times daily.  Dispense: 90 capsule; Refill: 3  2. Anxiety  Continue- traZODone (DESYREL) 150 MG tablet; TAKE 1 TABLET BY MOUTH EVERYDAY AT BEDTIME  Dispense: 90 tablet; Refill: 1 Continue- gabapentin (NEURONTIN) 300 MG capsule; Take 1 capsule (300 mg total) by mouth 3 (three) times daily.  Dispense: 90 capsule; Refill: 3   Follow up in 3 months Follow up with therapy  Shanna Cisco, NP 05/12/2023, 11:04 AM

## 2023-05-18 ENCOUNTER — Telehealth (HOSPITAL_COMMUNITY): Payer: Self-pay | Admitting: *Deleted

## 2023-05-18 NOTE — Telephone Encounter (Signed)
 PA submitted for patients aripiprazole and it has been approved thru [employer] till 05/17/2024. Pharmacy notified it has been approved for one year.

## 2023-06-07 ENCOUNTER — Other Ambulatory Visit (HOSPITAL_COMMUNITY): Payer: Self-pay

## 2023-06-07 MED ORDER — WEGOVY 2.4 MG/0.75ML ~~LOC~~ SOAJ
2.4000 mg | SUBCUTANEOUS | 2 refills | Status: DC
  Filled 2023-06-07: qty 3, 28d supply, fill #0
  Filled 2023-07-06: qty 3, 28d supply, fill #1
  Filled 2023-08-05: qty 3, 28d supply, fill #2

## 2023-06-10 ENCOUNTER — Other Ambulatory Visit (HOSPITAL_COMMUNITY): Payer: Self-pay | Admitting: Psychiatry

## 2023-06-10 DIAGNOSIS — F3181 Bipolar II disorder: Secondary | ICD-10-CM

## 2023-08-04 ENCOUNTER — Encounter (HOSPITAL_COMMUNITY): Payer: Self-pay | Admitting: Psychiatry

## 2023-08-04 ENCOUNTER — Telehealth (INDEPENDENT_AMBULATORY_CARE_PROVIDER_SITE_OTHER): Payer: MEDICAID | Admitting: Psychiatry

## 2023-08-04 DIAGNOSIS — F3181 Bipolar II disorder: Secondary | ICD-10-CM | POA: Diagnosis not present

## 2023-08-04 DIAGNOSIS — F419 Anxiety disorder, unspecified: Secondary | ICD-10-CM | POA: Diagnosis not present

## 2023-08-04 MED ORDER — BUPROPION HCL ER (XL) 150 MG PO TB24: ORAL_TABLET | ORAL | 3 refills | Status: DC

## 2023-08-04 MED ORDER — LAMOTRIGINE 150 MG PO TABS: 150.0000 mg | ORAL_TABLET | Freq: Every day | ORAL | 3 refills | Status: DC

## 2023-08-04 MED ORDER — GABAPENTIN 300 MG PO CAPS: 300.0000 mg | ORAL_CAPSULE | Freq: Three times a day (TID) | ORAL | 3 refills | Status: DC

## 2023-08-04 MED ORDER — TRAZODONE HCL 150 MG PO TABS: ORAL_TABLET | ORAL | 1 refills | Status: DC

## 2023-08-04 MED ORDER — ARIPIPRAZOLE 10 MG PO TABS: 10.0000 mg | ORAL_TABLET | Freq: Every day | ORAL | 2 refills | Status: DC

## 2023-08-04 NOTE — Progress Notes (Signed)
 BH MD/PA/NP OP Progress Note Virtual Visit via Video Note  I connected with Jade [name] on 08/04/23 at 10:00 AM EDT by a video enabled telemedicine application and verified that I am speaking with the correct person using two identifiers.  Location: Patient: Home Provider: Clinic   I discussed the limitations of evaluation and management by telemedicine and the availability of in person appointments. The patient expressed understanding and agreed to proceed.  I provided 30 minutes of non-face-to-face time during this encounter.      08/04/2023 10:14 AM Jade [name]  MRN:  [id number]  Chief Complaint: "Things are the same"  HPI: 48 year old female seen today for follow up psychiatry evaluation.  She has a psychiatric history of PTSD, Depression, Bipolar 2, Anxiety, and SI. She currently managed on Abilify  10 mg daily, Lamictal  150 mg daily, gabapentin  300 mg three  times daily, Wellbutrin  XL 150 daily, and Trazodone  150 mg nightly as needed. She notes that her medications are effective in managing her psychiatric conditions.    Today she was well groomed, pleasant, cooperative, engaged in conversation, and maintained eye contact. She informed Clinical research associate that things are the same. She notes that she recovered from her weigh loss surgery. She notes that she went from weighing over 260 lbs to 175 lb. She is intentional about her diet.She notes that she does not feel as heavy and her back pain is improving.  Mentally patient notes that she is doing well. She reports that her mood, anxiety, and depression are well managed.  Today provider conducted a GAD 7 and patient scored a 7, at her last visit she scored a 12. Provider also conducted a PHQ 9 and patient scored a 6, at her lat visit she scored a 10. She notes that she sleeps 5 hours but informed writer its because she has been taking a partial dose of trazodone . She notes that she sleeps 5 hours on 100 mg of trazodone . Patient is prescribed 150  mg. She reports that her appetite is adequate. Today she denies SI/HI/VAH, mania, or paranoia.  Patient notes that her son will be attending prom soon.  She notes that she is excited about in reaching this milestone.    No medication changes made today.  Patient agreeable to continue medication as prescribed. No other concerns at this time. Visit Diagnosis:    ICD-10-CM   1. Bipolar 2 disorder, major depressive episode ([name])  F31.81 traZODone  (DESYREL ) 150 MG tablet    buPROPion  (WELLBUTRIN  XL) 150 MG 24 hr tablet    ARIPiprazole  (ABILIFY ) 10 MG tablet    lamoTRIgine  (LAMICTAL ) 150 MG tablet    gabapentin  (NEURONTIN ) 300 MG capsule    2. Anxiety  F41.9 traZODone  (DESYREL ) 150 MG tablet    gabapentin  (NEURONTIN ) 300 MG capsule          Past Psychiatric History: PTSD, Depression, Bipolar 2, Anxiety, and SI  Past Medical History:  Past Medical History:  Diagnosis Date   Abnormal Pap smear of cervix    Anxiety    Depression    Vitamin D  deficiency 04/2019    Past Surgical History:  Procedure Laterality Date   CERVICAL BIOPSY  W/ LOOP ELECTRODE EXCISION  03-2013   CESAREAN SECTION     x 1   COLPOSCOPY     TUBAL LIGATION      Family Psychiatric History:  Mom- depression, anxiety  Family History:  Family History  Problem Relation Age of Onset   Hypertension Mother  Diabetes Mother    Pancreatic cancer Maternal Grandmother    Diabetes Maternal Grandmother    Colon cancer Maternal Grandmother    Hyperlipidemia Brother     Social History:  Social History   Socioeconomic History   Marital status: Single    Spouse name: Not on file   Number of children: 2   Years of education: 14   Highest education level: Some college, no degree  Occupational History   Not on file  Tobacco Use   Smoking status: Never   Smokeless tobacco: Never  Vaping Use   Vaping status: Never Used  Substance and Sexual Activity   Alcohol use: No    Comment: socially    Drug use: No    Sexual activity: Not Currently    Birth control/protection: Surgical  Other Topics Concern   Not on file  Social History Narrative   Marital status: single; dating seriously x 7 years.      Children:  2 children (14, 6)      Lives: with two sons      Employment:  Conservation officer, nature at [employer]; first shift for 8 years.      Tobacco: none      Alcohol:  4-6 drinks per month      Drugs: none      Exercise:  Sporadic; twice weekly; walking      Seatbelt: 100%      Guns: none      Sexual activity:  Total partners = 6; history of Chlamydia in 2002; last STD screening 2016; males only   Social Drivers of Corporate investment banker Strain: Not on file  Food Insecurity: Low Risk  (12/12/2022)   Received from [facility]   Hunger Vital Sign    Worried About Running Out of Food in the Last Year: Never true    Ran Out of Food in the Last Year: Never true  Transportation Needs: No Transportation Needs (12/12/2022)   Received from [facility]    In the past 12 months, has lack of reliable transportation kept you from medical appointments, meetings, work or from getting things needed for daily living? : No  Physical Activity: Not on file  Stress: Not on file  Social Connections: Not on file    Allergies: No Known Allergies  Metabolic Disorder Labs: Lab Results  Component Value Date   HGBA1C 6.3 (H) 08/31/2020   MPG 134 08/31/2020   MPG 120 (H) 11/17/2014   No results found for: "PROLACTIN" Lab Results  Component Value Date   CHOL 195 08/31/2020   TRIG 92 08/31/2020   HDL 49 08/31/2020   CHOLHDL 4.0 08/31/2020   VLDL 18 08/31/2020   LDLCALC 128 (H) 08/31/2020   LDLCALC 113 (H) 05/24/2019   Lab Results  Component Value Date   TSH 1.315 08/31/2020   TSH 0.572 05/24/2019    Therapeutic Level Labs: No results found for: "LITHIUM" No results found for: "VALPROATE" No results found for: "CBMZ"  Current Medications: Current Outpatient Medications  Medication Sig  Dispense Refill   ARIPiprazole  (ABILIFY ) 10 MG tablet Take 1 tablet (10 mg total) by mouth daily. 90 tablet 2   buPROPion  (WELLBUTRIN  XL) 150 MG 24 hr tablet TAKE 1 TABLET BY MOUTH EVERY DAY IN THE MORNING 90 tablet 3   diclofenac (VOLTAREN) 75 MG EC tablet Take 75 mg by mouth 2 (two) times daily.     fluconazole  (DIFLUCAN ) 200 MG tablet Take 1 tablet (200 mg  total) by mouth every 3 (three) days. (Patient not taking: Reported on 09/26/2022) 3 tablet 2   gabapentin  (NEURONTIN ) 300 MG capsule Take 1 capsule (300 mg total) by mouth 3 (three) times daily. 90 capsule 3   HYDROcodone-acetaminophen  (NORCO) 10-325 MG tablet Take 1 tablet by mouth every 6 (six) hours as needed.     lamoTRIgine  (LAMICTAL ) 150 MG tablet Take 1 tablet (150 mg total) by mouth daily. For mood stabilization 30 tablet 3   Semaglutide -Weight Management (WEGOVY ) 2.4 MG/0.75ML SOAJ Inject 2.4 mg into the skin once a week. 3 mL 2   traZODone  (DESYREL ) 150 MG tablet TAKE 1 TABLET BY MOUTH EVERYDAY AT BEDTIME 90 tablet 1   Vitamin D , Ergocalciferol , (DRISDOL ) 1.25 MG (50000 UNIT) CAPS capsule Take 50,000 Units by mouth every 7 (seven) days.     No current facility-administered medications for this visit.     Musculoskeletal: Strength & Muscle Tone: within normal limits and Telehealth visit Gait & Station: normal, Telehealth visit Patient leans: N/A  Psychiatric Specialty Exam: Review of Systems  There were no vitals taken for this visit.There is no height or weight on file to calculate BMI.  General Appearance: Well Groomed  Eye Contact:  Good  Speech:  Clear and Coherent and Normal Rate  Volume:  Normal  Mood:  Euthymic  Affect:  Appropriate and Congruent  Thought Process:  Coherent, Goal Directed, and NA  Orientation:  Full (Time, Place, and Person)  Thought Content: WDL and Logical   Suicidal Thoughts:  No  Homicidal Thoughts:  No  Memory:  Immediate;   Good Recent;   Good Remote;   Good  Judgement:  Good  Insight:   Good  Psychomotor Activity:  Normal  Concentration:  Concentration: Good and Attention Span: Good  Recall:  Good  Fund of Knowledge: Good  Language: Good  Akathisia:  No  Handed:  Right  AIMS (if indicated): not done  Assets:  Communication Skills Desire for Improvement Financial Resources/Insurance Housing Leisure Time Social Support  ADL's:  Intact  Cognition: WNL  Sleep:  Fair   Screenings: GAD-7    Flowsheet Row Video Visit from 08/04/2023 in [facility] Video Visit from 05/12/2023 in [facility] Video Visit from 03/17/2023 in [facility] Video Visit from 01/11/2023 in [facility] Video Visit from 10/13/2022 in [facility]  Total GAD-7 Score 7 12 4 5 1       PHQ2-9    Flowsheet Row Video Visit from 08/04/2023 in [facility] Video Visit from 05/12/2023 in [facility] Video Visit from 03/17/2023 in [facility] Video Visit from 01/11/2023 in [facility] Video Visit from 10/13/2022 in [facility]  PHQ-2 Total Score 1 5 2  0 0  PHQ-9 Total Score 6 10 5 6 1       Flowsheet Row Video Visit from 10/13/2022 in [facility] ED from 09/24/2022 in [facility] Emergency Department at [facility] Video Visit from 08/04/2022 in [facility]  C-SSRS RISK CATEGORY No Risk No Risk Error: Q7 should not be populated when Q6 is No        Assessment and Plan: Patient reports that her mood, anxiety, and depression has improved since her last visit.  She continues to have.  Is of poor sleep but notes that she has  been taking a partial dose of trazodone .  She plans to restart her prescribed dose of 150 mg.No medication changes made today.  Patient agreeable to  continue medication as prescribed.   1. Bipolar 2 disorder, major depressive episode ([name])  Continue- traZODone  (DESYREL ) 150 MG tablet; TAKE 1 TABLET BY MOUTH EVERYDAY AT BEDTIME  Dispense: 90 tablet; Refill: 1 Continue- buPROPion  (WELLBUTRIN  XL) 150 MG 24 hr tablet; TAKE 1 TABLET BY MOUTH EVERY DAY IN THE MORNING  Dispense: 90 tablet; Refill: 3 Continue- ARIPiprazole  (ABILIFY ) 10 MG tablet; Take 1 tablet (10 mg total) by mouth daily.  Dispense: 90 tablet; Refill: 2 Continue- lamoTRIgine  (LAMICTAL ) 150 MG tablet; Take 1 tablet (150 mg total) by mouth daily. For mood stabilization  Dispense: 30 tablet; Refill: 3 Continue- gabapentin  (NEURONTIN ) 300 MG capsule; Take 1 capsule (300 mg total) by mouth 3 (three) times daily.  Dispense: 90 capsule; Refill: 3  2. Anxiety  Continue- traZODone  (DESYREL ) 150 MG tablet; TAKE 1 TABLET BY MOUTH EVERYDAY AT BEDTIME  Dispense: 90 tablet; Refill: 1 Continue- gabapentin  (NEURONTIN ) 300 MG capsule; Take 1 capsule (300 mg total) by mouth 3 (three) times daily.  Dispense: 90 capsule; Refill: 3  Follow up in 3 months Follow up with therapy  Arlyne Bering, NP 08/04/2023, 10:14 AM

## 2023-08-07 ENCOUNTER — Other Ambulatory Visit (HOSPITAL_COMMUNITY): Payer: Self-pay

## 2023-08-11 ENCOUNTER — Telehealth (HOSPITAL_COMMUNITY): Payer: Self-pay | Admitting: Psychiatry

## 2023-08-14 NOTE — Telephone Encounter (Signed)
 Provider given permission by patient to speak to [employer] and life insurance.  Per insurance representative patient has been continuously partially disabled on all forms that were filled out by Clinical research associate.  Provider informed patient and well as [facility] staff that this documentation was true.  Patient does have a history of bipolar disorder and anxiety.  She has flareups but does have moments where she is able to work.  [facility] staff reports that they will send me another documentation to fill out as they don't provide coverage over a year.  Provider informed patient and [facility] that these form will be filled pending that provider has not left on leave.  They endorsed understanding and agreement.

## 2023-08-24 ENCOUNTER — Telehealth (HOSPITAL_COMMUNITY): Payer: Self-pay | Admitting: Psychiatry

## 2023-08-25 NOTE — Telephone Encounter (Signed)
 Patient asked writer if she received a another insurance claim form.  Provider informed patient that she had not.  Provider informed patient that her insurance company did call and inform her that her claim may not be approved as a claim has been open for an extended amount of times.  Patient notes that her claim should not expire until 2026.  Provider endorsed understanding.  No other concerns at this time.

## 2023-08-31 ENCOUNTER — Other Ambulatory Visit (HOSPITAL_COMMUNITY): Payer: Self-pay

## 2023-08-31 MED ORDER — WEGOVY 2.4 MG/0.75ML ~~LOC~~ SOAJ
2.4000 mg | SUBCUTANEOUS | 2 refills | Status: DC
  Filled 2023-08-31: qty 3, 28d supply, fill #0
  Filled 2023-10-13: qty 3, 28d supply, fill #1
  Filled 2023-11-13: qty 3, 28d supply, fill #2

## 2023-09-13 ENCOUNTER — Other Ambulatory Visit (HOSPITAL_COMMUNITY): Payer: Self-pay

## 2023-09-14 ENCOUNTER — Other Ambulatory Visit (HOSPITAL_COMMUNITY): Payer: Self-pay

## 2023-10-30 ENCOUNTER — Telehealth (HOSPITAL_COMMUNITY): Payer: MEDICAID | Admitting: Psychiatry

## 2023-11-02 ENCOUNTER — Telehealth (HOSPITAL_COMMUNITY): Payer: MEDICAID | Admitting: Family

## 2023-11-02 ENCOUNTER — Encounter (HOSPITAL_COMMUNITY): Payer: Self-pay | Admitting: Family

## 2023-11-02 DIAGNOSIS — F23 Brief psychotic disorder: Secondary | ICD-10-CM

## 2023-11-02 DIAGNOSIS — F3181 Bipolar II disorder: Secondary | ICD-10-CM

## 2023-11-02 DIAGNOSIS — F419 Anxiety disorder, unspecified: Secondary | ICD-10-CM

## 2023-11-02 MED ORDER — LAMOTRIGINE 150 MG PO TABS: 150.0000 mg | ORAL_TABLET | Freq: Every day | ORAL | 3 refills | Status: DC

## 2023-11-02 MED ORDER — TRAZODONE HCL 150 MG PO TABS: ORAL_TABLET | ORAL | 1 refills | Status: DC

## 2023-11-02 MED ORDER — BUPROPION HCL ER (XL) 150 MG PO TB24: ORAL_TABLET | ORAL | 3 refills | Status: DC

## 2023-11-02 MED ORDER — ARIPIPRAZOLE 15 MG PO TABS: 15.0000 mg | ORAL_TABLET | Freq: Every day | ORAL | 3 refills | Status: DC

## 2023-11-02 NOTE — Progress Notes (Signed)
 BH MD/PA/NP OP Progress Note Virtual Visit via Video Note  I connected with Jade [name] on 11/02/23 at  9:00 AM EDT by a video enabled telemedicine application and verified that I am speaking with the correct person using two identifiers.  Location: Patient: Home Provider: Clinic   I discussed the limitations of evaluation and management by telemedicine and the availability of in person appointments. The patient expressed understanding and agreed to proceed.  I provided 30 minutes of non-face-to-face time during this encounter.  11/02/2023 4:02 PM Jade [name]  MRN:  [id number]  Chief Complaint:   HPI: 48 year old female seen today for follow up psychiatry evaluation.  She has a psychiatric history of PTSD, Depression, Bipolar 2, Anxiety, and SI. She currently managed on Abilify  10 mg daily, Lamictal  150 mg daily, gabapentin  300 mg three  times daily, Wellbutrin  XL 150 daily, and Trazodone  150 mg nightly as needed. She notes that her medications are effective in managing her psychiatric conditions.    The patient was seen today via virtual psychiatric follow-up while at home. She reports not resting well last night and feeling unwell this morning, but states that there have been no major changes overall. She reports taking care of herself and describes her mental health as "stable," but expresses concern that she may be declining.  Upon further assessment, she reports an increase in self-directed speech, describing full conversations with herself, such as, "I asked myself a question. I answered the question. I talk as if I am in the room and there are people in the room with me." She states that this has been occurring for months but has only recently become concerning to her. She was apologetic for not disclosing these symptoms to her previous provider, Jade [name].  When assessing for paranoia or psychosis, the patient reports avoiding people, preferring to shop early in the  morning to avoid crowds, stating, "I feel like people are looking at me. I just have to get out of there." She also reports hearing her children calling her name while home alone.  Her appetite is described as "eating as much as I can," though she is currently taking Wegovy  and underwent gastric sleeve surgery in 2024. She reports a total weight loss of approximately 94 pounds. She follows up with [facility] in a couple of weeks, where vitamin levels will be rechecked. Review of her most recent labs shows a B12 level of 1,460 and vitamin B1 level of 123. She has a family history of schizophrenia in a maternal uncle.  She is currently unemployed and applying for Counsellor ([employer]). She denies suicidal ideation, homicidal ideation, and other auditory or visual hallucinations outside of what was described above. She does not appear to be responding to internal or external stimuli during the visit and does not display overt signs of a delusional thought disorder. Her responses were linear and appropriate, and she was able to reciprocate a full conversation.  We discussed possible etiologies and the importance of ruling out organic causes of psychosis. The patient is agreeable to obtaining a CT head scan and having hormone levels and vitamin D  checked at her next outpatient visit.  No medication changes made today.  Patient agreeable to continue medication as prescribed. No other concerns at this time. Visit Diagnosis:    ICD-10-CM   1. Bipolar 2 disorder, major depressive episode ([name])  F31.81     2. Anxiety  F41.9  Past Psychiatric History: PTSD, Depression, Bipolar 2, Anxiety, and SI  Past Medical History:  Past Medical History:  Diagnosis Date   Abnormal Pap smear of cervix    Anxiety    Depression    Vitamin D  deficiency 04/2019    Past Surgical History:  Procedure Laterality Date   CERVICAL BIOPSY  W/ LOOP ELECTRODE EXCISION   03-2013   CESAREAN SECTION     x 1   COLPOSCOPY     TUBAL LIGATION      Family Psychiatric History:  Mom- depression, anxiety  Family History:  Family History  Problem Relation Age of Onset   Hypertension Mother    Diabetes Mother    Pancreatic cancer Maternal Grandmother    Diabetes Maternal Grandmother    Colon cancer Maternal Grandmother    Hyperlipidemia Brother     Social History:  Social History   Socioeconomic History   Marital status: Single    Spouse name: Not on file   Number of children: 2   Years of education: 14   Highest education level: Some college, no degree  Occupational History   Not on file  Tobacco Use   Smoking status: Never   Smokeless tobacco: Never  Vaping Use   Vaping status: Never Used  Substance and Sexual Activity   Alcohol use: No    Comment: socially    Drug use: No   Sexual activity: Not Currently    Birth control/protection: Surgical  Other Topics Concern   Not on file  Social History Narrative   Marital status: single; dating seriously x 7 years.      Children:  2 children (14, 6)      Lives: with two sons      Employment:  Conservation officer, nature at [employer]; first shift for 8 years.      Tobacco: none      Alcohol:  4-6 drinks per month      Drugs: none      Exercise:  Sporadic; twice weekly; walking      Seatbelt: 100%      Guns: none      Sexual activity:  Total partners = 6; history of Chlamydia in 2002; last STD screening 2016; males only   Social Drivers of Corporate investment banker Strain: Not on file  Food Insecurity: Low Risk  (12/12/2022)   Received from [facility]   Hunger Vital Sign    Within the past 12 months, you worried that your food would run out before you got money to buy more: Never true    Within the past 12 months, the food you bought just didn't last and you didn't have money to get more. : Never true  Transportation Needs: No Transportation Needs (12/12/2022)   Received from [facility]     In the past 12 months, has lack of reliable transportation kept you from medical appointments, meetings, work or from getting things needed for daily living? : No  Physical Activity: Not on file  Stress: Not on file  Social Connections: Not on file    Allergies: No Known Allergies  Metabolic Disorder Labs: Lab Results  Component Value Date   HGBA1C 6.3 (H) 08/31/2020   MPG 134 08/31/2020   MPG 120 (H) 11/17/2014   No results found for: PROLACTIN Lab Results  Component Value Date   CHOL 195 08/31/2020   TRIG 92 08/31/2020   HDL 49 08/31/2020   CHOLHDL 4.0 08/31/2020   VLDL  18 08/31/2020   LDLCALC 128 (H) 08/31/2020   LDLCALC 113 (H) 05/24/2019   Lab Results  Component Value Date   TSH 1.315 08/31/2020   TSH 0.572 05/24/2019    Therapeutic Level Labs: No results found for: LITHIUM No results found for: VALPROATE No results found for: CBMZ  Current Medications: Current Outpatient Medications  Medication Sig Dispense Refill   ARIPiprazole  (ABILIFY ) 10 MG tablet Take 1 tablet (10 mg total) by mouth daily. 90 tablet 2   buPROPion  (WELLBUTRIN  XL) 150 MG 24 hr tablet TAKE 1 TABLET BY MOUTH EVERY DAY IN THE MORNING 90 tablet 3   diclofenac (VOLTAREN) 75 MG EC tablet Take 75 mg by mouth 2 (two) times daily.     fluconazole  (DIFLUCAN ) 200 MG tablet Take 1 tablet (200 mg total) by mouth every 3 (three) days. (Patient not taking: Reported on 09/26/2022) 3 tablet 2   gabapentin  (NEURONTIN ) 300 MG capsule Take 1 capsule (300 mg total) by mouth 3 (three) times daily. 90 capsule 3   HYDROcodone-acetaminophen  (NORCO) 10-325 MG tablet Take 1 tablet by mouth every 6 (six) hours as needed.     lamoTRIgine  (LAMICTAL ) 150 MG tablet Take 1 tablet (150 mg total) by mouth daily. For mood stabilization 30 tablet 3   Semaglutide -Weight Management (WEGOVY ) 2.4 MG/0.75ML SOAJ Inject 2.4 mg into the skin once a week. 3 mL 2   traZODone  (DESYREL ) 150 MG tablet TAKE 1 TABLET BY MOUTH  EVERYDAY AT BEDTIME 90 tablet 1   Vitamin D , Ergocalciferol , (DRISDOL ) 1.25 MG (50000 UNIT) CAPS capsule Take 50,000 Units by mouth every 7 (seven) days.     No current facility-administered medications for this visit.     Musculoskeletal: Strength & Muscle Tone: within normal limits and Telehealth visit Gait & Station: normal, Telehealth visit Patient leans: N/A  Psychiatric Specialty Exam: Review of Systems  There were no vitals taken for this visit.There is no height or weight on file to calculate BMI.  General Appearance: Well Groomed  Eye Contact:  Good  Speech:  Clear and Coherent and Normal Rate  Volume:  Normal  Mood:  Euthymic  Affect:  Appropriate and Congruent  Thought Process:  Coherent, Goal Directed, and NA  Orientation:  Full (Time, Place, and Person)  Thought Content: WDL and Logical   Suicidal Thoughts:  No  Homicidal Thoughts:  No  Memory:  Immediate;   Good Recent;   Good Remote;   Good  Judgement:  Good  Insight:  Good  Psychomotor Activity:  Normal  Concentration:  Concentration: Good and Attention Span: Good  Recall:  Good  Fund of Knowledge: Good  Language: Good  Akathisia:  No  Handed:  Right  AIMS (if indicated): not done  Assets:  Communication Skills Desire for Improvement Financial Resources/Insurance Housing Leisure Time Social Support  ADL's:  Intact  Cognition: WNL  Sleep:  Fair   Screenings: GAD-7    Flowsheet Row Video Visit from 08/04/2023 in [facility] Video Visit from 05/12/2023 in [facility] Video Visit from 03/17/2023 in [facility] Video Visit from 01/11/2023 in [facility] Video Visit from 10/13/2022 in [facility]  Total GAD-7 Score 7 12 4 5 1    PHQ2-9    Flowsheet Row Video Visit from 08/04/2023 in [facility] Video Visit from 05/12/2023 in [facility] Video Visit from 03/17/2023 [facility] Video Visit from 01/11/2023 in [facility] Video Visit from 10/13/2022 in [facility]  PHQ-2 Total Score 1 5 2  0 0  PHQ-9 Total Score 6 10 5 6 1    Flowsheet Row Video Visit from 10/13/2022 in [facility] ED from 09/24/2022 in [facility] Emergency Department at [facility] Video Visit from 08/04/2022 in [facility]  C-SSRS RISK CATEGORY No Risk No Risk Error: Q7 should not be populated when Q6 is No     Assessment and Plan: Patient reports that her mood, anxiety, and depression has improved since her last visit.  She endorses new onset of psychosis. Patient agreeable to taking medications.    1. Bipolar 2 disorder, major depressive episode ([name])  Continue- traZODone  (DESYREL ) 150 MG tablet; TAKE 1 TABLET BY MOUTH EVERYDAY AT BEDTIME  Dispense: 90 tablet; Refill: 1 Continue- buPROPion  (WELLBUTRIN  XL) 150 MG 24 hr tablet; TAKE 1 TABLET BY MOUTH EVERY DAY IN THE MORNING  Dispense: 90 tablet; Refill: 3 Continue- ARIPiprazole  (ABILIFY ) 10 MG tablet; Take 1 tablet (10 mg total) by mouth daily.  Dispense: 90 tablet; Refill: 2 Continue- lamoTRIgine  (LAMICTAL ) 150 MG tablet; Take 1 tablet (150 mg total) by mouth daily. For mood stabilization  Dispense: 30 tablet; Refill: 3 Continue- gabapentin  (NEURONTIN ) 300 MG capsule; Take 1 capsule (300 mg total) by mouth 3 (three) times daily.  Dispense: 90 capsule; Refill: 3  2. Anxiety  Continue- traZODone  (DESYREL ) 150 MG tablet; TAKE 1 TABLET BY MOUTH EVERYDAY AT BEDTIME  Dispense: 90 tablet; Refill: 1 Continue- gabapentin  (NEURONTIN ) 300 MG capsule; Take 1 capsule (300 mg total) by mouth 3 (three) times daily.  Dispense: 90 capsule; Refill: 3  Follow up in 3 months Follow up with therapy  Majel GORMAN Ramp, FNP 11/02/2023, 4:02  PM

## 2023-11-03 ENCOUNTER — Encounter (HOSPITAL_COMMUNITY): Payer: Self-pay | Admitting: Family

## 2023-11-08 ENCOUNTER — Other Ambulatory Visit: Payer: Self-pay

## 2023-12-15 ENCOUNTER — Telehealth (INDEPENDENT_AMBULATORY_CARE_PROVIDER_SITE_OTHER): Payer: MEDICAID | Admitting: Psychiatry

## 2023-12-15 ENCOUNTER — Encounter (HOSPITAL_COMMUNITY): Payer: Self-pay | Admitting: Psychiatry

## 2023-12-15 DIAGNOSIS — F419 Anxiety disorder, unspecified: Secondary | ICD-10-CM

## 2023-12-15 DIAGNOSIS — F3181 Bipolar II disorder: Secondary | ICD-10-CM | POA: Diagnosis not present

## 2023-12-15 MED ORDER — BUPROPION HCL ER (XL) 150 MG PO TB24: ORAL_TABLET | ORAL | 3 refills | Status: DC

## 2023-12-15 MED ORDER — ARIPIPRAZOLE 20 MG PO TABS: 20.0000 mg | ORAL_TABLET | Freq: Every day | ORAL | 3 refills | Status: DC

## 2023-12-15 MED ORDER — TRAZODONE HCL 150 MG PO TABS: ORAL_TABLET | ORAL | 1 refills | Status: DC

## 2023-12-15 MED ORDER — LAMOTRIGINE 150 MG PO TABS: 150.0000 mg | ORAL_TABLET | Freq: Every day | ORAL | 3 refills | Status: DC

## 2023-12-15 MED ORDER — GABAPENTIN 300 MG PO CAPS: 300.0000 mg | ORAL_CAPSULE | Freq: Three times a day (TID) | ORAL | 3 refills | Status: DC

## 2023-12-15 NOTE — Progress Notes (Signed)
 BH MD/PA/NP OP Progress Note Virtual Visit via Video Note  I connected with Jade [name] on 12/15/23 at  9:30 AM EDT by a video enabled telemedicine application and verified that I am speaking with the correct person using two identifiers.  Location: Patient: Home Provider: Clinic   I discussed the limitations of evaluation and management by telemedicine and the availability of in person appointments. The patient expressed understanding and agreed to proceed.  I provided 30 minutes of non-face-to-face time during this encounter.      12/15/2023 8:30 AM Jade [name]  MRN:  [id number]  Chief Complaint: I have been hearing thing  HPI: 48 year old female seen today for follow up psychiatry evaluation.  She has a psychiatric history of PTSD, Depression, Bipolar 2, Anxiety, and SI. She currently managed on Abilify  15 mg daily, Lamictal  150 mg daily, gabapentin  300 mg three  times daily, Wellbutrin  XL 150 daily, and Trazodone  150 mg nightly as needed. She notes that her medications are somewhat effective in managing her psychiatric conditions.    Today she was well groomed, pleasant, cooperative, engaged in conversation, and maintained eye contact. She informed Clinical research associate that she has been hearing things for over 6 months. She also notes that she sees people. She describes it as coming as coming into a crowded room of familiar people. She denies alcohol or illegal drug use. Patient notes that her sleep fluctuates. She notes that some nights she sleeps 3-8 hours. She also notes that she has lost 10 more pounds since her last visit.  Patient informed Clinical research associate that she has been more depressed.  She also described increased anxiety.  She notes that she has a constant worry about everything.  Today provider conducted a GAD-7 and patient scored a 9.   Provider also conducted PHQ-9 the patient scored a 13.  Today she denies SI/HI, mania, or paranoia.   Patient has not had labs drawn in over 6 months.   Today provider ordered CBC, CMP, lipid panel, prolactin level, LFT, thyroid  panel, UDS, and HGBA1C.  Patient also referred to cardiology for EKG and cardiac workup.  Abilify  increased from 15 mg to 20 mg.  She will continue other medication as prescribed. At her last visit with Jade [name] a head CT was ordered to rule out organic causes for AVH. Provider awaiting results.  No other concerns at this time. Visit Diagnosis:    ICD-10-CM   1. Bipolar 2 disorder, major depressive episode ([name])  F31.81 buPROPion  (WELLBUTRIN  XL) 150 MG 24 hr tablet    traZODone  (DESYREL ) 150 MG tablet    gabapentin  (NEURONTIN ) 300 MG capsule    lamoTRIgine  (LAMICTAL ) 150 MG tablet    ARIPiprazole  (ABILIFY ) 20 MG tablet    CBC with Differential/Platelet    Prolactin    Ambulatory referral to Cardiology    Comprehensive Metabolic Panel (CMET)    Lipid panel    HgB A1c    Urine Drug Panel 7    Hepatic function panel    Thyroid  Panel With TSH    2. Anxiety  F41.9 traZODone  (DESYREL ) 150 MG tablet    gabapentin  (NEURONTIN ) 300 MG capsule           Past Psychiatric History: PTSD, Depression, Bipolar 2, Anxiety, and SI  Past Medical History:  Past Medical History:  Diagnosis Date   Abnormal Pap smear of cervix    Anxiety    Depression    Vitamin D  deficiency 04/2019    Past  Surgical History:  Procedure Laterality Date   CERVICAL BIOPSY  W/ LOOP ELECTRODE EXCISION  03-2013   CESAREAN SECTION     x 1   COLPOSCOPY     TUBAL LIGATION      Family Psychiatric History:  Mom- depression, anxiety  Family History:  Family History  Problem Relation Age of Onset   Hypertension Mother    Diabetes Mother    Pancreatic cancer Maternal Grandmother    Diabetes Maternal Grandmother    Colon cancer Maternal Grandmother    Hyperlipidemia Brother     Social History:  Social History   Socioeconomic History   Marital status: Single    Spouse name: Not on file   Number of children: 2   Years of  education: 14   Highest education level: Some college, no degree  Occupational History   Not on file  Tobacco Use   Smoking status: Never   Smokeless tobacco: Never  Vaping Use   Vaping status: Never Used  Substance and Sexual Activity   Alcohol use: No    Comment: socially    Drug use: No   Sexual activity: Not Currently    Birth control/protection: Surgical  Other Topics Concern   Not on file  Social History Narrative   Marital status: single; dating seriously x 7 years.      Children:  2 children (14, 6)      Lives: with two sons      Employment:  Conservation officer, nature at [employer]; first shift for 8 years.      Tobacco: none      Alcohol:  4-6 drinks per month      Drugs: none      Exercise:  Sporadic; twice weekly; walking      Seatbelt: 100%      Guns: none      Sexual activity:  Total partners = 6; history of Chlamydia in 2002; last STD screening 2016; males only   Social Drivers of Corporate investment banker Strain: Not on file  Food Insecurity: Low Risk  (12/12/2022)   Received from [facility]   Hunger Vital Sign    Within the past 12 months, you worried that your food would run out before you got money to buy more: Never true    Within the past 12 months, the food you bought just didn't last and you didn't have money to get more. : Never true  Transportation Needs: No Transportation Needs (12/12/2022)   Received from [facility]    In the past 12 months, has lack of reliable transportation kept you from medical appointments, meetings, work or from getting things needed for daily living? : No  Physical Activity: Not on file  Stress: Not on file  Social Connections: Not on file    Allergies: No Known Allergies  Metabolic Disorder Labs: Lab Results  Component Value Date   HGBA1C 6.3 (H) 08/31/2020   MPG 134 08/31/2020   MPG 120 (H) 11/17/2014   No results found for: PROLACTIN Lab Results  Component Value Date   CHOL 195 08/31/2020   TRIG 92  08/31/2020   HDL 49 08/31/2020   CHOLHDL 4.0 08/31/2020   VLDL 18 08/31/2020   LDLCALC 128 (H) 08/31/2020   LDLCALC 113 (H) 05/24/2019   Lab Results  Component Value Date   TSH 1.315 08/31/2020   TSH 0.572 05/24/2019    Therapeutic Level Labs: No results found for: LITHIUM No results found for:  VALPROATE No results found for: CBMZ  Current Medications: Current Outpatient Medications  Medication Sig Dispense Refill   ARIPiprazole  (ABILIFY ) 20 MG tablet Take 1 tablet (20 mg total) by mouth daily. 30 tablet 3   buPROPion  (WELLBUTRIN  XL) 150 MG 24 hr tablet TAKE 1 TABLET BY MOUTH EVERY DAY IN THE MORNING 90 tablet 3   diclofenac (VOLTAREN) 75 MG EC tablet Take 75 mg by mouth 2 (two) times daily.     fluconazole  (DIFLUCAN ) 200 MG tablet Take 1 tablet (200 mg total) by mouth every 3 (three) days. (Patient not taking: Reported on 09/26/2022) 3 tablet 2   gabapentin  (NEURONTIN ) 300 MG capsule Take 1 capsule (300 mg total) by mouth 3 (three) times daily. 90 capsule 3   HYDROcodone-acetaminophen  (NORCO) 10-325 MG tablet Take 1 tablet by mouth every 6 (six) hours as needed.     lamoTRIgine  (LAMICTAL ) 150 MG tablet Take 1 tablet (150 mg total) by mouth daily. For mood stabilization 30 tablet 3   Semaglutide -Weight Management (WEGOVY ) 2.4 MG/0.75ML SOAJ Inject 2.4 mg into the skin once a week. 3 mL 2   traZODone  (DESYREL ) 150 MG tablet TAKE 1 TABLET BY MOUTH EVERYDAY AT BEDTIME 90 tablet 1   Vitamin D , Ergocalciferol , (DRISDOL ) 1.25 MG (50000 UNIT) CAPS capsule Take 50,000 Units by mouth every 7 (seven) days.     No current facility-administered medications for this visit.     Musculoskeletal: Strength & Muscle Tone: within normal limits and Telehealth visit Gait & Station: normal, Telehealth visit Patient leans: N/A  Psychiatric Specialty Exam: Review of Systems  There were no vitals taken for this visit.There is no height or weight on file to calculate BMI.  General Appearance:  Well Groomed  Eye Contact:  Good  Speech:  Clear and Coherent and Normal Rate  Volume:  Normal  Mood:  Anxious and Depressed  Affect:  Appropriate and Congruent  Thought Process:  Coherent, Goal Directed, and NA  Orientation:  Full (Time, Place, and Person)  Thought Content: Logical and Hallucinations: Auditory Visual   Suicidal Thoughts:  No  Homicidal Thoughts:  No  Memory:  Immediate;   Good Recent;   Good Remote;   Good  Judgement:  Good  Insight:  Good  Psychomotor Activity:  Normal  Concentration:  Concentration: Good and Attention Span: Good  Recall:  Good  Fund of Knowledge: Good  Language: Good  Akathisia:  No  Handed:  Right  AIMS (if indicated): not done  Assets:  Communication Skills Desire for Improvement Financial Resources/Insurance Housing Leisure Time Social Support  ADL's:  Intact  Cognition: WNL  Sleep:  Fair   Screenings: GAD-7    Flowsheet Row Video Visit from 12/15/2023 in [facility] Video Visit from 08/04/2023 in [facility] Video Visit from 05/12/2023 in [facility] Video Visit from 03/17/2023 in [facility] Video Visit from 01/11/2023 in [facility]  Total GAD-7 Score 9 7 12 4 5    PHQ2-9    Flowsheet Row Video Visit from 12/15/2023 in [facility] Video Visit from 08/04/2023 in [facility] Video Visit from 05/12/2023 in [facility] Video Visit from 03/17/2023 in [facility] Video Visit from 01/11/2023 in [facility]  PHQ-2 Total Score 5 1 5 2  0  PHQ-9 Total Score 13 6 10 5 6    Flowsheet Row Video  Visit from 10/13/2022 in [facility] ED from 09/24/2022 in [facility] Video Visit from 08/04/2022 in  [facility]  C-SSRS RISK CATEGORY No Risk No Risk Error: Q7 should not be populated when Q6 is No     Assessment and Plan: Patient reports that she has been more anxious, depressed, and having VAH. Patient has not had labs drawn in over 6 months.  Today provider ordered CBC, CMP, lipid panel, prolactin level, LFT, thyroid  panel, UDS, and HGBA1C.  Patient also referred to cardiology for EKG and cardiac workup.  Abilify  increased from 15 mg to 20 mg.  She will continue other medication as prescribed. At her last visit with Jade [name] a head CT was ordered to rule out organic causes for AVH. Provider awaiting results.    1. Bipolar 2 disorder, major depressive episode ([name])  Continue- buPROPion  (WELLBUTRIN  XL) 150 MG 24 hr tablet; TAKE 1 TABLET BY MOUTH EVERY DAY IN THE MORNING  Dispense: 90 tablet; Refill: 3 Continue- traZODone  (DESYREL ) 150 MG tablet; TAKE 1 TABLET BY MOUTH EVERYDAY AT BEDTIME  Dispense: 90 tablet; Refill: 1 Continue- gabapentin  (NEURONTIN ) 300 MG capsule; Take 1 capsule (300 mg total) by mouth 3 (three) times daily.  Dispense: 90 capsule; Refill: 3 Continue- lamoTRIgine  (LAMICTAL ) 150 MG tablet; Take 1 tablet (150 mg total) by mouth daily. For mood stabilization  Dispense: 30 tablet; Refill: 3 Increased- ARIPiprazole  (ABILIFY ) 20 MG tablet; Take 1 tablet (20 mg total) by mouth daily.  Dispense: 30 tablet; Refill: 3 - CBC with Differential/Platelet; Future - Prolactin; Future - Ambulatory referral to Cardiology - Comprehensive Metabolic Panel (CMET); Future - Lipid panel; Future - HgB A1c; Future - Urine Drug Panel 7; Future - Hepatic function panel; Future - Thyroid  Panel With TSH; Future - Thyroid  Panel With TSH - Hepatic function panel - Urine Drug Panel 7 - HgB A1c - Lipid panel - Comprehensive Metabolic Panel (CMET) - Prolactin - CBC with Differential/Platelet  2. Anxiety  Continue- traZODone  (DESYREL ) 150 MG tablet; TAKE 1  TABLET BY MOUTH EVERYDAY AT BEDTIME  Dispense: 90 tablet; Refill: 1 Continue- gabapentin  (NEURONTIN ) 300 MG capsule; Take 1 capsule (300 mg total) by mouth 3 (three) times daily.  Dispense: 90 capsule; Refill: 3  Follow up in 3 months Follow up with therapy  Zane FORBES Bach, NP 12/15/2023, 8:30 AM

## 2023-12-19 ENCOUNTER — Ambulatory Visit (HOSPITAL_COMMUNITY): Payer: Self-pay | Admitting: Psychiatry

## 2023-12-19 LAB — COMPREHENSIVE METABOLIC PANEL WITH GFR
ALT: 14 IU/L (ref 0–32)
AST: 13 IU/L (ref 0–40)
Albumin: 4 g/dL (ref 3.9–4.9)
Alkaline Phosphatase: 71 IU/L (ref 41–116)
BUN/Creatinine Ratio: 12 (ref 9–23)
BUN: 11 mg/dL (ref 6–24)
Bilirubin Total: 0.3 mg/dL (ref 0.0–1.2)
CO2: 24 mmol/L (ref 20–29)
Calcium: 9 mg/dL (ref 8.7–10.2)
Chloride: 105 mmol/L (ref 96–106)
Creatinine, Ser: 0.93 mg/dL (ref 0.57–1.00)
Globulin, Total: 2.1 g/dL (ref 1.5–4.5)
Glucose: 75 mg/dL (ref 70–99)
Potassium: 4.3 mmol/L (ref 3.5–5.2)
Sodium: 143 mmol/L (ref 134–144)
Total Protein: 6.1 g/dL (ref 6.0–8.5)
eGFR: 77 mL/min/1.73 (ref >59)

## 2023-12-19 LAB — HEMOGLOBIN A1C
Est. average glucose Bld gHb Est-mCnc: 105 mg/dL
Hgb A1c MFr Bld: 5.3 % (ref 4.8–5.6)

## 2023-12-19 LAB — LIPID PANEL
Chol/HDL Ratio: 2.7 ratio (ref 0.0–4.4)
Cholesterol, Total: 162 mg/dL (ref 100–199)
HDL: 59 mg/dL (ref >39)
LDL Chol Calc (NIH): 92 mg/dL (ref 0–99)
Triglycerides: 56 mg/dL (ref 0–149)
VLDL Cholesterol Cal: 11 mg/dL (ref 5–40)

## 2023-12-19 LAB — CBC WITH DIFFERENTIAL/PLATELET
Basophils Absolute: 0 x10E3/uL (ref 0.0–0.2)
Basos: 0 %
EOS (ABSOLUTE): 0.1 x10E3/uL (ref 0.0–0.4)
Eos: 2 %
Hematocrit: 39.7 % (ref 34.0–46.6)
Hemoglobin: 12.9 g/dL (ref 11.1–15.9)
Immature Grans (Abs): 0 x10E3/uL (ref 0.0–0.1)
Immature Granulocytes: 0 %
Lymphocytes Absolute: 1.4 x10E3/uL (ref 0.7–3.1)
Lymphs: 38 %
MCH: 29.9 pg (ref 26.6–33.0)
MCHC: 32.5 g/dL (ref 31.5–35.7)
MCV: 92 fL (ref 79–97)
Monocytes Absolute: 0.2 x10E3/uL (ref 0.1–0.9)
Monocytes: 6 %
Neutrophils Absolute: 2 x10E3/uL (ref 1.4–7.0)
Neutrophils: 54 %
Platelets: 276 x10E3/uL (ref 150–450)
RBC: 4.32 x10E6/uL (ref 3.77–5.28)
RDW: 13 % (ref 11.7–15.4)
WBC: 3.6 x10E3/uL (ref 3.4–10.8)

## 2023-12-19 LAB — HEPATIC FUNCTION PANEL
ALT: 13 IU/L (ref 0–32)
AST: 10 IU/L (ref 0–40)
Albumin: 4.2 g/dL (ref 3.9–4.9)
Alkaline Phosphatase: 70 IU/L (ref 41–116)
Bilirubin Total: 0.3 mg/dL (ref 0.0–1.2)
Bilirubin, Direct: 0.11 mg/dL (ref 0.00–0.40)
Total Protein: 6.1 g/dL (ref 6.0–8.5)

## 2023-12-19 LAB — THYROID PANEL WITH TSH
Free Thyroxine Index: 1.7 (ref 1.2–4.9)
T3 Uptake Ratio: 24 % (ref 24–39)
T4, Total: 7.1 ug/dL (ref 4.5–12.0)
TSH: 0.339 u[IU]/mL — ABNORMAL LOW (ref 0.450–4.500)

## 2023-12-19 LAB — PROLACTIN: Prolactin: 8.5 ng/mL (ref 4.8–33.4)

## 2023-12-19 NOTE — Progress Notes (Signed)
 Provider called patient to discuss recent lab results.  No other concerns

## 2023-12-21 ENCOUNTER — Other Ambulatory Visit (HOSPITAL_COMMUNITY): Payer: Self-pay

## 2023-12-21 MED ORDER — WEGOVY 2.4 MG/0.75ML ~~LOC~~ SOAJ
0.7500 mL | SUBCUTANEOUS | 2 refills | Status: AC
  Filled 2023-12-21: qty 3, 28d supply, fill #0

## 2024-01-07 ENCOUNTER — Other Ambulatory Visit (HOSPITAL_COMMUNITY): Payer: Self-pay | Admitting: Psychiatry

## 2024-01-07 DIAGNOSIS — F3181 Bipolar II disorder: Secondary | ICD-10-CM

## 2024-02-14 ENCOUNTER — Telehealth (HOSPITAL_COMMUNITY): Payer: Self-pay | Admitting: Psychiatry

## 2024-02-14 NOTE — Telephone Encounter (Signed)
 Provider informed patient that paperwork had not been received.  Patient notes that she will bring updated paperwork in to be filled out.

## 2024-02-14 NOTE — Telephone Encounter (Signed)
 Patient called and was wondering if the paperwork she left at the beginning of October was ready to be picked up. She said it was [employer]. Her callback number is [phone]. Please advise. Thank you.

## 2024-03-04 ENCOUNTER — Telehealth (HOSPITAL_COMMUNITY): Payer: Self-pay | Admitting: Psychiatry

## 2024-03-04 NOTE — Telephone Encounter (Signed)
 Patient called and said that she needs a not stating that she is not working right now and is waiting on disability. [phone] is the best number to reach her.

## 2024-03-06 ENCOUNTER — Encounter (HOSPITAL_COMMUNITY): Payer: Self-pay | Admitting: Psychiatry

## 2024-03-06 NOTE — Telephone Encounter (Signed)
 Letter written.  Patient inform her that she can access it at in her MyChart.

## 2024-03-14 ENCOUNTER — Telehealth (INDEPENDENT_AMBULATORY_CARE_PROVIDER_SITE_OTHER): Payer: MEDICAID | Admitting: Psychiatry

## 2024-03-14 ENCOUNTER — Encounter (HOSPITAL_COMMUNITY): Payer: Self-pay | Admitting: Psychiatry

## 2024-03-14 DIAGNOSIS — F3181 Bipolar II disorder: Secondary | ICD-10-CM | POA: Diagnosis not present

## 2024-03-14 DIAGNOSIS — G259 Extrapyramidal and movement disorder, unspecified: Secondary | ICD-10-CM

## 2024-03-14 DIAGNOSIS — F419 Anxiety disorder, unspecified: Secondary | ICD-10-CM

## 2024-03-14 MED ORDER — BUPROPION HCL ER (XL) 150 MG PO TB24: ORAL_TABLET | ORAL | 3 refills | Status: DC

## 2024-03-14 MED ORDER — LAMOTRIGINE 150 MG PO TABS: 150.0000 mg | ORAL_TABLET | Freq: Every day | ORAL | 3 refills | Status: DC

## 2024-03-14 MED ORDER — GABAPENTIN 300 MG PO CAPS: 300.0000 mg | ORAL_CAPSULE | Freq: Three times a day (TID) | ORAL | 3 refills | Status: DC

## 2024-03-14 MED ORDER — TRAZODONE HCL 150 MG PO TABS: ORAL_TABLET | ORAL | 1 refills | Status: DC

## 2024-03-14 MED ORDER — ARIPIPRAZOLE 20 MG PO TABS: 20.0000 mg | ORAL_TABLET | Freq: Every day | ORAL | 2 refills | Status: DC

## 2024-03-14 MED ORDER — BENZTROPINE MESYLATE 0.5 MG PO TABS: 1.0000 mg | ORAL_TABLET | Freq: Two times a day (BID) | ORAL | 3 refills | Status: DC

## 2024-03-14 NOTE — Progress Notes (Signed)
 BH MD/PA/NP OP Progress Note Virtual Visit via Video Note  I connected with Jade [name] on 03/14/2024 at  3:00 PM EST by a video enabled telemedicine application and verified that I am speaking with the correct person using two identifiers.  Location: Patient: Home Provider: Clinic   I discussed the limitations of evaluation and management by telemedicine and the availability of in person appointments. The patient expressed understanding and agreed to proceed.  I provided 30 minutes of non-face-to-face time during this encounter.      03/14/2024 9:54 AM Jade [name]  MRN:  [id number]  Chief Complaint: My primary care doctor told me to tell you about my tremors  HPI: 48 year old female seen today for follow up psychiatry evaluation.  She has a psychiatric history of PTSD, Depression, Bipolar 2, Anxiety, and SI. She currently managed on Abilify  20 mg daily, Lamictal  150 mg daily, gabapentin  300 mg three  times daily, Wellbutrin  XL 150 daily, and Trazodone  150 mg nightly as needed. She notes that her medications are somewhat effective in managing her psychiatric conditions.    Today she was well groomed, pleasant, cooperative, engaged in conversation, and maintained eye contact. She informed clinical research associate that her primary care doctor requested that she speak to writer about her tremors.  Patient notes that she has been having abnormal muscle movements in her face and her hands.  Provider conducted an aims assessment and patient scored a 6.  Provider encouraged patient to come in at her next visit so she could be assessed more thoroughly in person.  She endorsed understanding and agreed.    Since her last visit she informed writer that her anxiety and depression are well-managed.  Today provider conducted a GAD-7 and patient scored a 3, at her last visit she scored a 9.  Provider also conducted PHQ-9 and patient scored a 2, her last visit she scored a 13.  She endorsed adequate sleep and  appetite.  Patient notes that she continues to be on Wegovy  and has lost 3 pounds since her last visit.  She informed clinical research associate that she has lost over 100 pounds since starting the medication.  She however notes that it would no longer be covered by her insurance in the new year and is hopeful that she can maintain her weight loss. Patient notes that since increasing Abilify  her AH has improved. She notes that occasionally she will her her children call her when she is in her home alone. She denies VH, paranoia, or mania.    Today patient agreeable to starting Cogentin  0.5 mg twice daily to help manage movements.  Provider informed patient that if ineffective Ingrezza or Austedo could be trialed.  She endorsed understanding and agreed.  She will continue other medication as prescribed.Potential side effects of medication and risks vs benefits of treatment vs non-treatment were explained and discussed. All questions were answered.  No other concerns at this time.   Visit Diagnosis:    ICD-10-CM   1. Extrapyramidal and movement disorder  G25.9 benztropine  (COGENTIN ) 0.5 MG tablet    2. Bipolar 2 disorder, major depressive episode ([name])  F31.81 lamoTRIgine  (LAMICTAL ) 150 MG tablet    ARIPiprazole  (ABILIFY ) 20 MG tablet    buPROPion  (WELLBUTRIN  XL) 150 MG 24 hr tablet    traZODone  (DESYREL ) 150 MG tablet    gabapentin  (NEURONTIN ) 300 MG capsule    3. Anxiety  F41.9 traZODone  (DESYREL ) 150 MG tablet    gabapentin  (NEURONTIN ) 300 MG capsule  Past Psychiatric History: PTSD, Depression, Bipolar 2, Anxiety, and SI  Past Medical History:  Past Medical History:  Diagnosis Date   Abnormal Pap smear of cervix    Anxiety    Depression    Vitamin D  deficiency 04/2019    Past Surgical History:  Procedure Laterality Date   CERVICAL BIOPSY  W/ LOOP ELECTRODE EXCISION  03-2013   CESAREAN SECTION     x 1   COLPOSCOPY     TUBAL LIGATION      Family Psychiatric History:  Mom-  depression, anxiety  Family History:  Family History  Problem Relation Age of Onset   Hypertension Mother    Diabetes Mother    Pancreatic cancer Maternal Grandmother    Diabetes Maternal Grandmother    Colon cancer Maternal Grandmother    Hyperlipidemia Brother     Social History:  Social History   Socioeconomic History   Marital status: Single    Spouse name: Not on file   Number of children: 2   Years of education: 14   Highest education level: Some college, no degree  Occupational History   Not on file  Tobacco Use   Smoking status: Never   Smokeless tobacco: Never  Vaping Use   Vaping status: Never Used  Substance and Sexual Activity   Alcohol use: No    Comment: socially    Drug use: No   Sexual activity: Not Currently    Birth control/protection: Surgical  Other Topics Concern   Not on file  Social History Narrative   Marital status: single; dating seriously x 7 years.      Children:  2 children (14, 6)      Lives: with two sons      Employment:  Conservation Officer, [employer] at [employer]; first shift for 8 years.      Tobacco: none      Alcohol:  4-6 drinks per month      Drugs: none      Exercise:  Sporadic; twice weekly; walking      Seatbelt: 100%      Guns: none      Sexual activity:  Total partners = 6; history of Chlamydia in 2002; last STD screening 2016; males only   Social Drivers of Health   Tobacco Use: Low Risk (03/14/2024)   Patient History    Smoking Tobacco Use: Never    Smokeless Tobacco Use: Never    Passive Exposure: Not on file  Financial Resource Strain: Not on file  Food Insecurity: Low Risk (12/12/2022)   Received from [facility]   Epic    Within the past 12 months, you worried that your food would run out before you got money to buy more: Never true    Within the past 12 months, the food you bought just didn't last and you didn't have money to get more. : Never true  Transportation Needs: No Transportation Needs (12/12/2022)   Received from [employer] [facility]    In the past 12 months, has lack of reliable transportation kept you from medical appointments, meetings, work or from getting things needed for daily living? : No  Physical Activity: Not on file  Stress: Not on file  Social Connections: Not on file  Depression (PHQ2-9): Low Risk (03/14/2024)   Depression (PHQ2-9)    PHQ-2 Score: 2  Recent Concern: Depression (PHQ2-9) - High Risk (12/15/2023)   Depression (PHQ2-9)    PHQ-2 Score: 13  Alcohol Screen: Not on file  Housing: Low Risk (12/12/2022)   Received from [facility]   Epic    What is your living situation today?: I have a steady place to live    Think about the place you live. Do you have problems with any of the following? Choose all that apply:: None/None on this list  Utilities: Low Risk (12/12/2022)   Received from [facility]   Utilities    In the past 12 months has the electric, gas, oil, or water company threatened to shut off services in your home? : No  Health Literacy: Not on file    Allergies: No Known Allergies  Metabolic Disorder Labs: Lab Results  Component Value Date   HGBA1C 5.3 12/18/2023   MPG 134 08/31/2020   MPG 120 (H) 11/17/2014   Lab Results  Component Value Date   PROLACTIN 8.5 12/18/2023   Lab Results  Component Value Date   CHOL 162 12/18/2023   TRIG 56 12/18/2023   HDL 59 12/18/2023   CHOLHDL 2.7 12/18/2023   VLDL 18 08/31/2020   LDLCALC 92 12/18/2023   LDLCALC 128 (H) 08/31/2020   Lab Results  Component Value Date   TSH 0.339 (L) 12/18/2023   TSH 1.315 08/31/2020    Therapeutic Level Labs: No results found for: LITHIUM No results found for: VALPROATE No results found for: CBMZ  Current Medications: Current Outpatient Medications  Medication Sig Dispense Refill   benztropine  (COGENTIN ) 0.5 MG tablet Take 2 tablets (1 mg total) by mouth 2 (two) times daily. 60 tablet 3   ARIPiprazole  (ABILIFY ) 20 MG tablet Take 1 tablet (20 mg total) by mouth  daily. 90 tablet 2   buPROPion  (WELLBUTRIN  XL) 150 MG 24 hr tablet TAKE 1 TABLET BY MOUTH EVERY DAY IN THE MORNING 90 tablet 3   diclofenac (VOLTAREN) 75 MG EC tablet Take 75 mg by mouth 2 (two) times daily.     fluconazole  (DIFLUCAN ) 200 MG tablet Take 1 tablet (200 mg total) by mouth every 3 (three) days. (Patient not taking: Reported on 09/26/2022) 3 tablet 2   gabapentin  (NEURONTIN ) 300 MG capsule Take 1 capsule (300 mg total) by mouth 3 (three) times daily. 90 capsule 3   HYDROcodone-acetaminophen  (NORCO) 10-325 MG tablet Take 1 tablet by mouth every 6 (six) hours as needed.     lamoTRIgine  (LAMICTAL ) 150 MG tablet Take 1 tablet (150 mg total) by mouth daily. For mood stabilization 30 tablet 3   semaglutide -weight management (WEGOVY ) 2.4 MG/0.75ML SOAJ SQ injection Inject 2.4 mg into the skin once a week. 3 mL 2   traZODone  (DESYREL ) 150 MG tablet TAKE 1 TABLET BY MOUTH EVERYDAY AT BEDTIME 90 tablet 1   Vitamin D , Ergocalciferol , (DRISDOL ) 1.25 MG (50000 UNIT) CAPS capsule Take 50,000 Units by mouth every 7 (seven) days.     No current facility-administered medications for this visit.     Musculoskeletal: Strength & Muscle Tone: within normal limits and Telehealth visit Gait & Station: normal, Telehealth visit Patient leans: N/A  Psychiatric Specialty Exam: Review of Systems  There were no vitals taken for this visit.There is no height or weight on file to calculate BMI.  General Appearance: Well Groomed  Eye Contact:  Good  Speech:  Clear and Coherent and Normal Rate  Volume:  Normal  Mood:  Euthymic  Affect:  Appropriate and Congruent  Thought Process:  Coherent, Goal Directed, and NA  Orientation:  Full (Time, Place, and Person)  Thought Content: Logical and Hallucinations: Auditory Improving  Suicidal Thoughts:  No  Homicidal Thoughts:  No  Memory:  Immediate;   Good Recent;   Good Remote;   Good  Judgement:  Good  Insight:  Good  Psychomotor Activity:  Normal   Concentration:  Concentration: Good and Attention Span: Good  Recall:  Good  Fund of Knowledge: Good  Language: Good  Akathisia:  No  Handed:  Right  AIMS (if indicated): not done  Assets:  Communication Skills Desire for Improvement Financial Resources/Insurance Housing Leisure Time Social Support  ADL's:  Intact  Cognition: WNL  Sleep:  Good   Screenings: AIMS    Flowsheet Row Video Visit from 03/14/2024 in [facility]  AIMS Total Score 6   GAD-7    Flowsheet Row Video Visit from 03/14/2024 in [facility] Video Visit from 12/15/2023 in [facility] Video Visit from 08/04/2023 in [facility] Video Visit from 05/12/2023 in [facility] Video Visit from 03/17/2023 in [facility]  Total GAD-7 Score 3 9 7 12 4    PHQ2-9    Flowsheet Row Video Visit from 03/14/2024 in [facility] Video Visit from 12/15/2023 in [facility] Video Visit from 08/04/2023 in [facility] Video Visit from 05/12/2023 in [facility] Video Visit from 03/17/2023 in [facility]  PHQ-2 Total Score 0 5 1 5 2   PHQ-9 Total Score 2 13 6 10 5    Flowsheet Row Video Visit from 10/13/2022 in [facility] ED from 09/24/2022 in [facility] Emergency Department at [facility] Video Visit from 08/04/2022 in [facility]  C-SSRS RISK CATEGORY No Risk No Risk Error: Q7 should not be populated when Q6 is No     Assessment and Plan: Patient reports that her anxiety, sleep, depression and VAH has improved since her last visit. She does complain of abnormal muscle movements. Today patient agreeable to starting Cogentin  0.5 mg twice daily to help manage movements.   Provider informed patient that if ineffective Ingrezza or Austedo could be trialed.  She endorsed understanding and agreed.  She will continue other medication as prescribed.   1. Bipolar 2 disorder, major depressive episode ([name])  Continue- lamoTRIgine  (LAMICTAL ) 150 MG tablet; Take 1 tablet (150 mg total) by mouth daily. For mood stabilization  Dispense: 30 tablet; Refill: 3 Continue- ARIPiprazole  (ABILIFY ) 20 MG tablet; Take 1 tablet (20 mg total) by mouth daily.  Dispense: 90 tablet; Refill: 2 Continue- buPROPion  (WELLBUTRIN  XL) 150 MG 24 hr tablet; TAKE 1 TABLET BY MOUTH EVERY DAY IN THE MORNING  Dispense: 90 tablet; Refill: 3 Continue- traZODone  (DESYREL ) 150 MG tablet; TAKE 1 TABLET BY MOUTH EVERYDAY AT BEDTIME  Dispense: 90 tablet; Refill: 1 Continue- gabapentin  (NEURONTIN ) 300 MG capsule; Take 1 capsule (300 mg total) by mouth 3 (three) times daily.  Dispense: 90 capsule; Refill: 3  2. Anxiety  Continue- traZODone  (DESYREL ) 150 MG tablet; TAKE 1 TABLET BY MOUTH EVERYDAY AT BEDTIME  Dispense: 90 tablet; Refill: 1 Continue- gabapentin  (NEURONTIN ) 300 MG capsule; Take 1 capsule (300 mg total) by mouth 3 (three) times daily.  Dispense: 90 capsule; Refill: 3  3. Extrapyramidal and movement disorder (Primary)  Start- benztropine  (COGENTIN ) 0.5 MG tablet; Take 2 tablets (1 mg total) by mouth 2 (two) times daily.  Dispense: 60 tablet; Refill: 3   Follow up  in 1 months Follow up with therapy  Zane FORBES Bach, NP 03/14/2024, 9:54 AM

## 2024-04-02 DIAGNOSIS — G259 Extrapyramidal and movement disorder, unspecified: Secondary | ICD-10-CM

## 2024-04-23 ENCOUNTER — Telehealth (INDEPENDENT_AMBULATORY_CARE_PROVIDER_SITE_OTHER): Payer: MEDICAID | Admitting: Psychiatry

## 2024-04-23 ENCOUNTER — Encounter (HOSPITAL_COMMUNITY): Payer: Self-pay | Admitting: Psychiatry

## 2024-04-23 DIAGNOSIS — F419 Anxiety disorder, unspecified: Secondary | ICD-10-CM | POA: Diagnosis not present

## 2024-04-23 DIAGNOSIS — F3181 Bipolar II disorder: Secondary | ICD-10-CM

## 2024-04-23 DIAGNOSIS — G259 Extrapyramidal and movement disorder, unspecified: Secondary | ICD-10-CM | POA: Diagnosis not present

## 2024-04-23 MED ORDER — GABAPENTIN 300 MG PO CAPS: 300.0000 mg | ORAL_CAPSULE | Freq: Three times a day (TID) | ORAL | 3 refills | Status: AC

## 2024-04-23 MED ORDER — BUPROPION HCL ER (XL) 150 MG PO TB24: ORAL_TABLET | ORAL | 3 refills | Status: AC

## 2024-04-23 MED ORDER — ARIPIPRAZOLE 20 MG PO TABS: 20.0000 mg | ORAL_TABLET | Freq: Every day | ORAL | 2 refills | Status: AC

## 2024-04-23 MED ORDER — BENZTROPINE MESYLATE 0.5 MG PO TABS: 1.0000 mg | ORAL_TABLET | Freq: Two times a day (BID) | ORAL | 1 refills | Status: AC

## 2024-04-23 MED ORDER — TRAZODONE HCL 150 MG PO TABS: ORAL_TABLET | ORAL | 1 refills | Status: AC

## 2024-04-23 MED ORDER — LAMOTRIGINE 150 MG PO TABS: 150.0000 mg | ORAL_TABLET | Freq: Every day | ORAL | 3 refills | Status: AC

## 2024-04-23 NOTE — Progress Notes (Signed)
 BH MD/PA/NP OP Progress Note Virtual Visit via Video Note  I connected with Jade [name] on 04/23/24 at  1:00 PM EST by a video enabled telemedicine application and verified that I am speaking with the correct person using two identifiers.  Location: Patient: Home Provider: Clinic   I discussed the limitations of evaluation and management by telemedicine and the availability of in person appointments. The patient expressed understanding and agreed to proceed.  I provided 30 minutes of non-face-to-face time during this encounter.      04/23/2024 1:16 PM Jade [name]  MRN:  [id number]  Chief Complaint: I still have tremors  HPI: 49 year old female seen today for follow up psychiatry evaluation.  She has a psychiatric history of PTSD, Depression, Bipolar 2, Anxiety, and SI. She currently managed on Abilify  20 mg daily, Lamictal  150 mg daily, gabapentin  300 mg three  times daily, Wellbutrin  XL 150 daily, and Trazodone  150 mg nightly as needed. She notes that her medications are somewhat effective in managing her psychiatric conditions.    Today she was well groomed, pleasant, cooperative, engaged in conversation, and maintained eye contact. She informed clinical research associate that she continues to have abnormal muscle movements in her mouth, tongue, and occasionally hands.  Patient was scheduled for in person appointment however due to unforseen circumstances and the weather provider was not able to be in the clinic.  Provider had rescheduled patient for February to readdress abnormal muscle movements.  Patient reports that she is not started Cogentin .  Provider informed patient that she could start the medication and still be reassessed in February.  She endorsed understanding and agreed.      Since her last visit she informed writer that her anxiety and depression are well-managed.  Today provider conducted a GAD-7 and patient scored a 4, at her last visit she scored a 3.  Provider also conducted PHQ-9  and patient scored a 3, her last visit she scored a 2.  She endorsed adequate sleep and appetite. Today she denies SI/HI/VAH, paranoia, or mania.    Today patient agreeable to starting Cogentin  0.5 mg twice daily to help manage movements.  Provider informed patient that if ineffective Ingrezza or Austedo could be trialed.  She endorsed understanding and agreed.  She will continue other medication as prescribed.Potential side effects of medication and risks vs benefits of treatment vs non-treatment were explained and discussed. All questions were answered.  No other concerns at this time.   Visit Diagnosis:    ICD-10-CM   1. Bipolar 2 disorder, major depressive episode ([name])  F31.81 buPROPion  (WELLBUTRIN  XL) 150 MG 24 hr tablet    traZODone  (DESYREL ) 150 MG tablet    lamoTRIgine  (LAMICTAL ) 150 MG tablet    ARIPiprazole  (ABILIFY ) 20 MG tablet    gabapentin  (NEURONTIN ) 300 MG capsule    2. Anxiety  F41.9 traZODone  (DESYREL ) 150 MG tablet    gabapentin  (NEURONTIN ) 300 MG capsule    3. Extrapyramidal and movement disorder  G25.9 benztropine  (COGENTIN ) 0.5 MG tablet             Past Psychiatric History: PTSD, Depression, Bipolar 2, Anxiety, and SI  Past Medical History:  Past Medical History:  Diagnosis Date   Abnormal Pap smear of cervix    Anxiety    Depression    Vitamin D  deficiency 04/2019    Past Surgical History:  Procedure Laterality Date   CERVICAL BIOPSY  W/ LOOP ELECTRODE EXCISION  03-2013   CESAREAN SECTION  x 1   COLPOSCOPY     TUBAL LIGATION      Family Psychiatric History:  Mom- depression, anxiety  Family History:  Family History  Problem Relation Age of Onset   Hypertension Mother    Diabetes Mother    Pancreatic cancer Maternal Grandmother    Diabetes Maternal Grandmother    Colon cancer Maternal Grandmother    Hyperlipidemia Brother     Social History:  Social History   Socioeconomic History   Marital status: Single    Spouse name: Not  on file   Number of children: 2   Years of education: 14   Highest education level: Some college, no degree  Occupational History   Not on file  Tobacco Use   Smoking status: Never   Smokeless tobacco: Never  Vaping Use   Vaping status: Never Used  Substance and Sexual Activity   Alcohol use: No    Comment: socially    Drug use: No   Sexual activity: Not Currently    Birth control/protection: Surgical  Other Topics Concern   Not on file  Social History Narrative   Marital status: single; dating seriously x 7 years.      Children:  2 children (14, 6)      Lives: with two sons      Employment:  Conservation Officer, [employer] at [employer]; first shift for 8 years.      Tobacco: none      Alcohol:  4-6 drinks per month      Drugs: none      Exercise:  Sporadic; twice weekly; walking      Seatbelt: 100%      Guns: none      Sexual activity:  Total partners = 6; history of Chlamydia in 2002; last STD screening 2016; males only   Social Drivers of Health   Tobacco Use: Low Risk (03/14/2024)   Patient History    Smoking Tobacco Use: Never    Smokeless Tobacco Use: Never    Passive Exposure: Not on file  Financial Resource Strain: Not on file  Food Insecurity: Low Risk (12/12/2022)   Received from [facility]   Epic    Within the past 12 months, you worried that your food would run out before you got money to buy more: Never true    Within the past 12 months, the food you bought just didn't last and you didn't have money to get more. : Never true  Transportation Needs: No Transportation Needs (12/12/2022)   Received from [facility]    In the past 12 months, has lack of reliable transportation kept you from medical appointments, meetings, work or from getting things needed for daily living? : No  Physical Activity: Not on file  Stress: Not on file  Social Connections: Not on file  Depression (PHQ2-9): Low Risk (04/23/2024)   Depression (PHQ2-9)    PHQ-2 Score: 3  Alcohol Screen:  Not on file  Housing: Low Risk (12/12/2022)   Received from [facility]   Epic    What is your living situation today?: I have a steady place to live    Think about the place you live. Do you have problems with any of the following? Choose all that apply:: None/None on this list  Utilities: Low Risk (12/12/2022)   Received from [facility]   Utilities    In the past 12 months has the electric, gas, oil, or water company threatened to shut off services  in your home? : No  Health Literacy: Not on file    Allergies: No Known Allergies  Metabolic Disorder Labs: Lab Results  Component Value Date   HGBA1C 5.3 12/18/2023   MPG 134 08/31/2020   MPG 120 (H) 11/17/2014   Lab Results  Component Value Date   PROLACTIN 8.5 12/18/2023   Lab Results  Component Value Date   CHOL 162 12/18/2023   TRIG 56 12/18/2023   HDL 59 12/18/2023   CHOLHDL 2.7 12/18/2023   VLDL 18 08/31/2020   LDLCALC 92 12/18/2023   LDLCALC 128 (H) 08/31/2020   Lab Results  Component Value Date   TSH 0.339 (L) 12/18/2023   TSH 1.315 08/31/2020    Therapeutic Level Labs: No results found for: LITHIUM No results found for: VALPROATE No results found for: CBMZ  Current Medications: Current Outpatient Medications  Medication Sig Dispense Refill   ARIPiprazole  (ABILIFY ) 20 MG tablet Take 1 tablet (20 mg total) by mouth daily. 90 tablet 2   benztropine  (COGENTIN ) 0.5 MG tablet Take 2 tablets (1 mg total) by mouth 2 (two) times daily. 360 tablet 1   buPROPion  (WELLBUTRIN  XL) 150 MG 24 hr tablet TAKE 1 TABLET BY MOUTH EVERY DAY IN THE MORNING 90 tablet 3   diclofenac (VOLTAREN) 75 MG EC tablet Take 75 mg by mouth 2 (two) times daily.     fluconazole  (DIFLUCAN ) 200 MG tablet Take 1 tablet (200 mg total) by mouth every 3 (three) days. (Patient not taking: Reported on 09/26/2022) 3 tablet 2   gabapentin  (NEURONTIN ) 300 MG capsule Take 1 capsule (300 mg total) by mouth 3 (three) times daily. 90 capsule 3    HYDROcodone-acetaminophen  (NORCO) 10-325 MG tablet Take 1 tablet by mouth every 6 (six) hours as needed.     lamoTRIgine  (LAMICTAL ) 150 MG tablet Take 1 tablet (150 mg total) by mouth daily. For mood stabilization 30 tablet 3   semaglutide -weight management (WEGOVY ) 2.4 MG/0.75ML SOAJ SQ injection Inject 2.4 mg into the skin once a week. 3 mL 2   traZODone  (DESYREL ) 150 MG tablet TAKE 1 TABLET BY MOUTH EVERYDAY AT BEDTIME 90 tablet 1   Vitamin D , Ergocalciferol , (DRISDOL ) 1.25 MG (50000 UNIT) CAPS capsule Take 50,000 Units by mouth every 7 (seven) days.     No current facility-administered medications for this visit.     Musculoskeletal: Strength & Muscle Tone: within normal limits and Telehealth visit Gait & Station: normal, Telehealth visit Patient leans: N/A  Psychiatric Specialty Exam: Review of Systems  There were no vitals taken for this visit.There is no height or weight on file to calculate BMI.  General Appearance: Well Groomed  Eye Contact:  Good  Speech:  Clear and Coherent and Normal Rate  Volume:  Normal  Mood:  Euthymic  Affect:  Appropriate and Congruent  Thought Process:  Coherent, Goal Directed, and NA  Orientation:  Full (Time, Place, and Person)  Thought Content: WDL and Logical   Suicidal Thoughts:  No  Homicidal Thoughts:  No  Memory:  Immediate;   Good Recent;   Good Remote;   Good  Judgement:  Good  Insight:  Good  Psychomotor Activity:  Normal  Concentration:  Concentration: Good and Attention Span: Good  Recall:  Good  Fund of Knowledge: Good  Language: Good  Akathisia:  No  Handed:  Right  AIMS (if indicated): not done  Assets:  Communication Skills Desire for Improvement Financial Resources/Insurance Housing Leisure Time Social Support  ADL's:  Intact  Cognition: WNL  Sleep:  Good   Screenings: AIMS    Flowsheet Row Video Visit from 03/14/2024 in [facility]  AIMS Total Score 6   GAD-7    Flowsheet Row  Video Visit from 04/23/2024 in [facility] Video Visit from 03/14/2024 in [facility] Video Visit from 12/15/2023 in [facility] Video Visit from 08/04/2023 in [facility] Video Visit from 05/12/2023 in [facility]  Total GAD-7 Score 4 3 9 7 12    PHQ2-9    Flowsheet Row Video Visit from 04/23/2024 in [facility] Video Visit from 03/14/2024 in [facility] Video Visit from 12/15/2023 in [facility] Video Visit from 08/04/2023 in [facility] Video Visit from 05/12/2023 in [facility]  PHQ-2 Total Score 2 0 5 1 5   PHQ-9 Total Score 3 2 13 6 10    Flowsheet Row Video Visit from 10/13/2022 in [facility] from 09/24/2022 in [facility] Video Visit from 08/04/2022 in [facility]  C-SSRS RISK CATEGORY No Risk No Risk Error: Q7 should not be populated when Q6 is No     Assessment and Plan: Patient reports that her anxiety, sleep, depression and VAH has improved since her last visit. She does complain of abnormal muscle movements. Today patient agreeable to starting Cogentin  0.5 mg twice daily to help manage movements.  Provider informed patient that if ineffective Ingrezza or Austedo could be trialed.  She endorsed understanding and agreed.  She will continue other medication as prescribed.   1. Bipolar 2 disorder, major depressive episode ([name])  Continue- lamoTRIgine  (LAMICTAL ) 150 MG tablet; Take 1 tablet (150 mg total) by mouth daily. For mood stabilization  Dispense: 30 tablet; Refill: 3 Continue- ARIPiprazole  (ABILIFY ) 20 MG tablet; Take 1 tablet (20 mg total) by mouth daily.  Dispense: 90 tablet; Refill: 2 Continue- buPROPion   (WELLBUTRIN  XL) 150 MG 24 hr tablet; TAKE 1 TABLET BY MOUTH EVERY DAY IN THE MORNING  Dispense: 90 tablet; Refill: 3 Continue- traZODone  (DESYREL ) 150 MG tablet; TAKE 1 TABLET BY MOUTH EVERYDAY AT BEDTIME  Dispense: 90 tablet; Refill: 1 Continue- gabapentin  (NEURONTIN ) 300 MG capsule; Take 1 capsule (300 mg total) by mouth 3 (three) times daily.  Dispense: 90 capsule; Refill: 3  2. Anxiety  Continue- traZODone  (DESYREL ) 150 MG tablet; TAKE 1 TABLET BY MOUTH EVERYDAY AT BEDTIME  Dispense: 90 tablet; Refill: 1 Continue- gabapentin  (NEURONTIN ) 300 MG capsule; Take 1 capsule (300 mg total) by mouth 3 (three) times daily.  Dispense: 90 capsule; Refill: 3  3. Extrapyramidal and movement disorder (Primary)  Start- benztropine  (COGENTIN ) 0.5 MG tablet; Take 2 tablets (1 mg total) by mouth 2 (two) times daily.  Dispense: 60 tablet; Refill: 3   Follow up in 1 months Follow up with therapy  Zane FORBES Bach, NP 04/23/2024, 1:16 PM

## 2024-05-09 ENCOUNTER — Encounter (HOSPITAL_COMMUNITY): Payer: MEDICAID | Admitting: Psychiatry
